# Patient Record
Sex: Female | Born: 1970
Health system: Southern US, Community
[De-identification: ages and names within clinical notes are randomized; demographics above are authoritative.]

## PROBLEM LIST (undated history)

## (undated) DIAGNOSIS — J45909 Unspecified asthma, uncomplicated: Secondary | ICD-10-CM

## (undated) DIAGNOSIS — M65342 Trigger finger, left ring finger: Secondary | ICD-10-CM

## (undated) DIAGNOSIS — J4520 Mild intermittent asthma, uncomplicated: Secondary | ICD-10-CM

## (undated) DIAGNOSIS — G5601 Carpal tunnel syndrome, right upper limb: Secondary | ICD-10-CM

## (undated) DIAGNOSIS — H6990 Unspecified Eustachian tube disorder, unspecified ear: Secondary | ICD-10-CM

## (undated) DIAGNOSIS — G5602 Carpal tunnel syndrome, left upper limb: Secondary | ICD-10-CM

## (undated) DIAGNOSIS — E039 Hypothyroidism, unspecified: Secondary | ICD-10-CM

## (undated) DIAGNOSIS — L309 Dermatitis, unspecified: Secondary | ICD-10-CM

## (undated) DIAGNOSIS — F419 Anxiety disorder, unspecified: Secondary | ICD-10-CM

## (undated) HISTORY — DX: Mild intermittent asthma, uncomplicated: J45.20

## (undated) HISTORY — DX: Carpal tunnel syndrome, right upper limb: G56.01

## (undated) HISTORY — PX: WISDOM TOOTH EXTRACTION: SHX21

## (undated) HISTORY — DX: Unspecified asthma, uncomplicated: J45.909

## (undated) HISTORY — DX: Dermatitis, unspecified: L30.9

## (undated) HISTORY — DX: Unspecified eustachian tube disorder, unspecified ear: H69.90

## (undated) HISTORY — DX: Anxiety disorder, unspecified: F41.9

## (undated) HISTORY — DX: Carpal tunnel syndrome, left upper limb: G56.02

## (undated) HISTORY — DX: Trigger finger, left ring finger: M65.342

---

## 2011-05-19 ENCOUNTER — Ambulatory Visit: Payer: Self-pay | Admitting: Orthopedic Surgery

## 2014-10-30 LAB — HM PAP SMEAR: HM PAP: NEGATIVE

## 2014-11-10 ENCOUNTER — Ambulatory Visit: Payer: Self-pay | Admitting: Family Medicine

## 2014-11-10 LAB — HM MAMMOGRAPHY: HM Mammogram: NORMAL (ref 0–4)

## 2016-04-03 DIAGNOSIS — J309 Allergic rhinitis, unspecified: Secondary | ICD-10-CM | POA: Insufficient documentation

## 2016-04-03 DIAGNOSIS — R000 Tachycardia, unspecified: Secondary | ICD-10-CM | POA: Insufficient documentation

## 2016-04-03 DIAGNOSIS — L309 Dermatitis, unspecified: Secondary | ICD-10-CM | POA: Insufficient documentation

## 2016-04-03 DIAGNOSIS — F410 Panic disorder [episodic paroxysmal anxiety] without agoraphobia: Secondary | ICD-10-CM | POA: Insufficient documentation

## 2016-04-05 ENCOUNTER — Ambulatory Visit: Payer: Self-pay | Admitting: Unknown Physician Specialty

## 2016-04-05 ENCOUNTER — Telehealth: Payer: Self-pay | Admitting: Family Medicine

## 2016-04-05 NOTE — Telephone Encounter (Signed)
Routing to provider  

## 2016-04-05 NOTE — Telephone Encounter (Signed)
Patient notified, she said she didn't want to go to urgent care or the ed. I offered her an appointment with Dr. Wynetta Emery for next week but she refused and hung up on me.

## 2016-04-05 NOTE — Telephone Encounter (Signed)
Patient called thinking she is having anxiety and cant calm down. Told patient that there isn't anything available today but she insisted for me to send message to Dr. Wynetta Emery so she can work her in also give me a call [phone], thanks.

## 2016-04-05 NOTE — Telephone Encounter (Signed)
Spoke with pt who called to let us know she went to urgent care and they sent her to cardiology and Dr Saralyn Pilar gave her alprazolam .25mg  to take once a day for sleep.

## 2016-04-05 NOTE — Telephone Encounter (Signed)
I have not seen her since 10/2014- I don't think I have anything today, we can try to work her in as able. If she is that anxious, she should probably go to an urgent care as we can't give her anything here anyway, we can only give Rxs.

## 2016-04-06 ENCOUNTER — Ambulatory Visit: Payer: Self-pay | Admitting: Family Medicine

## 2016-04-10 ENCOUNTER — Ambulatory Visit (INDEPENDENT_AMBULATORY_CARE_PROVIDER_SITE_OTHER): Payer: BLUE CROSS/BLUE SHIELD | Admitting: Family Medicine

## 2016-04-10 ENCOUNTER — Encounter: Payer: Self-pay | Admitting: Family Medicine

## 2016-04-10 VITALS — BP 135/81 | HR 108 | Temp 98.9°F | Ht 60.25 in | Wt 159.0 lb

## 2016-04-10 DIAGNOSIS — F419 Anxiety disorder, unspecified: Secondary | ICD-10-CM | POA: Insufficient documentation

## 2016-04-10 DIAGNOSIS — F410 Panic disorder [episodic paroxysmal anxiety] without agoraphobia: Secondary | ICD-10-CM

## 2016-04-10 MED ORDER — ALPRAZOLAM 0.25 MG PO TABS: ORAL_TABLET | ORAL | Status: DC

## 2016-04-10 MED ORDER — VENLAFAXINE HCL ER 37.5 MG PO CP24: 37.5000 mg | ORAL_CAPSULE | Freq: Every day | ORAL | Status: DC

## 2016-04-10 NOTE — Progress Notes (Signed)
BP 135/81 mmHg  Pulse 108  Temp(Src) 98.9 F (37.2 C)  Ht 5' 0.25" (1.53 m)  Wt 159 lb (72.122 kg)  BMI 30.81 kg/m2  SpO2 100%  LMP 03/20/2016 (Exact Date)   Subjective:    Patient ID: Sherry Gonzalez, female    DOB: October 18, 1971, 45 y.o.   MRN: [id number]  HPI: Sherry Gonzalez is a 45 y.o. female  Chief Complaint  Patient presents with  . Follow-up  . Panic Attack    2 panic attacks in the last 2 weeks   URGENT CARE FOLLOW UP Time since discharge: 12 days Hospital/facility: [facility] Diagnosis: Presyncope, tachycardia, anxiety Procedures/tests: EKG- short PR interval, Stress test and Holter monitor Consultants: Cardiology New medications: xanax Discharge instructions:  Follow up with cardiology in 2 weeks after Holter and Stress test Status: better  ANXIETY/STRESS- panic attacks that last about 30 seconds and her heart rate goes into the 90s with lightheadedness and elevated HR. Has been undergoing studies with cardiology including stress test and holter, was given xanax by cardiology which she says helped a lot, but it was very strong for her. Taking it very occasionally. Thinks that the fitbit she was wearing might have gotten her more anxious and made her feel worse. Has been living with her brother and that has been stressing her out quite a bit. Had another panic attack when she put the fit bit back on.  Duration: 2 weeks Anxious mood: yes  Excessive worrying: yes Irritability: no  Sweating: no Nausea: no Palpitations:yes Hyperventilation: yes Panic attacks: yes Agoraphobia: no  Obscessions/compulsions: no Depressed mood: no Depression screen [facility] 2/9 04/10/2016  Decreased Interest 0  Down, Depressed, Hopeless 0  PHQ - 2 Score 0   GAD 7 : Generalized Anxiety Score 04/10/2016  Nervous, Anxious, on Edge 3  Control/stop worrying 2  Worry too much - different things 2  Trouble relaxing 2  Restless 0  Easily annoyed or irritable 2  Afraid - awful might  happen 2  Total GAD 7 Score 13  Anxiety Difficulty Somewhat difficult   Anhedonia: no Weight changes: no Insomnia: yes hard to fall asleep  Hypersomnia: no Fatigue/loss of energy: yes Feelings of worthlessness: no Feelings of guilt: no Impaired concentration/indecisiveness: no Suicidal ideations: no  Crying spells: yes Recent Stressors/Life Changes: yes   Relationship problems: no   Family stress: yes     Financial stress: no    Job stress: yes    Recent death/loss: no  Relevant past medical, surgical, family and social history reviewed and updated as indicated. Interim medical history since our last visit reviewed. Allergies and medications reviewed and updated.  Review of Systems  Constitutional: Negative.   Respiratory: Negative.   Cardiovascular: Negative.   Musculoskeletal: Negative.   Psychiatric/Behavioral: Positive for confusion, sleep disturbance and agitation. Negative for suicidal ideas, hallucinations, behavioral problems, self-injury, dysphoric mood and decreased concentration. The patient is nervous/anxious. The patient is not hyperactive.     Per HPI unless specifically indicated above     Objective:    BP 135/81 mmHg  Pulse 108  Temp(Src) 98.9 F (37.2 C)  Ht 5' 0.25" (1.53 m)  Wt 159 lb (72.122 kg)  BMI 30.81 kg/m2  SpO2 100%  LMP 03/20/2016 (Exact Date)  Wt Readings from Last 3 Encounters:  04/10/16 159 lb (72.122 kg)  10/30/14 175 lb (79.379 kg)    Physical Exam  Constitutional: She is oriented to person, place, and time. She appears well-developed and well-nourished. No  distress.  HENT:  Head: Normocephalic and atraumatic.  Right Ear: Hearing normal.  Left Ear: Hearing normal.  Nose: Nose normal.  Eyes: Conjunctivae and lids are normal. Right eye exhibits no discharge. Left eye exhibits no discharge. No scleral icterus.  Cardiovascular: Normal rate, regular rhythm, normal heart sounds and intact distal pulses.  Exam reveals no gallop and  no friction rub.   No murmur heard. Pulmonary/Chest: Effort normal and breath sounds normal. No respiratory distress. She has no wheezes. She has no rales. She exhibits no tenderness.  Musculoskeletal: Normal range of motion.  Neurological: She is alert and oriented to person, place, and time.  Skin: Skin is warm, dry and intact. No rash noted. No erythema. No pallor.  Psychiatric: She has a normal mood and affect. Her speech is normal and behavior is normal. Judgment and thought content normal. Cognition and memory are normal.  Nursing note and vitals reviewed.   Results for orders placed or performed in visit on 03/30/16  HM MAMMOGRAPHY  Result Value Ref Range   HM Mammogram Self Reported Normal 0-4 Bi-Rad, Self Reported Normal  HM PAP SMEAR  Result Value Ref Range   HM Pap smear neg HPV       Assessment & Plan:   Problem List Items Addressed This Visit      Other   Anxiety disorder - Primary    She doesn't really like how the xanax makes her feel. Will start her on effexor for anxiety- low dose to start and recheck in 4 weeks. Rx for xanax given today to get her through the next month. Will use it as a bridge until better controlled with the effexor. Call with any concerns.           Follow up plan: Return in about 4 weeks (around 05/08/2016).

## 2016-04-10 NOTE — Assessment & Plan Note (Signed)
She doesn't really like how the xanax makes her feel. Will start her on effexor for anxiety- low dose to start and recheck in 4 weeks. Rx for xanax given today to get her through the next month. Will use it as a bridge until better controlled with the effexor. Call with any concerns.

## 2016-04-12 ENCOUNTER — Telehealth: Payer: Self-pay | Admitting: Family Medicine

## 2016-04-12 NOTE — Telephone Encounter (Signed)
Routing to provider for advice.

## 2016-04-12 NOTE — Telephone Encounter (Signed)
It can be the first couple of days, every one responds differently. Go ahead and take it in the AM. Thanks.

## 2016-04-12 NOTE — Telephone Encounter (Signed)
Called patient with Dr. Durenda Age recommendation to start taking in the morning. Patient will try that and let us know if not tolerated after a couple of weeks.

## 2016-04-12 NOTE — Telephone Encounter (Signed)
Pt called would like a call back from Dr. Wynetta Emery concerning a medication the pt is on (Venlafaxine). Please call pt has questions about directions. Pt stated she has been taking the medication at night and it is wiring her up. Wants to know if this is normal?

## 2016-04-14 ENCOUNTER — Telehealth: Payer: Self-pay

## 2016-04-14 MED ORDER — VENLAFAXINE HCL 25 MG PO TABS: ORAL_TABLET | ORAL | Status: DC

## 2016-04-14 NOTE — Telephone Encounter (Signed)
Patient called, she has been taking the Venlafaxine in the morning for the past two days, she is still unable to sleep at night. She would like advice, should she continue to take it and see what happens or if there is something else that she can be put on?  [facility]

## 2016-04-14 NOTE — Telephone Encounter (Signed)
Stop that one. i've sent through a lower dose, but she will need to take it 2x a day. If it's still not letting her sleep, let me know and we'll change to something else.

## 2016-04-18 ENCOUNTER — Telehealth: Payer: Self-pay | Admitting: Family Medicine

## 2016-04-18 MED ORDER — SERTRALINE HCL 50 MG PO TABS: ORAL_TABLET | ORAL | Status: DC

## 2016-04-18 NOTE — Telephone Encounter (Signed)
I sent through zoloft to her pharmacy. Let me know if she has any problems with it.

## 2016-04-18 NOTE — Telephone Encounter (Signed)
Pt returning call

## 2016-04-18 NOTE — Telephone Encounter (Signed)
Patient notified

## 2016-04-18 NOTE — Telephone Encounter (Signed)
Patient is concerned that she is still not going to sleep, she would like to know if she could try Zoloft?

## 2016-05-02 ENCOUNTER — Encounter: Payer: Self-pay | Admitting: Family Medicine

## 2016-05-02 ENCOUNTER — Ambulatory Visit (INDEPENDENT_AMBULATORY_CARE_PROVIDER_SITE_OTHER): Payer: BLUE CROSS/BLUE SHIELD | Admitting: Family Medicine

## 2016-05-02 VITALS — BP 143/84 | HR 111 | Temp 97.8°F | Wt 156.0 lb

## 2016-05-02 DIAGNOSIS — H9192 Unspecified hearing loss, left ear: Secondary | ICD-10-CM | POA: Insufficient documentation

## 2016-05-02 DIAGNOSIS — F410 Panic disorder [episodic paroxysmal anxiety] without agoraphobia: Secondary | ICD-10-CM | POA: Diagnosis not present

## 2016-05-02 DIAGNOSIS — R030 Elevated blood-pressure reading, without diagnosis of hypertension: Secondary | ICD-10-CM | POA: Diagnosis not present

## 2016-05-02 DIAGNOSIS — IMO0001 Reserved for inherently not codable concepts without codable children: Secondary | ICD-10-CM

## 2016-05-02 NOTE — Assessment & Plan Note (Signed)
Will refer to ENT. Referral generated today.

## 2016-05-02 NOTE — Assessment & Plan Note (Signed)
Still not doing well. Becoming very anxious with medications, concerned about side effects. Will continue her on PRN low dose xanax a this time and will get her into a counselor. Hold on medicine at this time. Will continue to monitor closely.

## 2016-05-02 NOTE — Progress Notes (Signed)
BP 143/84 mmHg  Pulse 111  Temp(Src) 97.8 F (36.6 C)  Wt 156 lb (70.761 kg)  SpO2 100%  LMP 04/20/2016 (Approximate)   Subjective:    Patient ID: Sherry Gonzalez, female    DOB: 10/30/1971, 45 y.o.   MRN: [id number]  HPI: Sherry Gonzalez is a 45 y.o. female  Chief Complaint  Patient presents with  . Anxiety    Patient states that she has been having bad side effects from Effexor and Zoloft, nausea, sweats, dizziness,SOB  . Hearing Problem    Patient states that she is still having trouble with her left ear, not being able to hear very well.   ANXIETY/STRESS- did not tolerate the zoloft. States that it made her anxious and nauseous ans gave her sweats and dizzy. She took 2 doses and did not repeat it. When she was taking the effexor it wired her up and made her heart race and her unable to sleep. No better with taking it in the AM or taking a lower dose. She took the 25mg  of the zoloft when she took it. Has not been taking any medication at this time besides her xanax Duration:uncontrolled Anxious mood: yes  Excessive worrying: yes Irritability: yes  Sweating: yes Nausea: yes Palpitations:yes Hyperventilation: no Panic attacks: no Agoraphobia: no  Obscessions/compulsions: no Depressed mood: no Depression screen [facility] 2/9 05/02/2016 04/10/2016  Decreased Interest 0 0  Down, Depressed, Hopeless 0 0  PHQ - 2 Score 0 0  Altered sleeping 3 -  Tired, decreased energy 3 -  Change in appetite 2 -  Feeling bad or failure about yourself  0 -  Trouble concentrating 0 -  Moving slowly or fidgety/restless 0 -  Suicidal thoughts 0 -  PHQ-9 Score 8 -  Difficult doing work/chores Somewhat difficult -   GAD 7 : Generalized Anxiety Score 05/02/2016 04/10/2016  Nervous, Anxious, on Edge 3 3  Control/stop worrying 0 2  Worry too much - different things 0 2  Trouble relaxing 2 2  Restless 0 0  Easily annoyed or irritable 0 2  Afraid - awful might happen 2 2  Total GAD 7 Score 7 13  Anxiety  Difficulty Very difficult Somewhat difficult   Anhedonia: yes Weight changes: no Insomnia: no hard to stay asleep  Hypersomnia: no Fatigue/loss of energy: no Feelings of worthlessness: no Feelings of guilt: no Impaired concentration/indecisiveness: no Suicidal ideations: no  Crying spells: no Recent Stressors/Life Changes: no   Relationship problems: no   Family stress: no     Financial stress: no    Job stress: no    Recent death/loss: no  Has not been able to hear out of her L ear since 2011, but has not gotten any better and she would like to get it taken care of and checked out. Had history of cerumen impaction. Has history of tubes in her ears and repeated ear infections in the past. Saw ENT 4-5 years ago, and it was OK at that time. Feels clogged most of the time. Hasn't be able to get it to pop. Is wondering if this is what's causing some of her dizziness.   Relevant past medical, surgical, family and social history reviewed and updated as indicated. Interim medical history since our last visit reviewed. Allergies and medications reviewed and updated.  Review of Systems  Constitutional: Negative.   HENT: Positive for hearing loss. Negative for congestion, dental problem, drooling, ear discharge, ear pain, facial swelling, mouth sores, nosebleeds, postnasal drip, rhinorrhea, sinus  pressure, sneezing, sore throat, tinnitus, trouble swallowing and voice change.   Respiratory: Negative.   Cardiovascular: Negative.   Psychiatric/Behavioral: Positive for sleep disturbance and agitation. Negative for suicidal ideas, hallucinations, behavioral problems, confusion, self-injury, dysphoric mood and decreased concentration. The patient is nervous/anxious. The patient is not hyperactive.    Per HPI unless specifically indicated above     Objective:    BP 143/84 mmHg  Pulse 111  Temp(Src) 97.8 F (36.6 C)  Wt 156 lb (70.761 kg)  SpO2 100%  LMP 04/20/2016 (Approximate)  Wt Readings  from Last 3 Encounters:  05/02/16 156 lb (70.761 kg)  04/10/16 159 lb (72.122 kg)  10/30/14 175 lb (79.379 kg)     Hearing Screening   125Hz  250Hz  500Hz  1000Hz  2000Hz  4000Hz  8000Hz   Right ear:   Pass Pass Pass Pass   Left ear:   Fail Fail Fail Fail    Physical Exam  Constitutional: She is oriented to person, place, and time. She appears well-developed and well-nourished. No distress.  HENT:  Head: Normocephalic and atraumatic.  Right Ear: Hearing normal.  Left Ear: Hearing normal.  Nose: Nose normal.  Eyes: Conjunctivae and lids are normal. Right eye exhibits no discharge. Left eye exhibits no discharge. No scleral icterus.  Cardiovascular: Normal rate, regular rhythm, normal heart sounds and intact distal pulses.  Exam reveals no gallop and no friction rub.   No murmur heard. Pulmonary/Chest: Effort normal and breath sounds normal. No respiratory distress. She has no wheezes. She has no rales. She exhibits no tenderness.  Musculoskeletal: Normal range of motion.  Neurological: She is alert and oriented to person, place, and time.  Skin: Skin is warm, dry and intact. No rash noted. No erythema. No pallor.  Psychiatric: Her speech is normal and behavior is normal. Judgment and thought content normal. Her mood appears anxious. Cognition and memory are normal.  Nursing note and vitals reviewed.   Results for orders placed or performed in visit on 03/30/16  HM MAMMOGRAPHY  Result Value Ref Range   HM Mammogram Self Reported Normal 0-4 Bi-Rad, Self Reported Normal  HM PAP SMEAR  Result Value Ref Range   HM Pap smear neg HPV       Assessment & Plan:   Problem List Items Addressed This Visit      Other   Anxiety disorder - Primary    Still not doing well. Becoming very anxious with medications, concerned about side effects. Will continue her on PRN low dose xanax a this time and will get her into a counselor. Hold on medicine at this time. Will continue to monitor closely.        Hearing loss in left ear    Will refer to ENT. Referral generated today.      Relevant Orders   Ambulatory referral to ENT    Other Visit Diagnoses    Elevated BP        Anxious today. Work on breathing. Treat anxiety. Call with any concerns.         Follow up plan: Return 3-4 weeks, for Follow up on mood.

## 2016-05-08 ENCOUNTER — Ambulatory Visit: Payer: BLUE CROSS/BLUE SHIELD | Admitting: Family Medicine

## 2016-05-22 ENCOUNTER — Other Ambulatory Visit: Payer: Self-pay | Admitting: Family Medicine

## 2016-05-22 NOTE — Telephone Encounter (Signed)
RX called in .

## 2016-05-22 NOTE — Telephone Encounter (Signed)
Please call in [place], Thanks!

## 2016-05-29 ENCOUNTER — Ambulatory Visit (INDEPENDENT_AMBULATORY_CARE_PROVIDER_SITE_OTHER): Payer: BLUE CROSS/BLUE SHIELD | Admitting: Family Medicine

## 2016-05-29 ENCOUNTER — Encounter: Payer: Self-pay | Admitting: Family Medicine

## 2016-05-29 VITALS — BP 131/75 | HR 108 | Temp 97.9°F | Ht 60.25 in | Wt 157.0 lb

## 2016-05-29 DIAGNOSIS — F410 Panic disorder [episodic paroxysmal anxiety] without agoraphobia: Secondary | ICD-10-CM | POA: Diagnosis not present

## 2016-05-29 NOTE — Progress Notes (Signed)
BP 131/75 mmHg  Pulse 108  Temp(Src) 97.9 F (36.6 C)  Ht 5' 0.25" (1.53 m)  Wt 157 lb (71.215 kg)  BMI 30.42 kg/m2  SpO2 97%  LMP 05/19/2016 (Approximate)   Subjective:    Patient ID: Sherry Gonzalez, female    DOB: 1971/03/02, 45 y.o.   MRN: [id number]  HPI: Sherry Gonzalez is a 45 y.o. female  Chief Complaint  Patient presents with  . Depression   ANXIETY/STRESS- has not seen anyone yet. Going to check and see who she wants to see and will give them a try.  Duration:better but still not under control wants to see counselor Anxious mood: yes  Excessive worrying: yes Irritability: no  Sweating: no Nausea: no Palpitations:no Hyperventilation: no Panic attacks: yes Agoraphobia: no  Obscessions/compulsions: no Depressed mood: no Depression screen [facility] 2/9 05/29/2016 05/02/2016 04/10/2016  Decreased Interest 0 0 0  Down, Depressed, Hopeless 0 0 0  PHQ - 2 Score 0 0 0  Altered sleeping 1 3 -  Tired, decreased energy 0 3 -  Change in appetite 0 2 -  Feeling bad or failure about yourself  0 0 -  Trouble concentrating 0 0 -  Moving slowly or fidgety/restless 0 0 -  Suicidal thoughts 0 0 -  PHQ-9 Score 1 8 -  Difficult doing work/chores Somewhat difficult Somewhat difficult -   GAD 7 : Generalized Anxiety Score 05/29/2016 05/02/2016 04/10/2016  Nervous, Anxious, on Edge 2 3 3   Control/stop worrying 0 0 2  Worry too much - different things 0 0 2  Trouble relaxing 1 2 2   Restless 0 0 0  Easily annoyed or irritable 0 0 2  Afraid - awful might happen 1 2 2   Total GAD 7 Score 4 7 13   Anxiety Difficulty Not difficult at all Very difficult Somewhat difficult   Anhedonia: no Weight changes: no Insomnia: yes hard to fall asleep  Hypersomnia: no Fatigue/loss of energy: yes Feelings of worthlessness: no Feelings of guilt: no Impaired concentration/indecisiveness: no Suicidal ideations: no  Crying spells: no Recent Stressors/Life Changes: no   Relationship problems: no   Family  stress: yes     Financial stress: no    Job stress: yes    Recent death/loss: no  Relevant past medical, surgical, family and social history reviewed and updated as indicated. Interim medical history since our last visit reviewed. Allergies and medications reviewed and updated.  Review of Systems  Constitutional: Negative.   Respiratory: Negative.   Cardiovascular: Negative.   Psychiatric/Behavioral: Positive for sleep disturbance. Negative for suicidal ideas, hallucinations, behavioral problems, confusion, self-injury, dysphoric mood, decreased concentration and agitation. The patient is nervous/anxious. The patient is not hyperactive.    Per HPI unless specifically indicated above     Objective:    BP 131/75 mmHg  Pulse 108  Temp(Src) 97.9 F (36.6 C)  Ht 5' 0.25" (1.53 m)  Wt 157 lb (71.215 kg)  BMI 30.42 kg/m2  SpO2 97%  LMP 05/19/2016 (Approximate)  Wt Readings from Last 3 Encounters:  05/29/16 157 lb (71.215 kg)  05/02/16 156 lb (70.761 kg)  04/10/16 159 lb (72.122 kg)    Physical Exam  Constitutional: She is oriented to person, place, and time. She appears well-developed and well-nourished. No distress.  HENT:  Head: Normocephalic and atraumatic.  Right Ear: Hearing normal.  Left Ear: Hearing normal.  Nose: Nose normal.  Eyes: Conjunctivae and lids are normal. Right eye exhibits no discharge. Left eye exhibits no discharge. No scleral  icterus.  Cardiovascular: Normal rate, regular rhythm, normal heart sounds and intact distal pulses.  Exam reveals no gallop and no friction rub.   No murmur heard. Pulmonary/Chest: Effort normal and breath sounds normal. No respiratory distress. She has no wheezes. She has no rales. She exhibits no tenderness.  Musculoskeletal: Normal range of motion.  Neurological: She is alert and oriented to person, place, and time.  Skin: Skin is warm, dry and intact. No rash noted. No erythema. No pallor.  Psychiatric: Her speech is normal and  behavior is normal. Judgment and thought content normal. Her mood appears anxious. Cognition and memory are normal.  Nursing note and vitals reviewed.   Results for orders placed or performed in visit on 03/30/16  HM MAMMOGRAPHY  Result Value Ref Range   HM Mammogram Self Reported Normal 0-4 Bi-Rad, Self Reported Normal  HM PAP SMEAR  Result Value Ref Range   HM Pap smear neg HPV       Assessment & Plan:   Problem List Items Addressed This Visit      Other   Anxiety disorder - Primary    Doing better than she was, but still not right. Will continue current regimen at this time and will get her into her counselor. Will recheck in 1 month.           Follow up plan: Return in about 4 weeks (around 06/26/2016) for Follow up on anxiety.

## 2016-05-29 NOTE — Patient Instructions (Signed)

## 2016-05-29 NOTE — Assessment & Plan Note (Signed)
Doing better than she was, but still not right. Will continue current regimen at this time and will get her into her counselor. Will recheck in 1 month.

## 2016-06-28 ENCOUNTER — Telehealth: Payer: Self-pay | Admitting: Family Medicine

## 2016-06-28 NOTE — Telephone Encounter (Signed)
Called pt to reschedule appt for Friday 06/30/16. Pt stated she needs a refill on Xanax. [facility] [place]. Thanks.

## 2016-06-28 NOTE — Telephone Encounter (Signed)
Routing to provider  

## 2016-06-29 MED ORDER — ALPRAZOLAM 0.25 MG PO TABS
ORAL_TABLET | ORAL | 0 refills | Status: DC
Start: 2016-06-29 — End: 2016-08-18

## 2016-06-29 NOTE — Telephone Encounter (Signed)
OK to call in. Thanks! 

## 2016-06-29 NOTE — Telephone Encounter (Signed)
Called and let patient know rx was sent in.  

## 2016-06-29 NOTE — Telephone Encounter (Signed)
Called rx into [facility].

## 2016-06-30 ENCOUNTER — Ambulatory Visit: Payer: BLUE CROSS/BLUE SHIELD | Admitting: Family Medicine

## 2016-07-07 ENCOUNTER — Encounter: Payer: Self-pay | Admitting: Family Medicine

## 2016-07-07 ENCOUNTER — Ambulatory Visit (INDEPENDENT_AMBULATORY_CARE_PROVIDER_SITE_OTHER): Payer: BLUE CROSS/BLUE SHIELD | Admitting: Family Medicine

## 2016-07-07 VITALS — BP 128/78 | HR 114 | Temp 98.6°F | Wt 155.0 lb

## 2016-07-07 DIAGNOSIS — Z13220 Encounter for screening for lipoid disorders: Secondary | ICD-10-CM | POA: Diagnosis not present

## 2016-07-07 DIAGNOSIS — R202 Paresthesia of skin: Secondary | ICD-10-CM

## 2016-07-07 DIAGNOSIS — R82998 Other abnormal findings in urine: Secondary | ICD-10-CM

## 2016-07-07 DIAGNOSIS — F410 Panic disorder [episodic paroxysmal anxiety] without agoraphobia: Secondary | ICD-10-CM

## 2016-07-07 DIAGNOSIS — R8299 Other abnormal findings in urine: Secondary | ICD-10-CM | POA: Diagnosis not present

## 2016-07-07 LAB — UA/M W/RFLX CULTURE, ROUTINE
Bilirubin, UA: NEGATIVE
GLUCOSE, UA: NEGATIVE
Ketones, UA: NEGATIVE
Leukocytes, UA: NEGATIVE
NITRITE UA: NEGATIVE
Protein, UA: NEGATIVE
Specific Gravity, UA: 1.015 (ref 1.005–1.030)
UUROB: 0.2 mg/dL (ref 0.2–1.0)
pH, UA: 5.5 (ref 5.0–7.5)

## 2016-07-07 LAB — CBC WITH DIFFERENTIAL/PLATELET
Hematocrit: 39.1 % (ref 34.0–46.6)
Hemoglobin: 14 g/dL (ref 11.1–15.9)
LYMPHS ABS: 1.1 10*3/uL (ref 0.7–3.1)
LYMPHS: 17 %
MCH: 31.9 pg (ref 26.6–33.0)
MCHC: 35.8 g/dL — AB (ref 31.5–35.7)
MCV: 89 fL (ref 79–97)
MID (ABSOLUTE): 0.7 10*3/uL (ref 0.1–1.6)
MID: 12 %
NEUTROS ABS: 4.3 10*3/uL (ref 1.4–7.0)
NEUTROS PCT: 71 %
PLATELETS: 277 10*3/uL (ref 150–379)
RBC: 4.39 x10E6/uL (ref 3.77–5.28)
RDW: 12.4 % (ref 12.3–15.4)
WBC: 6.1 10*3/uL (ref 3.4–10.8)

## 2016-07-07 LAB — MICROSCOPIC EXAMINATION

## 2016-07-07 LAB — BAYER DCA HB A1C WAIVED: HB A1C (BAYER DCA - WAIVED): 5.2 % (ref <7.0)

## 2016-07-07 NOTE — Assessment & Plan Note (Signed)
Medicine not working quite as strongly. Working with a Social worker. Will start decreasing how often she takes the medicine and only take it when she really needs it.

## 2016-07-07 NOTE — Progress Notes (Signed)
BP 128/78 (BP Location: Left Arm, Patient Position: Sitting, Cuff Size: Normal)   Pulse (!) 114   Temp 98.6 F (37 C)   Wt 155 lb (70.3 kg)   LMP 06/20/2016 (Approximate)   SpO2 100%   BMI 30.02 kg/m    Subjective:    Patient ID: Sherry Gonzalez, female    DOB: [birth year], 45 y.o.   MRN: [id number]  HPI: Sherry Gonzalez is a 45 y.o. female  Chief Complaint  Patient presents with  . Anxiety   Hands started tingling at night and would go away with shaking, but made her nervous  ANXIETY/STRESS Duration:stable Anxious mood: yes  Excessive worrying: yes Irritability: no  Sweating: no Nausea: no Palpitations:no Hyperventilation: no Panic attacks: no Agoraphobia: no  Obscessions/compulsions: no Depressed mood: no Depression screen [facility] 2/9 05/29/2016 05/02/2016 04/10/2016  Decreased Interest 0 0 0  Down, Depressed, Hopeless 0 0 0  PHQ - 2 Score 0 0 0  Altered sleeping 1 3 -  Tired, decreased energy 0 3 -  Change in appetite 0 2 -  Feeling bad or failure about yourself  0 0 -  Trouble concentrating 0 0 -  Moving slowly or fidgety/restless 0 0 -  Suicidal thoughts 0 0 -  PHQ-9 Score 1 8 -  Difficult doing work/chores Somewhat difficult Somewhat difficult -   GAD 7 : Generalized Anxiety Score 07/07/2016 05/29/2016 05/02/2016 04/10/2016  Nervous, Anxious, on Edge 3 2 3 3   Control/stop worrying 2 0 0 2  Worry too much - different things 0 0 0 2  Trouble relaxing 1 1 2 2   Restless 0 0 0 0  Easily annoyed or irritable 0 0 0 2  Afraid - awful might happen 1 1 2 2   Total GAD 7 Score 7 4 7 13   Anxiety Difficulty Not difficult at all Not difficult at all Very difficult Somewhat difficult   Anhedonia: no Weight changes: no Insomnia: yes hard to fall asleep  Hypersomnia: no Fatigue/loss of energy: no Feelings of worthlessness: no Feelings of guilt: no Impaired concentration/indecisiveness: no Suicidal ideations: no  Crying spells: no Recent Stressors/Life Changes: no  Relevant  past medical, surgical, family and social history reviewed and updated as indicated. Interim medical history since our last visit reviewed. Allergies and medications reviewed and updated.  Review of Systems  Constitutional: Negative.   Respiratory: Negative.   Cardiovascular: Negative.   Neurological: Negative for dizziness, tremors, seizures, syncope, facial asymmetry, speech difficulty, weakness, light-headedness, numbness and headaches.       Tingling in her feet every now and then and heat in her feet  Psychiatric/Behavioral: Positive for sleep disturbance. Negative for agitation, behavioral problems, confusion, decreased concentration, dysphoric mood, hallucinations, self-injury and suicidal ideas. The patient is nervous/anxious. The patient is not hyperactive.     Per HPI unless specifically indicated above     Objective:    BP 128/78 (BP Location: Left Arm, Patient Position: Sitting, Cuff Size: Normal)   Pulse (!) 114   Temp 98.6 F (37 C)   Wt 155 lb (70.3 kg)   LMP 06/20/2016 (Approximate)   SpO2 100%   BMI 30.02 kg/m   Wt Readings from Last 3 Encounters:  07/07/16 155 lb (70.3 kg)  05/29/16 157 lb (71.2 kg)  05/02/16 156 lb (70.8 kg)    Physical Exam  Constitutional: She is oriented to person, place, and time. She appears well-developed and well-nourished. No distress.  HENT:  Head: Normocephalic and atraumatic.  Right Ear: Hearing  normal.  Left Ear: Hearing normal.  Nose: Nose normal.  Eyes: Conjunctivae and lids are normal. Right eye exhibits no discharge. Left eye exhibits no discharge. No scleral icterus.  Cardiovascular: Normal rate, regular rhythm, normal heart sounds and intact distal pulses.  Exam reveals no gallop and no friction rub.   No murmur heard. Pulmonary/Chest: Effort normal and breath sounds normal. No respiratory distress. She has no wheezes. She has no rales. She exhibits no tenderness.  Musculoskeletal: Normal range of motion.  Neurological:  She is alert and oriented to person, place, and time. She has normal reflexes. She displays normal reflexes. No cranial nerve deficit. She exhibits normal muscle tone. Coordination normal.  Skin: Skin is warm, dry and intact. No rash noted. No erythema. No pallor.  Psychiatric: She has a normal mood and affect. Her speech is normal and behavior is normal. Judgment and thought content normal. Cognition and memory are normal.  Nursing note and vitals reviewed.   Results for orders placed or performed in visit on 03/30/16  HM MAMMOGRAPHY  Result Value Ref Range   HM Mammogram Self Reported Normal 0-4 Bi-Rad, Self Reported Normal  HM PAP SMEAR  Result Value Ref Range   HM Pap smear neg HPV       Assessment & Plan:   Problem List Items Addressed This Visit      Other   Anxiety disorder - Primary    Medicine not working quite as strongly. Working with a Social worker. Will start decreasing how often she takes the medicine and only take it when she really needs it.        Other Visit Diagnoses    Paresthesias       Seems to be due to muscle hypertonicity and nerve irritation. Will check labs for other causes. Await results.    Relevant Orders   Bayer DCA Hb A1c Waived   CBC With Differential/Platelet   Comprehensive metabolic panel   TSH   VITAMIN D 25 Hydroxy (Vit-D Deficiency, Fractures)   Screening for cholesterol level       Checking labs today. Await results.    Relevant Orders   Lipid Panel w/o Chol/HDL Ratio   Dark urine       Checking labs today. Await results.    Relevant Orders   UA/M w/rflx Culture, Routine       Follow up plan: Return 1-2 months, for follow up nerves.

## 2016-07-08 LAB — COMPREHENSIVE METABOLIC PANEL
A/G RATIO: 1.5 (ref 1.2–2.2)
ALT: 10 IU/L (ref 0–32)
AST: 13 IU/L (ref 0–40)
Albumin: 4.4 g/dL (ref 3.5–5.5)
Alkaline Phosphatase: 53 IU/L (ref 39–117)
BUN/Creatinine Ratio: 20 (ref 9–23)
BUN: 16 mg/dL (ref 6–24)
Bilirubin Total: 1.1 mg/dL (ref 0.0–1.2)
CHLORIDE: 100 mmol/L (ref 96–106)
CO2: 23 mmol/L (ref 18–29)
Calcium: 9.3 mg/dL (ref 8.7–10.2)
Creatinine, Ser: 0.81 mg/dL (ref 0.57–1.00)
GFR calc Af Amer: 103 mL/min/{1.73_m2} (ref >59)
GFR calc non Af Amer: 89 mL/min/{1.73_m2} (ref >59)
GLUCOSE: 87 mg/dL (ref 65–99)
Globulin, Total: 2.9 g/dL (ref 1.5–4.5)
POTASSIUM: 4.2 mmol/L (ref 3.5–5.2)
Sodium: 139 mmol/L (ref 134–144)
Total Protein: 7.3 g/dL (ref 6.0–8.5)

## 2016-07-08 LAB — LIPID PANEL W/O CHOL/HDL RATIO
CHOLESTEROL TOTAL: 125 mg/dL (ref 100–199)
HDL: 51 mg/dL (ref >39)
LDL Calculated: 59 mg/dL (ref 0–99)
TRIGLYCERIDES: 75 mg/dL (ref 0–149)
VLDL CHOLESTEROL CAL: 15 mg/dL (ref 5–40)

## 2016-07-08 LAB — VITAMIN D 25 HYDROXY (VIT D DEFICIENCY, FRACTURES): VIT D 25 HYDROXY: 21.4 ng/mL — AB (ref 30.0–100.0)

## 2016-07-08 LAB — TSH: TSH: 3.14 u[IU]/mL (ref 0.450–4.500)

## 2016-07-10 ENCOUNTER — Encounter: Payer: Self-pay | Admitting: Family Medicine

## 2016-08-18 ENCOUNTER — Telehealth: Payer: Self-pay | Admitting: Family Medicine

## 2016-08-18 ENCOUNTER — Encounter: Payer: Self-pay | Admitting: Family Medicine

## 2016-08-18 MED ORDER — ALPRAZOLAM 0.25 MG PO TABS: ORAL_TABLET | ORAL | 2 refills | Status: DC

## 2016-08-18 NOTE — Telephone Encounter (Signed)
Called into pharmacy

## 2016-08-18 NOTE — Telephone Encounter (Signed)
OK to call in

## 2016-08-18 NOTE — Telephone Encounter (Signed)
Patient called needs a refill on Alprazolam. Pharm is [facility] in [place]. Thanks.

## 2016-08-25 ENCOUNTER — Ambulatory Visit (INDEPENDENT_AMBULATORY_CARE_PROVIDER_SITE_OTHER): Payer: BLUE CROSS/BLUE SHIELD | Admitting: Family Medicine

## 2016-08-25 ENCOUNTER — Encounter: Payer: Self-pay | Admitting: Family Medicine

## 2016-08-25 VITALS — BP 131/82 | HR 91 | Temp 99.0°F | Ht 60.5 in | Wt 153.2 lb

## 2016-08-25 DIAGNOSIS — F410 Panic disorder [episodic paroxysmal anxiety] without agoraphobia: Secondary | ICD-10-CM

## 2016-08-25 MED ORDER — MIRTAZAPINE 15 MG PO TABS: 15.0000 mg | ORAL_TABLET | Freq: Every day | ORAL | 1 refills | Status: DC

## 2016-08-25 NOTE — Assessment & Plan Note (Signed)
Doing OK but acting up a little bit more. Will start her on remeron to see if that helps. Call with any concerns. Recheck in 1 month.

## 2016-08-25 NOTE — Progress Notes (Signed)
BP 131/82 (BP Location: Right Arm, Patient Position: Sitting, Cuff Size: Normal)   Pulse 91   Temp 99 F (37.2 C)   Ht 5' 0.5" (1.537 m)   Wt 153 lb 3.2 oz (69.5 kg)   SpO2 100%   BMI 29.43 kg/m    Subjective:    Patient ID: Sherry Gonzalez, female    DOB: 10/21/1971, 45 y.o.   MRN: [id number]  HPI: Sherry Gonzalez is a 45 y.o. female  Chief Complaint  Patient presents with  . Follow-up   ANXIETY/STRESS- notes that she got very anxious when she got up at night and felt dizzy. Had been doing well until she had to go see the cardiologist and became really anxious with that and hasn't been feeling well since then. Has been seeing a counselor and that is helping, but she thinks that she might want to cut back on seeing her, thinking that she may want to see  Duration:exacerbated Anxious mood: yes  Excessive worrying: yes Irritability: no  Sweating: yes Nausea: yes Palpitations:yes Hyperventilation: no Panic attacks: yes Agoraphobia: no  Obscessions/compulsions: no Depressed mood: no Anhedonia: no Weight changes: no Insomnia: yes hard to fall asleep  Hypersomnia: no Fatigue/loss of energy: yes Feelings of worthlessness: no Feelings of guilt: no Impaired concentration/indecisiveness: no Suicidal ideations: no  Crying spells: no Recent Stressors/Life Changes: no  Relevant past medical, surgical, family and social history reviewed and updated as indicated. Interim medical history since our last visit reviewed. Allergies and medications reviewed and updated.  Review of Systems  Constitutional: Negative.   Respiratory: Negative.   Cardiovascular: Negative.   Psychiatric/Behavioral: Positive for sleep disturbance. Negative for agitation, behavioral problems, confusion, decreased concentration, dysphoric mood, hallucinations, self-injury and suicidal ideas. The patient is nervous/anxious. The patient is not hyperactive.     Per HPI unless specifically indicated above       Objective:    BP 131/82 (BP Location: Right Arm, Patient Position: Sitting, Cuff Size: Normal)   Pulse 91   Temp 99 F (37.2 C)   Ht 5' 0.5" (1.537 m)   Wt 153 lb 3.2 oz (69.5 kg)   SpO2 100%   BMI 29.43 kg/m   Wt Readings from Last 3 Encounters:  08/25/16 153 lb 3.2 oz (69.5 kg)  07/07/16 155 lb (70.3 kg)  05/29/16 157 lb (71.2 kg)    Physical Exam  Constitutional: She is oriented to person, place, and time. She appears well-developed and well-nourished. No distress.  HENT:  Head: Normocephalic and atraumatic.  Right Ear: Hearing normal.  Left Ear: Hearing normal.  Nose: Nose normal.  Eyes: Conjunctivae and lids are normal. Right eye exhibits no discharge. Left eye exhibits no discharge. No scleral icterus.  Cardiovascular: Normal rate, regular rhythm, normal heart sounds and intact distal pulses.  Exam reveals no gallop and no friction rub.   No murmur heard. Pulmonary/Chest: Effort normal and breath sounds normal. No respiratory distress. She has no wheezes. She has no rales. She exhibits no tenderness.  Musculoskeletal: Normal range of motion.  Neurological: She is alert and oriented to person, place, and time.  Skin: Skin is warm, dry and intact. No rash noted. No erythema. No pallor.  Psychiatric: She has a normal mood and affect. Her speech is normal and behavior is normal. Judgment and thought content normal. Cognition and memory are normal.  Nursing note and vitals reviewed.   Results for orders placed or performed in visit on 07/07/16  Microscopic Examination  Result Value Ref  Range   WBC, UA 0-5 0 - 5 /hpf   RBC, UA 0-2 0 - 2 /hpf   Epithelial Cells (non renal) 0-10 0 - 10 /hpf   Bacteria, UA Few None seen/Few  Bayer DCA Hb A1c Waived  Result Value Ref Range   Bayer DCA Hb A1c Waived 5.2 <7.0 %  CBC With Differential/Platelet  Result Value Ref Range   WBC 6.1 3.4 - 10.8 x10E3/uL   RBC 4.39 3.77 - 5.28 x10E6/uL   Hemoglobin 14.0 11.1 - 15.9 g/dL    Hematocrit 39.1 34.0 - 46.6 %   MCV 89 79 - 97 fL   MCH 31.9 26.6 - 33.0 pg   MCHC 35.8 (H) 31.5 - 35.7 g/dL   RDW 12.4 12.3 - 15.4 %   Platelets 277 150 - 379 x10E3/uL   Neutrophils 71 %   Lymphs 17 %   MID 12 %   Neutrophils Absolute 4.3 1.4 - 7.0 x10E3/uL   Lymphocytes Absolute 1.1 0.7 - 3.1 x10E3/uL   MID (Absolute) 0.7 0.1 - 1.6 X10E3/uL  Comprehensive metabolic panel  Result Value Ref Range   Glucose 87 65 - 99 mg/dL   BUN 16 6 - 24 mg/dL   Creatinine, Ser 0.81 0.57 - 1.00 mg/dL   GFR calc non Af Amer 89 >59 mL/min/1.73   GFR calc Af Amer 103 >59 mL/min/1.73   BUN/Creatinine Ratio 20 9 - 23   Sodium 139 134 - 144 mmol/L   Potassium 4.2 3.5 - 5.2 mmol/L   Chloride 100 96 - 106 mmol/L   CO2 23 18 - 29 mmol/L   Calcium 9.3 8.7 - 10.2 mg/dL   Total Protein 7.3 6.0 - 8.5 g/dL   Albumin 4.4 3.5 - 5.5 g/dL   Globulin, Total 2.9 1.5 - 4.5 g/dL   Albumin/Globulin Ratio 1.5 1.2 - 2.2   Bilirubin Total 1.1 0.0 - 1.2 mg/dL   Alkaline Phosphatase 53 39 - 117 IU/L   AST 13 0 - 40 IU/L   ALT 10 0 - 32 IU/L  TSH  Result Value Ref Range   TSH 3.140 0.450 - 4.500 uIU/mL  VITAMIN D 25 Hydroxy (Vit-D Deficiency, Fractures)  Result Value Ref Range   Vit D, 25-Hydroxy 21.4 (L) 30.0 - 100.0 ng/mL  Lipid Panel w/o Chol/HDL Ratio  Result Value Ref Range   Cholesterol, Total 125 100 - 199 mg/dL   Triglycerides 75 0 - 149 mg/dL   HDL 51 >39 mg/dL   VLDL Cholesterol Cal 15 5 - 40 mg/dL   LDL Calculated 59 0 - 99 mg/dL  UA/M w/rflx Culture, Routine  Result Value Ref Range   Specific Gravity, UA 1.015 1.005 - 1.030   pH, UA 5.5 5.0 - 7.5   Color, UA Yellow Yellow   Appearance Ur Clear Clear   Leukocytes, UA Negative Negative   Protein, UA Negative Negative/Trace   Glucose, UA Negative Negative   Ketones, UA Negative Negative   RBC, UA Trace (A) Negative   Bilirubin, UA Negative Negative   Urobilinogen, Ur 0.2 0.2 - 1.0 mg/dL   Nitrite, UA Negative Negative   Microscopic  Examination See below:       Assessment & Plan:   Problem List Items Addressed This Visit      Other   Anxiety disorder - Primary    Doing OK but acting up a little bit more. Will start her on remeron to see if that helps. Call with any concerns. Recheck in 1  month.        Other Visit Diagnoses   None.      Follow up plan: Return in about 4 weeks (around 09/22/2016) for Follow up mood.

## 2016-09-11 ENCOUNTER — Ambulatory Visit: Payer: BLUE CROSS/BLUE SHIELD | Admitting: Family Medicine

## 2016-09-27 ENCOUNTER — Ambulatory Visit (INDEPENDENT_AMBULATORY_CARE_PROVIDER_SITE_OTHER): Payer: BLUE CROSS/BLUE SHIELD | Admitting: Family Medicine

## 2016-09-27 ENCOUNTER — Encounter: Payer: Self-pay | Admitting: Family Medicine

## 2016-09-27 DIAGNOSIS — F410 Panic disorder [episodic paroxysmal anxiety] without agoraphobia: Secondary | ICD-10-CM | POA: Diagnosis not present

## 2016-09-27 MED ORDER — MIRTAZAPINE 15 MG PO TABS: 15.0000 mg | ORAL_TABLET | Freq: Every day | ORAL | 1 refills | Status: DC

## 2016-09-27 NOTE — Progress Notes (Signed)
BP 133/79 (BP Location: Left Arm, Patient Position: Sitting, Cuff Size: Normal)   Pulse 84   Temp 97.6 F (36.4 C)   Wt 160 lb 9.6 oz (72.8 kg)   SpO2 100%   BMI 30.85 kg/m    Subjective:    Patient ID: Sherry Gonzalez, female    DOB: 1970/11/22, 45 y.o.   MRN: [id number]  HPI: Sherry Gonzalez is a 45 y.o. female  Chief Complaint  Patient presents with  . Anxiety   ANXIETY/STRESS- feeling better on the medicine. No side effects.  Duration:controlled Anxious mood: yes  Excessive worrying: yes Irritability: no  Sweating: no Nausea: no Palpitations:no Hyperventilation: no Panic attacks: yes Agoraphobia: no  Obscessions/compulsions: no Depressed mood: no Depression screen [facility] 2/9 09/27/2016 05/29/2016 05/02/2016 04/10/2016  Decreased Interest 0 0 0 0  Down, Depressed, Hopeless 0 0 0 0  PHQ - 2 Score 0 0 0 0  Altered sleeping - 1 3 -  Tired, decreased energy - 0 3 -  Change in appetite - 0 2 -  Feeling bad or failure about yourself  - 0 0 -  Trouble concentrating - 0 0 -  Moving slowly or fidgety/restless - 0 0 -  Suicidal thoughts - 0 0 -  PHQ-9 Score - 1 8 -  Difficult doing work/chores - Somewhat difficult Somewhat difficult -   Anhedonia: no Weight changes: no Insomnia: no   Hypersomnia: no Fatigue/loss of energy: no Feelings of worthlessness: no Feelings of guilt: no Impaired concentration/indecisiveness: no Suicidal ideations: no  Crying spells: no Recent Stressors/Life Changes: yes  Relevant past medical, surgical, family and social history reviewed and updated as indicated. Interim medical history since our last visit reviewed. Allergies and medications reviewed and updated.  Review of Systems  Constitutional: Negative.   Respiratory: Negative.   Cardiovascular: Negative.   Psychiatric/Behavioral: Negative.     Per HPI unless specifically indicated above     Objective:    BP 133/79 (BP Location: Left Arm, Patient Position: Sitting, Cuff Size: Normal)    Pulse 84   Temp 97.6 F (36.4 C)   Wt 160 lb 9.6 oz (72.8 kg)   SpO2 100%   BMI 30.85 kg/m   Wt Readings from Last 3 Encounters:  09/27/16 160 lb 9.6 oz (72.8 kg)  08/25/16 153 lb 3.2 oz (69.5 kg)  07/07/16 155 lb (70.3 kg)    Physical Exam  Constitutional: She is oriented to person, place, and time. She appears well-developed and well-nourished. No distress.  HENT:  Head: Normocephalic and atraumatic.  Right Ear: Hearing normal.  Left Ear: Hearing normal.  Nose: Nose normal.  Eyes: Conjunctivae and lids are normal. Right eye exhibits no discharge. Left eye exhibits no discharge. No scleral icterus.  Cardiovascular: Normal rate, regular rhythm, normal heart sounds and intact distal pulses.  Exam reveals no gallop and no friction rub.   No murmur heard. Pulmonary/Chest: Effort normal and breath sounds normal. No respiratory distress. She has no wheezes. She has no rales. She exhibits no tenderness.  Musculoskeletal: Normal range of motion.  Neurological: She is alert and oriented to person, place, and time.  Skin: Skin is warm, dry and intact. No rash noted. No erythema. No pallor.  Psychiatric: She has a normal mood and affect. Her speech is normal and behavior is normal. Judgment and thought content normal. Cognition and memory are normal.  Nursing note and vitals reviewed.   Results for orders placed or performed in visit on 07/07/16  Microscopic  Examination  Result Value Ref Range   WBC, UA 0-5 0 - 5 /hpf   RBC, UA 0-2 0 - 2 /hpf   Epithelial Cells (non renal) 0-10 0 - 10 /hpf   Bacteria, UA Few None seen/Few  Bayer DCA Hb A1c Waived  Result Value Ref Range   Bayer DCA Hb A1c Waived 5.2 <7.0 %  CBC With Differential/Platelet  Result Value Ref Range   WBC 6.1 3.4 - 10.8 x10E3/uL   RBC 4.39 3.77 - 5.28 x10E6/uL   Hemoglobin 14.0 11.1 - 15.9 g/dL   Hematocrit 39.1 34.0 - 46.6 %   MCV 89 79 - 97 fL   MCH 31.9 26.6 - 33.0 pg   MCHC 35.8 (H) 31.5 - 35.7 g/dL   RDW 12.4  12.3 - 15.4 %   Platelets 277 150 - 379 x10E3/uL   Neutrophils 71 %   Lymphs 17 %   MID 12 %   Neutrophils Absolute 4.3 1.4 - 7.0 x10E3/uL   Lymphocytes Absolute 1.1 0.7 - 3.1 x10E3/uL   MID (Absolute) 0.7 0.1 - 1.6 X10E3/uL  Comprehensive metabolic panel  Result Value Ref Range   Glucose 87 65 - 99 mg/dL   BUN 16 6 - 24 mg/dL   Creatinine, Ser 0.81 0.57 - 1.00 mg/dL   GFR calc non Af Amer 89 >59 mL/min/1.73   GFR calc Af Amer 103 >59 mL/min/1.73   BUN/Creatinine Ratio 20 9 - 23   Sodium 139 134 - 144 mmol/L   Potassium 4.2 3.5 - 5.2 mmol/L   Chloride 100 96 - 106 mmol/L   CO2 23 18 - 29 mmol/L   Calcium 9.3 8.7 - 10.2 mg/dL   Total Protein 7.3 6.0 - 8.5 g/dL   Albumin 4.4 3.5 - 5.5 g/dL   Globulin, Total 2.9 1.5 - 4.5 g/dL   Albumin/Globulin Ratio 1.5 1.2 - 2.2   Bilirubin Total 1.1 0.0 - 1.2 mg/dL   Alkaline Phosphatase 53 39 - 117 IU/L   AST 13 0 - 40 IU/L   ALT 10 0 - 32 IU/L  TSH  Result Value Ref Range   TSH 3.140 0.450 - 4.500 uIU/mL  VITAMIN D 25 Hydroxy (Vit-D Deficiency, Fractures)  Result Value Ref Range   Vit D, 25-Hydroxy 21.4 (L) 30.0 - 100.0 ng/mL  Lipid Panel w/o Chol/HDL Ratio  Result Value Ref Range   Cholesterol, Total 125 100 - 199 mg/dL   Triglycerides 75 0 - 149 mg/dL   HDL 51 >39 mg/dL   VLDL Cholesterol Cal 15 5 - 40 mg/dL   LDL Calculated 59 0 - 99 mg/dL  UA/M w/rflx Culture, Routine  Result Value Ref Range   Specific Gravity, UA 1.015 1.005 - 1.030   pH, UA 5.5 5.0 - 7.5   Color, UA Yellow Yellow   Appearance Ur Clear Clear   Leukocytes, UA Negative Negative   Protein, UA Negative Negative/Trace   Glucose, UA Negative Negative   Ketones, UA Negative Negative   RBC, UA Trace (A) Negative   Bilirubin, UA Negative Negative   Urobilinogen, Ur 0.2 0.2 - 1.0 mg/dL   Nitrite, UA Negative Negative   Microscopic Examination See below:       Assessment & Plan:   Problem List Items Addressed This Visit      Other   Anxiety disorder     Doing much better! Feeling pretty good! Continue current medicine. OK to use xanax PRN. Call with any concerns. Recheck 6 months.  Follow up plan: Return in about 6 months (around 03/27/2017) for Physical .

## 2016-09-27 NOTE — Assessment & Plan Note (Signed)
Doing much better! Feeling pretty good! Continue current medicine. OK to use xanax PRN. Call with any concerns. Recheck 6 months.

## 2016-09-28 DIAGNOSIS — F411 Generalized anxiety disorder: Secondary | ICD-10-CM | POA: Diagnosis not present

## 2016-11-24 ENCOUNTER — Other Ambulatory Visit: Payer: Self-pay | Admitting: Family Medicine

## 2016-11-24 MED ORDER — MIRTAZAPINE 15 MG PO TABS: 15.0000 mg | ORAL_TABLET | Freq: Every day | ORAL | 1 refills | Status: DC

## 2016-11-24 NOTE — Telephone Encounter (Signed)
Pt called stated she needs an RX for a 90 day supply for her Mirtazapine. Pharm is [facility] in [place]. Pt stated this is required by her insurance company. Thanks.

## 2016-11-24 NOTE — Telephone Encounter (Signed)
Routing to provider. Next appt 03/28/17. 

## 2017-01-19 DIAGNOSIS — R000 Tachycardia, unspecified: Secondary | ICD-10-CM | POA: Diagnosis not present

## 2017-01-19 DIAGNOSIS — F410 Panic disorder [episodic paroxysmal anxiety] without agoraphobia: Secondary | ICD-10-CM | POA: Diagnosis not present

## 2017-02-20 ENCOUNTER — Telehealth: Payer: Self-pay | Admitting: Family Medicine

## 2017-02-20 NOTE — Telephone Encounter (Signed)
It is. She should make sure she's giving herself at least 8-10 hours of rest before she has to get up after she takes it.

## 2017-02-20 NOTE — Telephone Encounter (Signed)
Patient will try cutting pill in half.

## 2017-02-20 NOTE — Telephone Encounter (Signed)
Spoke with patient about medication side effects. She stated that she is taking it at 8 in the evening and getting to bed by 9:30. She gets up at 7 in the am. I spoke to her today at 11:30 and she said she is still sleepy and tired. Wondering if she still needs this med or should dosage be reduced, just not sure what to do.

## 2017-02-20 NOTE — Telephone Encounter (Signed)
OK to cut pill in 1/2. Thanks!

## 2017-02-20 NOTE — Telephone Encounter (Signed)
Pt called and stated that she's groggy in the morning after taking mirtazapine (REMERON) 15 MG tablet and would like to know if this is common.

## 2017-03-28 ENCOUNTER — Ambulatory Visit (INDEPENDENT_AMBULATORY_CARE_PROVIDER_SITE_OTHER): Payer: BLUE CROSS/BLUE SHIELD | Admitting: Family Medicine

## 2017-03-28 ENCOUNTER — Encounter: Payer: Self-pay | Admitting: Family Medicine

## 2017-03-28 VITALS — BP 118/80 | HR 83 | Ht 59.4 in | Wt 161.9 lb

## 2017-03-28 DIAGNOSIS — Z1231 Encounter for screening mammogram for malignant neoplasm of breast: Secondary | ICD-10-CM

## 2017-03-28 DIAGNOSIS — Z0000 Encounter for general adult medical examination without abnormal findings: Secondary | ICD-10-CM | POA: Diagnosis not present

## 2017-03-28 DIAGNOSIS — E559 Vitamin D deficiency, unspecified: Secondary | ICD-10-CM

## 2017-03-28 DIAGNOSIS — F410 Panic disorder [episodic paroxysmal anxiety] without agoraphobia: Secondary | ICD-10-CM

## 2017-03-28 DIAGNOSIS — Z1239 Encounter for other screening for malignant neoplasm of breast: Secondary | ICD-10-CM

## 2017-03-28 LAB — UA/M W/RFLX CULTURE, ROUTINE
BILIRUBIN UA: NEGATIVE
Glucose, UA: NEGATIVE
Ketones, UA: NEGATIVE
Leukocytes, UA: NEGATIVE
Nitrite, UA: NEGATIVE
PH UA: 5.5 (ref 5.0–7.5)
Protein, UA: NEGATIVE
RBC, UA: NEGATIVE
Urobilinogen, Ur: 0.2 mg/dL (ref 0.2–1.0)

## 2017-03-28 LAB — MICROSCOPIC EXAMINATION
BACTERIA UA: NONE SEEN
RBC, UA: NONE SEEN /hpf (ref 0–?)
WBC UA: NONE SEEN /HPF (ref 0–?)

## 2017-03-28 MED ORDER — ALPRAZOLAM 0.25 MG PO TABS: ORAL_TABLET | ORAL | 2 refills | Status: DC

## 2017-03-28 MED ORDER — MIRTAZAPINE 15 MG PO TABS: 15.0000 mg | ORAL_TABLET | Freq: Every day | ORAL | 1 refills | Status: DC

## 2017-03-28 MED ORDER — ALBUTEROL SULFATE HFA 108 (90 BASE) MCG/ACT IN AERS: 2.0000 | INHALATION_SPRAY | Freq: Four times a day (QID) | RESPIRATORY_TRACT | 2 refills | Status: DC | PRN

## 2017-03-28 NOTE — Patient Instructions (Addendum)
Health Maintenance, Female Adopting a healthy lifestyle and getting preventive care can go a long way to promote health and wellness. Talk with your health care provider about what schedule of regular examinations is right for you. This is a good chance for you to check in with your provider about disease prevention and staying healthy. In between checkups, there are plenty of things you can do on your own. Experts have done a lot of research about which lifestyle changes and preventive measures are most likely to keep you healthy. Ask your health care provider for more information. Weight and diet Eat a healthy diet  Be sure to include plenty of vegetables, fruits, low-fat dairy products, and lean protein.  Do not eat a lot of foods high in solid fats, added sugars, or salt.  Get regular exercise. This is one of the most important things you can do for your health.  Most adults should exercise for at least 150 minutes each week. The exercise should increase your heart rate and make you sweat (moderate-intensity exercise).  Most adults should also do strengthening exercises at least twice a week. This is in addition to the moderate-intensity exercise. Maintain a healthy weight  Body mass index (BMI) is a measurement that can be used to identify possible weight problems. It estimates body fat based on height and weight. Your health care provider can help determine your BMI and help you achieve or maintain a healthy weight.  For females 76 years of age and older:  A BMI below 18.5 is considered underweight.  A BMI of 18.5 to 24.9 is normal.  A BMI of 25 to 29.9 is considered overweight.  A BMI of 30 and above is considered obese. Watch levels of cholesterol and blood lipids  You should start having your blood tested for lipids and cholesterol at 44 years of age, then have this test every 5 years.  You may need to have your cholesterol levels checked more often if:  Your lipid or  cholesterol levels are high.  You are older than 44 years of age.  You are at high risk for heart disease. Cancer screening Lung Cancer  Lung cancer screening is recommended for adults 64-42 years old who are at high risk for lung cancer because of a history of smoking.  A yearly low-dose CT scan of the lungs is recommended for people who:  Currently smoke.  Have quit within the past 15 years.  Have at least a 30-pack-year history of smoking. A pack year is smoking an average of one pack of cigarettes a day for 1 year.  Yearly screening should continue until it has been 15 years since you quit.  Yearly screening should stop if you develop a health problem that would prevent you from having lung cancer treatment. Breast Cancer  Practice breast self-awareness. This means understanding how your breasts normally appear and feel.  It also means doing regular breast self-exams. Let your health care provider know about any changes, no matter how small.  If you are in your 20s or 30s, you should have a clinical breast exam (CBE) by a health care provider every 1-3 years as part of a regular health exam.  If you are 34 or older, have a CBE every year. Also consider having a breast X-ray (mammogram) every year.  If you have a family history of breast cancer, talk to your health care provider about genetic screening.  If you are at high risk for breast cancer, talk  to your health care provider about having an MRI and a mammogram every year.  Breast cancer gene (BRCA) assessment is recommended for women who have family members with BRCA-related cancers. BRCA-related cancers include:  Breast.  Ovarian.  Tubal.  Peritoneal cancers.  Results of the assessment will determine the need for genetic counseling and BRCA1 and BRCA2 testing. Cervical Cancer  Your health care provider may recommend that you be screened regularly for cancer of the pelvic organs (ovaries, uterus, and vagina).  This screening involves a pelvic examination, including checking for microscopic changes to the surface of your cervix (Pap test). You may be encouraged to have this screening done every 3 years, beginning at age 24.  For women ages 66-65, health care providers may recommend pelvic exams and Pap testing every 3 years, or they may recommend the Pap and pelvic exam, combined with testing for human papilloma virus (HPV), every 5 years. Some types of HPV increase your risk of cervical cancer. Testing for HPV may also be done on women of any age with unclear Pap test results.  Other health care providers may not recommend any screening for nonpregnant women who are considered low risk for pelvic cancer and who do not have symptoms. Ask your health care provider if a screening pelvic exam is right for you.  If you have had past treatment for cervical cancer or a condition that could lead to cancer, you need Pap tests and screening for cancer for at least 20 years after your treatment. If Pap tests have been discontinued, your risk factors (such as having a new sexual partner) need to be reassessed to determine if screening should resume. Some women have medical problems that increase the chance of getting cervical cancer. In these cases, your health care provider may recommend more frequent screening and Pap tests. Colorectal Cancer  This type of cancer can be detected and often prevented.  Routine colorectal cancer screening usually begins at 44 years of age and continues through 44 years of age.  Your health care provider may recommend screening at an earlier age if you have risk factors for colon cancer.  Your health care provider may also recommend using home test kits to check for hidden blood in the stool.  A small camera at the end of a tube can be used to examine your colon directly (sigmoidoscopy or colonoscopy). This is done to check for the earliest forms of colorectal cancer.  Routine  screening usually begins at age 41.  Direct examination of the colon should be repeated every 5-10 years through 44 years of age. However, you may need to be screened more often if early forms of precancerous polyps or small growths are found. Skin Cancer  Check your skin from head to toe regularly.  Tell your health care provider about any new moles or changes in moles, especially if there is a change in a mole's shape or color.  Also tell your health care provider if you have a mole that is larger than the size of a pencil eraser.  Always use sunscreen. Apply sunscreen liberally and repeatedly throughout the day.  Protect yourself by wearing long sleeves, pants, a wide-brimmed hat, and sunglasses whenever you are outside. Heart disease, diabetes, and high blood pressure  High blood pressure causes heart disease and increases the risk of stroke. High blood pressure is more likely to develop in:  People who have blood pressure in the high end of the normal range (130-139/85-89 mm Hg).  People who are overweight or obese.  People who are African American.  If you are 59-24 years of age, have your blood pressure checked every 3-5 years. If you are 34 years of age or older, have your blood pressure checked every year. You should have your blood pressure measured twice-once when you are at a hospital or clinic, and once when you are not at a hospital or clinic. Record the average of the two measurements. To check your blood pressure when you are not at a hospital or clinic, you can use:  An automated blood pressure machine at a pharmacy.  A home blood pressure monitor.  If you are between 29 years and 60 years old, ask your health care provider if you should take aspirin to prevent strokes.  Have regular diabetes screenings. This involves taking a blood sample to check your fasting blood sugar level.  If you are at a normal weight and have a low risk for diabetes, have this test once  every three years after 44 years of age.  If you are overweight and have a high risk for diabetes, consider being tested at a younger age or more often. Preventing infection Hepatitis B  If you have a higher risk for hepatitis B, you should be screened for this virus. You are considered at high risk for hepatitis B if:  You were born in a country where hepatitis B is common. Ask your health care provider which countries are considered high risk.  Your parents were born in a high-risk country, and you have not been immunized against hepatitis B (hepatitis B vaccine).  You have HIV or AIDS.  You use needles to inject street drugs.  You live with someone who has hepatitis B.  You have had sex with someone who has hepatitis B.  You get hemodialysis treatment.  You take certain medicines for conditions, including cancer, organ transplantation, and autoimmune conditions. Hepatitis C  Blood testing is recommended for:  Everyone born from [birth year] through 1963.  Anyone with known risk factors for hepatitis C. Sexually transmitted infections (STIs)  You should be screened for sexually transmitted infections (STIs) including gonorrhea and chlamydia if:  You are sexually active and are younger than 44 years of age.  You are older than 44 years of age and your health care provider tells you that you are at risk for this type of infection.  Your sexual activity has changed since you were last screened and you are at an increased risk for chlamydia or gonorrhea. Ask your health care provider if you are at risk.  If you do not have HIV, but are at risk, it may be recommended that you take a prescription medicine daily to prevent HIV infection. This is called pre-exposure prophylaxis (PrEP). You are considered at risk if:  You are sexually active and do not regularly use condoms or know the HIV status of your partner(s).  You take drugs by injection.  You are sexually active with a partner  who has HIV. Talk with your health care provider about whether you are at high risk of being infected with HIV. If you choose to begin PrEP, you should first be tested for HIV. You should then be tested every 3 months for as long as you are taking PrEP. Pregnancy  If you are premenopausal and you may become pregnant, ask your health care provider about preconception counseling.  If you may become pregnant, take 400 to 800 micrograms (mcg) of folic acid  every day.  If you want to prevent pregnancy, talk to your health care provider about birth control (contraception). Osteoporosis and menopause  Osteoporosis is a disease in which the bones lose minerals and strength with aging. This can result in serious bone fractures. Your risk for osteoporosis can be identified using a bone density scan.  If you are 4 years of age or older, or if you are at risk for osteoporosis and fractures, ask your health care provider if you should be screened.  Ask your health care provider whether you should take a calcium or vitamin D supplement to lower your risk for osteoporosis.  Menopause may have certain physical symptoms and risks.  Hormone replacement therapy may reduce some of these symptoms and risks. Talk to your health care provider about whether hormone replacement therapy is right for you. Follow these instructions at home:  Schedule regular health, dental, and eye exams.  Stay current with your immunizations.  Do not use any tobacco products including cigarettes, chewing tobacco, or electronic cigarettes.  If you are pregnant, do not drink alcohol.  If you are breastfeeding, limit how much and how often you drink alcohol.  Limit alcohol intake to no more than 1 drink per day for nonpregnant women. One drink equals 12 ounces of beer, 5 ounces of wine, or 1 ounces of hard liquor.  Do not use street drugs.  Do not share needles.  Ask your health care provider for help if you need support  or information about quitting drugs.  Tell your health care provider if you often feel depressed.  Tell your health care provider if you have ever been abused or do not feel safe at home. This information is not intended to replace advice given to you by your health care provider. Make sure you discuss any questions you have with your health care provider. Document Released: 05/22/2011 Document Revised: 04/13/2016 Document Reviewed: 08/10/2015 [employer]vier Interactive Patient Education  2017 [employer].

## 2017-03-28 NOTE — Assessment & Plan Note (Signed)
Under fair control. Continue current regimen. Recheck in 6 months. Refills given.

## 2017-03-28 NOTE — Progress Notes (Signed)
BP 118/80 (BP Location: Left Arm, Patient Position: Sitting, Cuff Size: Large)   Pulse 83   Ht 4' 11.4" (1.509 m)   Wt 161 lb 14.4 oz (73.4 kg)   LMP 03/20/2017 (Approximate)   SpO2 100%   BMI 32.26 kg/m    Subjective:    Patient ID: Sherry Gonzalez, female    DOB: June 14, 1971, 46 y.o.   MRN: [id number]  HPI: Sherry Gonzalez is a 46 y.o. female presenting on 03/28/2017 for comprehensive medical examination. Current medical complaints include:  ANXIETY/STRESS Duration:stable Anxious mood: yes  Excessive worrying: yes Irritability: no  Sweating: no Nausea: no Palpitations:no Hyperventilation: no Panic attacks: no Agoraphobia: no  Obscessions/compulsions: no Depressed mood: no Depression screen [facility] 2/9 03/28/2017 09/27/2016 05/29/2016 05/02/2016 04/10/2016  Decreased Interest 0 0 0 0 0  Down, Depressed, Hopeless 0 0 0 0 0  PHQ - 2 Score 0 0 0 0 0  Altered sleeping - - 1 3 -  Tired, decreased energy - - 0 3 -  Change in appetite - - 0 2 -  Feeling bad or failure about yourself  - - 0 0 -  Trouble concentrating - - 0 0 -  Moving slowly or fidgety/restless - - 0 0 -  Suicidal thoughts - - 0 0 -  PHQ-9 Score - - 1 8 -  Difficult doing work/chores - - Somewhat difficult Somewhat difficult -   Anhedonia: no Weight changes: no Insomnia: no   Hypersomnia: no Fatigue/loss of energy: yes Feelings of worthlessness: no Feelings of guilt: no Impaired concentration/indecisiveness: no Suicidal ideations: no  Crying spells: no Recent Stressors/Life Changes: no  Menopausal Symptoms: no  Depression Screen done today and results listed below:  Depression screen [facility] 2/9 03/28/2017 09/27/2016 05/29/2016 05/02/2016 04/10/2016  Decreased Interest 0 0 0 0 0  Down, Depressed, Hopeless 0 0 0 0 0  PHQ - 2 Score 0 0 0 0 0  Altered sleeping - - 1 3 -  Tired, decreased energy - - 0 3 -  Change in appetite - - 0 2 -  Feeling bad or failure about yourself  - - 0 0 -  Trouble concentrating - - 0 0 -  Moving  slowly or fidgety/restless - - 0 0 -  Suicidal thoughts - - 0 0 -  PHQ-9 Score - - 1 8 -  Difficult doing work/chores - - Somewhat difficult Somewhat difficult -    Past Medical History:  Past Medical History:  Diagnosis Date  . Allergy-induced asthma   . Carpal tunnel syndrome, left    left upper limbt  . Carpal tunnel syndrome, right    right upper limb  . Eustachian tube disorder    left ear  . Hand eczema   . Mild intermittent asthma   . Trigger ring finger of left hand     Surgical History:  Past Surgical History:  Procedure Laterality Date  . WISDOM TOOTH EXTRACTION      Medications:  Current Outpatient Prescriptions on File Prior to Visit  Medication Sig  . acetaminophen (TYLENOL) 325 MG tablet Take 325 mg by mouth as needed.  . Loratadine (CLARITIN PO) Take 10 mg by mouth daily.    No current facility-administered medications on file prior to visit.     Allergies:  No Known Allergies  Social History:  Social History   Social History  . Marital status: Single    Spouse name: N/A  . Number of children: N/A  . Years of  education: N/A   Occupational History  . Not on file.   Social History Main Topics  . Smoking status: Never Smoker  . Smokeless tobacco: Never Used  . Alcohol use No  . Drug use: No  . Sexual activity: No   Other Topics Concern  . Not on file   Social History Narrative  . No narrative on file   History  Smoking Status  . Never Smoker  Smokeless Tobacco  . Never Used   History  Alcohol Use No    Family History:  Family History  Problem Relation Age of Onset  . Alcohol abuse Father   . Ehlers-Danlos syndrome Mother     Mothers side of the family  . Migraines Sister     Past medical history, surgical history, medications, allergies, family history and social history reviewed with patient today and changes made to appropriate areas of the chart.   Review of Systems  Constitutional: Negative.   HENT: Positive for  hearing loss. Negative for congestion, ear discharge, ear pain, nosebleeds, sinus pain, sore throat and tinnitus.   Eyes: Negative.   Respiratory: Negative.  Negative for stridor.   Cardiovascular: Negative.   Gastrointestinal: Negative.   Genitourinary: Negative.   Musculoskeletal: Negative.   Skin: Positive for rash. Negative for itching.  Neurological: Positive for dizziness. Negative for tingling, tremors, sensory change, speech change, focal weakness, seizures, loss of consciousness and headaches.  Endo/Heme/Allergies: Positive for polydipsia. Negative for environmental allergies. Does not bruise/bleed easily.  Psychiatric/Behavioral: Negative for depression, hallucinations, memory loss, substance abuse and suicidal ideas. The patient is nervous/anxious. The patient does not have insomnia.     All other ROS negative except what is listed above and in the HPI.      Objective:    BP 118/80 (BP Location: Left Arm, Patient Position: Sitting, Cuff Size: Large)   Pulse 83   Ht 4' 11.4" (1.509 m)   Wt 161 lb 14.4 oz (73.4 kg)   LMP 03/20/2017 (Approximate)   SpO2 100%   BMI 32.26 kg/m   Wt Readings from Last 3 Encounters:  03/28/17 161 lb 14.4 oz (73.4 kg)  09/27/16 160 lb 9.6 oz (72.8 kg)  08/25/16 153 lb 3.2 oz (69.5 kg)    Physical Exam  Constitutional: She is oriented to person, place, and time. She appears well-developed and well-nourished. No distress.  HENT:  Head: Normocephalic and atraumatic.  Right Ear: Hearing, tympanic membrane, external ear and ear canal normal.  Left Ear: Hearing, tympanic membrane, external ear and ear canal normal.  Nose: Nose normal.  Mouth/Throat: Uvula is midline, oropharynx is clear and moist and mucous membranes are normal. No oropharyngeal exudate.  Eyes: Conjunctivae and lids are normal. Pupils are equal, round, and reactive to light. Right eye exhibits no discharge. Left eye exhibits no discharge. No scleral icterus.  Neck: Normal range  of motion. Neck supple. No JVD present. No tracheal deviation present. No thyromegaly present.  Cardiovascular: Normal rate, regular rhythm, normal heart sounds and intact distal pulses.  Exam reveals no gallop and no friction rub.   No murmur heard. Pulmonary/Chest: Effort normal and breath sounds normal. No stridor. No respiratory distress. She has no wheezes. She has no rales. She exhibits no tenderness. Right breast exhibits no inverted nipple, no mass, no nipple discharge, no skin change and no tenderness. Left breast exhibits no inverted nipple, no mass, no nipple discharge, no skin change and no tenderness. Breasts are symmetrical.  Abdominal: Soft. Bowel sounds are  normal. She exhibits no distension and no mass. There is no tenderness. There is no rebound and no guarding.  Genitourinary:  Genitourinary Comments: Pelvic exam deferred today with shared decision making.   Musculoskeletal: Normal range of motion. She exhibits no edema, tenderness or deformity.  Lymphadenopathy:    She has no cervical adenopathy.  Neurological: She is alert and oriented to person, place, and time. She has normal reflexes. She displays normal reflexes. No cranial nerve deficit. She exhibits normal muscle tone. Coordination normal.  Skin: Skin is warm, dry and intact. No rash noted. She is not diaphoretic. No erythema. No pallor.  Psychiatric: She has a normal mood and affect. Her speech is normal and behavior is normal. Judgment and thought content normal. Cognition and memory are normal.  Nursing note and vitals reviewed.   Results for orders placed or performed in visit on 07/07/16  Microscopic Examination  Result Value Ref Range   WBC, UA 0-5 0 - 5 /hpf   RBC, UA 0-2 0 - 2 /hpf   Epithelial Cells (non renal) 0-10 0 - 10 /hpf   Bacteria, UA Few None seen/Few  Bayer DCA Hb A1c Waived  Result Value Ref Range   Bayer DCA Hb A1c Waived 5.2 <7.0 %  CBC With Differential/Platelet  Result Value Ref Range    WBC 6.1 3.4 - 10.8 x10E3/uL   RBC 4.39 3.77 - 5.28 x10E6/uL   Hemoglobin 14.0 11.1 - 15.9 g/dL   Hematocrit 39.1 34.0 - 46.6 %   MCV 89 79 - 97 fL   MCH 31.9 26.6 - 33.0 pg   MCHC 35.8 (H) 31.5 - 35.7 g/dL   RDW 12.4 12.3 - 15.4 %   Platelets 277 150 - 379 x10E3/uL   Neutrophils 71 %   Lymphs 17 %   MID 12 %   Neutrophils Absolute 4.3 1.4 - 7.0 x10E3/uL   Lymphocytes Absolute 1.1 0.7 - 3.1 x10E3/uL   MID (Absolute) 0.7 0.1 - 1.6 X10E3/uL  Comprehensive metabolic panel  Result Value Ref Range   Glucose 87 65 - 99 mg/dL   BUN 16 6 - 24 mg/dL   Creatinine, Ser 0.81 0.57 - 1.00 mg/dL   GFR calc non Af Amer 89 >59 mL/min/1.73   GFR calc Af Amer 103 >59 mL/min/1.73   BUN/Creatinine Ratio 20 9 - 23   Sodium 139 134 - 144 mmol/L   Potassium 4.2 3.5 - 5.2 mmol/L   Chloride 100 96 - 106 mmol/L   CO2 23 18 - 29 mmol/L   Calcium 9.3 8.7 - 10.2 mg/dL   Total Protein 7.3 6.0 - 8.5 g/dL   Albumin 4.4 3.5 - 5.5 g/dL   Globulin, Total 2.9 1.5 - 4.5 g/dL   Albumin/Globulin Ratio 1.5 1.2 - 2.2   Bilirubin Total 1.1 0.0 - 1.2 mg/dL   Alkaline Phosphatase 53 39 - 117 IU/L   AST 13 0 - 40 IU/L   ALT 10 0 - 32 IU/L  TSH  Result Value Ref Range   TSH 3.140 0.450 - 4.500 uIU/mL  VITAMIN D 25 Hydroxy (Vit-D Deficiency, Fractures)  Result Value Ref Range   Vit D, 25-Hydroxy 21.4 (L) 30.0 - 100.0 ng/mL  Lipid Panel w/o Chol/HDL Ratio  Result Value Ref Range   Cholesterol, Total 125 100 - 199 mg/dL   Triglycerides 75 0 - 149 mg/dL   HDL 51 >39 mg/dL   VLDL Cholesterol Cal 15 5 - 40 mg/dL   LDL Calculated 59 0 -  99 mg/dL  UA/M w/rflx Culture, Routine  Result Value Ref Range   Specific Gravity, UA 1.015 1.005 - 1.030   pH, UA 5.5 5.0 - 7.5   Color, UA Yellow Yellow   Appearance Ur Clear Clear   Leukocytes, UA Negative Negative   Protein, UA Negative Negative/Trace   Glucose, UA Negative Negative   Ketones, UA Negative Negative   RBC, UA Trace (A) Negative   Bilirubin, UA Negative  Negative   Urobilinogen, Ur 0.2 0.2 - 1.0 mg/dL   Nitrite, UA Negative Negative   Microscopic Examination See below:       Assessment & Plan:   Problem List Items Addressed This Visit      Other   Anxiety disorder    Under fair control. Continue current regimen. Recheck in 6 months. Refills given.       Vitamin D deficiency   Relevant Orders   VITAMIN D 25 Hydroxy (Vit-D Deficiency, Fractures)    Other Visit Diagnoses    Routine general medical examination at a health care facility    -  Primary   Vaccines up to date. Screening labs checked today. Pap up to date. Mammogram ordered today. Continue diet and exercise. Call with any concerns.    Relevant Orders   CBC with Differential/Platelet   Comprehensive metabolic panel   Lipid Panel w/o Chol/HDL Ratio   TSH   UA/M w/rflx Culture, Routine   VITAMIN D 25 Hydroxy (Vit-D Deficiency, Fractures)   Screening for breast cancer       Mammogram ordered today   Relevant Orders   MM DIGITAL SCREENING BILATERAL       Follow up plan: Return in about 6 months (around 09/28/2017) for Follow up anxiety.   LABORATORY TESTING:  - Pap smear: up to date  IMMUNIZATIONS:   - Tdap: Tetanus vaccination status reviewed: last tetanus booster within 10 years. - Influenza: Postponed to flu season - Pneumovax: Not applicable  SCREENING: -Mammogram: Ordered today   PATIENT COUNSELING:   Advised to take 1 mg of folate supplement per day if capable of pregnancy.   Sexuality: Discussed sexually transmitted diseases, partner selection, use of condoms, avoidance of unintended pregnancy  and contraceptive alternatives.   Advised to avoid cigarette smoking.  I discussed with the patient that most people either abstain from alcohol or drink within safe limits (<=14/week and <=4 drinks/occasion for males, <=7/weeks and <= 3 drinks/occasion for females) and that the risk for alcohol disorders and other health effects rises proportionally with the  number of drinks per week and how often a drinker exceeds daily limits.  Discussed cessation/primary prevention of drug use and availability of treatment for abuse.   Diet: Encouraged to adjust caloric intake to maintain  or achieve ideal body weight, to reduce intake of dietary saturated fat and total fat, to limit sodium intake by avoiding high sodium foods and not adding table salt, and to maintain adequate dietary potassium and calcium preferably from fresh fruits, vegetables, and low-fat dairy products.    stressed the importance of regular exercise  Injury prevention: Discussed safety belts, safety helmets, smoke detector, smoking near bedding or upholstery.   Dental health: Discussed importance of regular tooth brushing, flossing, and dental visits.    NEXT PREVENTATIVE PHYSICAL DUE IN 1 YEAR. Return in about 6 months (around 09/28/2017) for Follow up anxiety.

## 2017-03-29 ENCOUNTER — Encounter: Payer: Self-pay | Admitting: Family Medicine

## 2017-03-29 LAB — CBC WITH DIFFERENTIAL/PLATELET
BASOS ABS: 0 10*3/uL (ref 0.0–0.2)
Basos: 0 %
EOS (ABSOLUTE): 0.3 10*3/uL (ref 0.0–0.4)
Eos: 5 %
Hematocrit: 39.2 % (ref 34.0–46.6)
Hemoglobin: 12.9 g/dL (ref 11.1–15.9)
IMMATURE GRANS (ABS): 0 10*3/uL (ref 0.0–0.1)
IMMATURE GRANULOCYTES: 0 %
LYMPHS: 22 %
Lymphocytes Absolute: 1.2 10*3/uL (ref 0.7–3.1)
MCH: 29.7 pg (ref 26.6–33.0)
MCHC: 32.9 g/dL (ref 31.5–35.7)
MCV: 90 fL (ref 79–97)
Monocytes Absolute: 0.4 10*3/uL (ref 0.1–0.9)
Monocytes: 8 %
Neutrophils Absolute: 3.4 10*3/uL (ref 1.4–7.0)
Neutrophils: 65 %
PLATELETS: 265 10*3/uL (ref 150–379)
RBC: 4.34 x10E6/uL (ref 3.77–5.28)
RDW: 13.5 % (ref 12.3–15.4)
WBC: 5.3 10*3/uL (ref 3.4–10.8)

## 2017-03-29 LAB — LIPID PANEL W/O CHOL/HDL RATIO
CHOLESTEROL TOTAL: 138 mg/dL (ref 100–199)
HDL: 50 mg/dL (ref >39)
LDL Calculated: 70 mg/dL (ref 0–99)
Triglycerides: 90 mg/dL (ref 0–149)
VLDL Cholesterol Cal: 18 mg/dL (ref 5–40)

## 2017-03-29 LAB — COMPREHENSIVE METABOLIC PANEL
A/G RATIO: 1.7 (ref 1.2–2.2)
ALBUMIN: 4.3 g/dL (ref 3.5–5.5)
ALK PHOS: 52 IU/L (ref 39–117)
ALT: 13 IU/L (ref 0–32)
AST: 19 IU/L (ref 0–40)
BILIRUBIN TOTAL: 0.6 mg/dL (ref 0.0–1.2)
BUN / CREAT RATIO: 22 (ref 9–23)
BUN: 15 mg/dL (ref 6–24)
CHLORIDE: 97 mmol/L (ref 96–106)
CO2: 26 mmol/L (ref 18–29)
Calcium: 9.2 mg/dL (ref 8.7–10.2)
Creatinine, Ser: 0.69 mg/dL (ref 0.57–1.00)
GFR calc non Af Amer: 107 mL/min/{1.73_m2} (ref >59)
GFR, EST AFRICAN AMERICAN: 123 mL/min/{1.73_m2} (ref >59)
GLUCOSE: 83 mg/dL (ref 65–99)
Globulin, Total: 2.6 g/dL (ref 1.5–4.5)
POTASSIUM: 3.9 mmol/L (ref 3.5–5.2)
Sodium: 136 mmol/L (ref 134–144)
TOTAL PROTEIN: 6.9 g/dL (ref 6.0–8.5)

## 2017-03-29 LAB — VITAMIN D 25 HYDROXY (VIT D DEFICIENCY, FRACTURES): VIT D 25 HYDROXY: 34.3 ng/mL (ref 30.0–100.0)

## 2017-03-29 LAB — TSH: TSH: 3.06 u[IU]/mL (ref 0.450–4.500)

## 2017-06-25 ENCOUNTER — Telehealth: Payer: Self-pay | Admitting: Family Medicine

## 2017-06-25 NOTE — Telephone Encounter (Signed)
Patient notified

## 2017-06-25 NOTE — Telephone Encounter (Signed)
Pt had health screening and her BP was 100/70 and she would like to know if this would have anything to do with the remeron.

## 2017-06-25 NOTE — Telephone Encounter (Signed)
Forwarding to provider.

## 2017-06-25 NOTE — Telephone Encounter (Signed)
That is a normal BP. Nothing to worry about.

## 2017-08-17 DIAGNOSIS — R000 Tachycardia, unspecified: Secondary | ICD-10-CM | POA: Diagnosis not present

## 2017-08-17 DIAGNOSIS — F410 Panic disorder [episodic paroxysmal anxiety] without agoraphobia: Secondary | ICD-10-CM | POA: Diagnosis not present

## 2017-09-14 ENCOUNTER — Ambulatory Visit (INDEPENDENT_AMBULATORY_CARE_PROVIDER_SITE_OTHER): Payer: BLUE CROSS/BLUE SHIELD | Admitting: Family Medicine

## 2017-09-14 ENCOUNTER — Encounter: Payer: Self-pay | Admitting: Family Medicine

## 2017-09-14 VITALS — BP 138/81 | HR 99 | Temp 98.4°F | Wt 150.5 lb

## 2017-09-14 DIAGNOSIS — F410 Panic disorder [episodic paroxysmal anxiety] without agoraphobia: Secondary | ICD-10-CM | POA: Diagnosis not present

## 2017-09-14 DIAGNOSIS — Z23 Encounter for immunization: Secondary | ICD-10-CM | POA: Diagnosis not present

## 2017-09-14 MED ORDER — TRIAMCINOLONE ACETONIDE 0.1 % EX CREA: 1.0000 "application " | TOPICAL_CREAM | Freq: Two times a day (BID) | CUTANEOUS | 0 refills | Status: DC

## 2017-09-14 MED ORDER — MIRTAZAPINE 15 MG PO TABS: 15.0000 mg | ORAL_TABLET | Freq: Every day | ORAL | 1 refills | Status: DC

## 2017-09-14 NOTE — Patient Instructions (Signed)

## 2017-09-14 NOTE — Progress Notes (Signed)
BP 138/81 (BP Location: Left Arm, Patient Position: Sitting, Cuff Size: Normal)   Pulse 99   Temp 98.4 F (36.9 C)   Wt 150 lb 8 oz (68.3 kg)   SpO2 100%   BMI 29.99 kg/m    Subjective:    Patient ID: Sherry Gonzalez, female    DOB: January 03, 1971, 46 y.o.   MRN: [id number]  HPI: Sherry Gonzalez is a 46 y.o. female  Chief Complaint  Patient presents with  . Anxiety   ANXIETY/STRESS- not using the xanax at all Duration:controlled Anxious mood: no  Excessive worrying: no Irritability: no  Sweating: no Nausea: no Palpitations:no Hyperventilation: no Panic attacks: no Agoraphobia: no  Obscessions/compulsions: no Depressed mood: no Depression screen [facility] 2/9 09/14/2017 03/28/2017 09/27/2016 05/29/2016 05/02/2016  Decreased Interest 0 0 0 0 0  Down, Depressed, Hopeless 0 0 0 0 0  PHQ - 2 Score 0 0 0 0 0  Altered sleeping - - - 1 3  Tired, decreased energy - - - 0 3  Change in appetite - - - 0 2  Feeling bad or failure about yourself  - - - 0 0  Trouble concentrating - - - 0 0  Moving slowly or fidgety/restless - - - 0 0  Suicidal thoughts - - - 0 0  PHQ-9 Score - - - 1 8  Difficult doing work/chores - - - Somewhat difficult Somewhat difficult   Anhedonia: no Weight changes: no Insomnia: no   Hypersomnia: no Fatigue/loss of energy: no Feelings of worthlessness: no Feelings of guilt: no Impaired concentration/indecisiveness: no Suicidal ideations: no  Crying spells: no Recent Stressors/Life Changes: no   Relationship problems: no   Family stress: no     Financial stress: no    Job stress: no    Recent death/loss: no   Relevant past medical, surgical, family and social history reviewed and updated as indicated. Interim medical history since our last visit reviewed. Allergies and medications reviewed and updated.  Review of Systems  Constitutional: Negative.   Respiratory: Negative.   Cardiovascular: Negative.   Psychiatric/Behavioral: Negative.     Per HPI unless  specifically indicated above     Objective:    BP 138/81 (BP Location: Left Arm, Patient Position: Sitting, Cuff Size: Normal)   Pulse 99   Temp 98.4 F (36.9 C)   Wt 150 lb 8 oz (68.3 kg)   SpO2 100%   BMI 29.99 kg/m   Wt Readings from Last 3 Encounters:  09/14/17 150 lb 8 oz (68.3 kg)  03/28/17 161 lb 14.4 oz (73.4 kg)  09/27/16 160 lb 9.6 oz (72.8 kg)    Physical Exam  Constitutional: She is oriented to person, place, and time. She appears well-developed and well-nourished. No distress.  HENT:  Head: Normocephalic and atraumatic.  Right Ear: Hearing normal.  Left Ear: Hearing normal.  Nose: Nose normal.  Eyes: Conjunctivae and lids are normal. Right eye exhibits no discharge. Left eye exhibits no discharge. No scleral icterus.  Cardiovascular: Normal rate, regular rhythm, normal heart sounds and intact distal pulses.  Exam reveals no gallop and no friction rub.   No murmur heard. Pulmonary/Chest: Effort normal and breath sounds normal. No respiratory distress. She has no wheezes. She has no rales. She exhibits no tenderness.  Musculoskeletal: Normal range of motion.  Neurological: She is alert and oriented to person, place, and time.  Skin: Skin is warm, dry and intact. No rash noted. She is not diaphoretic. No erythema. No pallor.  Psychiatric: She has a normal mood and affect. Her speech is normal and behavior is normal. Judgment and thought content normal. Cognition and memory are normal.  Nursing note and vitals reviewed.   Results for orders placed or performed in visit on 03/28/17  Microscopic Examination  Result Value Ref Range   WBC, UA None seen 0 - 5 /hpf   RBC, UA None seen 0 - 2 /hpf   Epithelial Cells (non renal) 0-10 0 - 10 /hpf   Bacteria, UA None seen None seen/Few  CBC with Differential/Platelet  Result Value Ref Range   WBC 5.3 3.4 - 10.8 x10E3/uL   RBC 4.34 3.77 - 5.28 x10E6/uL   Hemoglobin 12.9 11.1 - 15.9 g/dL   Hematocrit 39.2 34.0 - 46.6 %    MCV 90 79 - 97 fL   MCH 29.7 26.6 - 33.0 pg   MCHC 32.9 31.5 - 35.7 g/dL   RDW 13.5 12.3 - 15.4 %   Platelets 265 150 - 379 x10E3/uL   Neutrophils 65 Not Estab. %   Lymphs 22 Not Estab. %   Monocytes 8 Not Estab. %   Eos 5 Not Estab. %   Basos 0 Not Estab. %   Neutrophils Absolute 3.4 1.4 - 7.0 x10E3/uL   Lymphocytes Absolute 1.2 0.7 - 3.1 x10E3/uL   Monocytes Absolute 0.4 0.1 - 0.9 x10E3/uL   EOS (ABSOLUTE) 0.3 0.0 - 0.4 x10E3/uL   Basophils Absolute 0.0 0.0 - 0.2 x10E3/uL   Immature Granulocytes 0 Not Estab. %   Immature Grans (Abs) 0.0 0.0 - 0.1 x10E3/uL  Comprehensive metabolic panel  Result Value Ref Range   Glucose 83 65 - 99 mg/dL   BUN 15 6 - 24 mg/dL   Creatinine, Ser 0.69 0.57 - 1.00 mg/dL   GFR calc non Af Amer 107 >59 mL/min/1.73   GFR calc Af Amer 123 >59 mL/min/1.73   BUN/Creatinine Ratio 22 9 - 23   Sodium 136 134 - 144 mmol/L   Potassium 3.9 3.5 - 5.2 mmol/L   Chloride 97 96 - 106 mmol/L   CO2 26 18 - 29 mmol/L   Calcium 9.2 8.7 - 10.2 mg/dL   Total Protein 6.9 6.0 - 8.5 g/dL   Albumin 4.3 3.5 - 5.5 g/dL   Globulin, Total 2.6 1.5 - 4.5 g/dL   Albumin/Globulin Ratio 1.7 1.2 - 2.2   Bilirubin Total 0.6 0.0 - 1.2 mg/dL   Alkaline Phosphatase 52 39 - 117 IU/L   AST 19 0 - 40 IU/L   ALT 13 0 - 32 IU/L  Lipid Panel w/o Chol/HDL Ratio  Result Value Ref Range   Cholesterol, Total 138 100 - 199 mg/dL   Triglycerides 90 0 - 149 mg/dL   HDL 50 >39 mg/dL   VLDL Cholesterol Cal 18 5 - 40 mg/dL   LDL Calculated 70 0 - 99 mg/dL  TSH  Result Value Ref Range   TSH 3.060 0.450 - 4.500 uIU/mL  UA/M w/rflx Culture, Routine  Result Value Ref Range   Specific Gravity, UA <1.005 (L) 1.005 - 1.030   pH, UA 5.5 5.0 - 7.5   Color, UA Yellow Yellow   Appearance Ur Clear Clear   Leukocytes, UA Negative Negative   Protein, UA Negative Negative/Trace   Glucose, UA Negative Negative   Ketones, UA Negative Negative   RBC, UA Negative Negative   Bilirubin, UA Negative  Negative   Urobilinogen, Ur 0.2 0.2 - 1.0 mg/dL   Nitrite, UA Negative Negative  Microscopic Examination See below:   VITAMIN D 25 Hydroxy (Vit-D Deficiency, Fractures)  Result Value Ref Range   Vit D, 25-Hydroxy 34.3 30.0 - 100.0 ng/mL      Assessment & Plan:   Problem List Items Addressed This Visit      Other   Anxiety disorder - Primary    Under great control. Feeling well. Continue current regimen. Continue to monitor. Call with any concerns. Refills given.       Relevant Medications   mirtazapine (REMERON) 15 MG tablet    Other Visit Diagnoses    Immunization due       Flu shot given today.   Relevant Orders   Flu Vaccine QUAD 6+ mos PF IM (Fluarix Quad PF) (Completed)       Follow up plan: Return in about 6 months (around 03/15/2018) for Physical.

## 2017-09-14 NOTE — Assessment & Plan Note (Signed)
Under great control. Feeling well. Continue current regimen. Continue to monitor. Call with any concerns. Refills given.

## 2017-09-28 ENCOUNTER — Ambulatory Visit: Payer: BLUE CROSS/BLUE SHIELD | Admitting: Family Medicine

## 2018-03-15 ENCOUNTER — Ambulatory Visit: Payer: BLUE CROSS/BLUE SHIELD | Admitting: Family Medicine

## 2018-04-05 ENCOUNTER — Ambulatory Visit (INDEPENDENT_AMBULATORY_CARE_PROVIDER_SITE_OTHER): Payer: BLUE CROSS/BLUE SHIELD | Admitting: Family Medicine

## 2018-04-05 ENCOUNTER — Encounter: Payer: Self-pay | Admitting: Family Medicine

## 2018-04-05 VITALS — BP 138/85 | HR 75 | Temp 98.4°F | Ht 59.2 in | Wt 154.4 lb

## 2018-04-05 DIAGNOSIS — Z8269 Family history of other diseases of the musculoskeletal system and connective tissue: Secondary | ICD-10-CM | POA: Insufficient documentation

## 2018-04-05 DIAGNOSIS — F410 Panic disorder [episodic paroxysmal anxiety] without agoraphobia: Secondary | ICD-10-CM

## 2018-04-05 DIAGNOSIS — E559 Vitamin D deficiency, unspecified: Secondary | ICD-10-CM

## 2018-04-05 DIAGNOSIS — Z0000 Encounter for general adult medical examination without abnormal findings: Secondary | ICD-10-CM

## 2018-04-05 DIAGNOSIS — M21611 Bunion of right foot: Secondary | ICD-10-CM | POA: Diagnosis not present

## 2018-04-05 LAB — UA/M W/RFLX CULTURE, ROUTINE
Bilirubin, UA: NEGATIVE
GLUCOSE, UA: NEGATIVE
Ketones, UA: NEGATIVE
Leukocytes, UA: NEGATIVE
NITRITE UA: NEGATIVE
PH UA: 6 (ref 5.0–7.5)
PROTEIN UA: NEGATIVE
Specific Gravity, UA: <1.005 — ABNORMAL LOW (ref 1.005–1.030)
UUROB: 0.2 mg/dL (ref 0.2–1.0)

## 2018-04-05 LAB — MICROSCOPIC EXAMINATION: Bacteria, UA: NONE SEEN

## 2018-04-05 MED ORDER — ALBUTEROL SULFATE HFA 108 (90 BASE) MCG/ACT IN AERS: 2.0000 | INHALATION_SPRAY | Freq: Four times a day (QID) | RESPIRATORY_TRACT | 2 refills | Status: DC | PRN

## 2018-04-05 MED ORDER — MIRTAZAPINE 15 MG PO TABS: 15.0000 mg | ORAL_TABLET | Freq: Every day | ORAL | 1 refills | Status: DC

## 2018-04-05 MED ORDER — ALPRAZOLAM 0.25 MG PO TABS: ORAL_TABLET | ORAL | 0 refills | Status: DC

## 2018-04-05 NOTE — Assessment & Plan Note (Signed)
Not in pain. Will use orthotics, if getting worse, will refer to podiatry.

## 2018-04-05 NOTE — Progress Notes (Signed)
BP 138/85 (BP Location: Left Arm, Patient Position: Sitting, Cuff Size: Normal)   Pulse 75   Temp 98.4 F (36.9 C)   Ht 4' 11.2" (1.504 m)   Wt 154 lb 6 oz (70 kg)   SpO2 100%   BMI 30.97 kg/m    Subjective:    Patient ID: Sherry Gonzalez, female    DOB: [birth year], 47 y.o.   MRN: [id number]  HPI: Sherry Gonzalez is a 47 y.o. female presenting on 04/05/2018 for comprehensive medical examination. Current medical complaints include:  ANXIETY/STRESS- has a had a stressful day today Duration:controlled Anxious mood: yes  Excessive worrying: yes Irritability: no  Sweating: no Nausea: no Palpitations:no Hyperventilation: no Panic attacks: no Agoraphobia: no  Obscessions/compulsions: no Depressed mood: no Depression screen [facility] 2/9 04/05/2018 09/14/2017 03/28/2017 09/27/2016 05/29/2016  Decreased Interest 0 0 0 0 0  Down, Depressed, Hopeless 0 0 0 0 0  PHQ - 2 Score 0 0 0 0 0  Altered sleeping 0 - - - 1  Tired, decreased energy 0 - - - 0  Change in appetite 2 - - - 0  Feeling bad or failure about yourself  1 - - - 0  Trouble concentrating 0 - - - 0  Moving slowly or fidgety/restless 0 - - - 0  Suicidal thoughts 0 - - - 0  PHQ-9 Score 3 - - - 1  Difficult doing work/chores Not difficult at all - - - Somewhat difficult   Anhedonia: no Weight changes: no Insomnia: no   Hypersomnia: no Fatigue/loss of energy: no Feelings of worthlessness: no Feelings of guilt: no Impaired concentration/indecisiveness: no Suicidal ideations: no  Crying spells: no Recent Stressors/Life Changes: no   Relationship problems: no   Family stress: no     Financial stress: no    Job stress: no    Recent death/loss: no  Has a very strong family history of Ehlers-Danalos- not sure if she wants to be tested for it or not. She is considering it and will let us know  Menopausal Symptoms: no  Depression Screen done today and results listed below:  Depression screen [facility] 2/9 04/05/2018 09/14/2017 03/28/2017  09/27/2016 05/29/2016  Decreased Interest 0 0 0 0 0  Down, Depressed, Hopeless 0 0 0 0 0  PHQ - 2 Score 0 0 0 0 0  Altered sleeping 0 - - - 1  Tired, decreased energy 0 - - - 0  Change in appetite 2 - - - 0  Feeling bad or failure about yourself  1 - - - 0  Trouble concentrating 0 - - - 0  Moving slowly or fidgety/restless 0 - - - 0  Suicidal thoughts 0 - - - 0  PHQ-9 Score 3 - - - 1  Difficult doing work/chores Not difficult at all - - - Somewhat difficult    Past Medical History:  Past Medical History:  Diagnosis Date  . Allergy-induced asthma   . Carpal tunnel syndrome, left    left upper limbt  . Carpal tunnel syndrome, right    right upper limb  . Eustachian tube disorder    left ear  . Hand eczema   . Mild intermittent asthma   . Trigger ring finger of left hand     Surgical History:  Past Surgical History:  Procedure Laterality Date  . WISDOM TOOTH EXTRACTION      Medications:  Current Outpatient Medications on File Prior to Visit  Medication Sig  . acetaminophen (TYLENOL) 325  MG tablet Take 325 mg by mouth as needed.  . Loratadine (CLARITIN PO) Take 10 mg by mouth daily.   Marland Kitchen triamcinolone cream (KENALOG) 0.1 % Apply 1 application topically 2 (two) times daily.   No current facility-administered medications on file prior to visit.     Allergies:  No Known Allergies  Social History:  Social History   Socioeconomic History  . Marital status: Single    Spouse name: Not on file  . Number of children: Not on file  . Years of education: Not on file  . Highest education level: Not on file  Occupational History  . Not on file  Social Needs  . Financial resource strain: Not on file  . Food insecurity:    Worry: Not on file    Inability: Not on file  . Transportation needs:    Medical: Not on file    Non-medical: Not on file  Tobacco Use  . Smoking status: Never Smoker  . Smokeless tobacco: Never Used  Substance and Sexual Activity  . Alcohol use: No     Alcohol/week: 0.0 oz  . Drug use: No  . Sexual activity: Never  Lifestyle  . Physical activity:    Days per week: Not on file    Minutes per session: Not on file  . Stress: Not on file  Relationships  . Social connections:    Talks on phone: Not on file    Gets together: Not on file    Attends religious service: Not on file    Active member of club or organization: Not on file    Attends meetings of clubs or organizations: Not on file    Relationship status: Not on file  . Intimate partner violence:    Fear of current or ex partner: Not on file    Emotionally abused: Not on file    Physically abused: Not on file    Forced sexual activity: Not on file  Other Topics Concern  . Not on file  Social History Narrative  . Not on file   Social History   Tobacco Use  Smoking Status Never Smoker  Smokeless Tobacco Never Used   Social History   Substance and Sexual Activity  Alcohol Use No  . Alcohol/week: 0.0 oz    Family History:  Family History  Problem Relation Age of Onset  . Alcohol abuse Father   . Ehlers-Danlos syndrome Mother        Mothers side of the family  . Migraines Sister     Past medical history, surgical history, medications, allergies, family history and social history reviewed with patient today and changes made to appropriate areas of the chart.   Review of Systems  Constitutional: Positive for diaphoresis. Negative for chills, fever, malaise/fatigue and weight loss.  HENT: Negative.   Eyes: Negative.   Respiratory: Negative.   Cardiovascular: Negative.   Gastrointestinal: Positive for heartburn. Negative for abdominal pain, blood in stool, constipation, diarrhea, melena, nausea and vomiting.  Genitourinary: Negative.   Musculoskeletal: Negative.   Skin: Negative.   Neurological: Negative.   Endo/Heme/Allergies: Positive for polydipsia. Negative for environmental allergies. Does not bruise/bleed easily.  Psychiatric/Behavioral: Negative.      All other ROS negative except what is listed above and in the HPI.      Objective:    BP 138/85 (BP Location: Left Arm, Patient Position: Sitting, Cuff Size: Normal)   Pulse 75   Temp 98.4 F (36.9 C)   Ht 4' 11.2" (  1.504 m)   Wt 154 lb 6 oz (70 kg)   SpO2 100%   BMI 30.97 kg/m   Wt Readings from Last 3 Encounters:  04/05/18 154 lb 6 oz (70 kg)  09/14/17 150 lb 8 oz (68.3 kg)  03/28/17 161 lb 14.4 oz (73.4 kg)    Physical Exam  Constitutional: She is oriented to person, place, and time. She appears well-developed and well-nourished. No distress.  HENT:  Head: Normocephalic and atraumatic.  Right Ear: Hearing, tympanic membrane, external ear and ear canal normal.  Left Ear: Hearing, tympanic membrane, external ear and ear canal normal.  Nose: Nose normal.  Mouth/Throat: Uvula is midline, oropharynx is clear and moist and mucous membranes are normal. No oropharyngeal exudate.  Eyes: Pupils are equal, round, and reactive to light. Conjunctivae, EOM and lids are normal. Right eye exhibits no discharge. Left eye exhibits no discharge. No scleral icterus.  Neck: Normal range of motion. Neck supple. No JVD present. No tracheal deviation present. No thyromegaly present.  Cardiovascular: Normal rate, regular rhythm, normal heart sounds and intact distal pulses. Exam reveals no gallop and no friction rub.  No murmur heard. Pulmonary/Chest: Effort normal and breath sounds normal. No stridor. No respiratory distress. She has no wheezes. She has no rales. She exhibits no tenderness. Right breast exhibits no inverted nipple, no mass, no nipple discharge, no skin change and no tenderness. Left breast exhibits no inverted nipple, no mass, no nipple discharge, no skin change and no tenderness. No breast swelling, tenderness, discharge or bleeding. Breasts are symmetrical.  Abdominal: Soft. Bowel sounds are normal. She exhibits no distension and no mass. There is no tenderness. There is no  rebound and no guarding. No hernia.  Genitourinary:  Genitourinary Comments: Pelvic exam deferred with shared decision making  Musculoskeletal: Normal range of motion. She exhibits no edema, tenderness or deformity.  Lymphadenopathy:    She has no cervical adenopathy.  Neurological: She is alert and oriented to person, place, and time. She displays normal reflexes. No cranial nerve deficit or sensory deficit. She exhibits normal muscle tone. Coordination normal.  Skin: Skin is warm, dry and intact. Capillary refill takes less than 2 seconds. No rash noted. She is not diaphoretic. No erythema. No pallor.  Psychiatric: She has a normal mood and affect. Her speech is normal and behavior is normal. Judgment and thought content normal. Cognition and memory are normal.  Nursing note and vitals reviewed.   Results for orders placed or performed in visit on 03/28/17  Microscopic Examination  Result Value Ref Range   WBC, UA None seen 0 - 5 /hpf   RBC, UA None seen 0 - 2 /hpf   Epithelial Cells (non renal) 0-10 0 - 10 /hpf   Bacteria, UA None seen None seen/Few  CBC with Differential/Platelet  Result Value Ref Range   WBC 5.3 3.4 - 10.8 x10E3/uL   RBC 4.34 3.77 - 5.28 x10E6/uL   Hemoglobin 12.9 11.1 - 15.9 g/dL   Hematocrit 39.2 34.0 - 46.6 %   MCV 90 79 - 97 fL   MCH 29.7 26.6 - 33.0 pg   MCHC 32.9 31.5 - 35.7 g/dL   RDW 13.5 12.3 - 15.4 %   Platelets 265 150 - 379 x10E3/uL   Neutrophils 65 Not Estab. %   Lymphs 22 Not Estab. %   Monocytes 8 Not Estab. %   Eos 5 Not Estab. %   Basos 0 Not Estab. %   Neutrophils Absolute 3.4 1.4 -  7.0 x10E3/uL   Lymphocytes Absolute 1.2 0.7 - 3.1 x10E3/uL   Monocytes Absolute 0.4 0.1 - 0.9 x10E3/uL   EOS (ABSOLUTE) 0.3 0.0 - 0.4 x10E3/uL   Basophils Absolute 0.0 0.0 - 0.2 x10E3/uL   Immature Granulocytes 0 Not Estab. %   Immature Grans (Abs) 0.0 0.0 - 0.1 x10E3/uL  Comprehensive metabolic panel  Result Value Ref Range   Glucose 83 65 - 99 mg/dL    BUN 15 6 - 24 mg/dL   Creatinine, Ser 0.69 0.57 - 1.00 mg/dL   GFR calc non Af Amer 107 >59 mL/min/1.73   GFR calc Af Amer 123 >59 mL/min/1.73   BUN/Creatinine Ratio 22 9 - 23   Sodium 136 134 - 144 mmol/L   Potassium 3.9 3.5 - 5.2 mmol/L   Chloride 97 96 - 106 mmol/L   CO2 26 18 - 29 mmol/L   Calcium 9.2 8.7 - 10.2 mg/dL   Total Protein 6.9 6.0 - 8.5 g/dL   Albumin 4.3 3.5 - 5.5 g/dL   Globulin, Total 2.6 1.5 - 4.5 g/dL   Albumin/Globulin Ratio 1.7 1.2 - 2.2   Bilirubin Total 0.6 0.0 - 1.2 mg/dL   Alkaline Phosphatase 52 39 - 117 IU/L   AST 19 0 - 40 IU/L   ALT 13 0 - 32 IU/L  Lipid Panel w/o Chol/HDL Ratio  Result Value Ref Range   Cholesterol, Total 138 100 - 199 mg/dL   Triglycerides 90 0 - 149 mg/dL   HDL 50 >39 mg/dL   VLDL Cholesterol Cal 18 5 - 40 mg/dL   LDL Calculated 70 0 - 99 mg/dL  TSH  Result Value Ref Range   TSH 3.060 0.450 - 4.500 uIU/mL  UA/M w/rflx Culture, Routine  Result Value Ref Range   Specific Gravity, UA <1.005 (L) 1.005 - 1.030   pH, UA 5.5 5.0 - 7.5   Color, UA Yellow Yellow   Appearance Ur Clear Clear   Leukocytes, UA Negative Negative   Protein, UA Negative Negative/Trace   Glucose, UA Negative Negative   Ketones, UA Negative Negative   RBC, UA Negative Negative   Bilirubin, UA Negative Negative   Urobilinogen, Ur 0.2 0.2 - 1.0 mg/dL   Nitrite, UA Negative Negative   Microscopic Examination See below:   VITAMIN D 25 Hydroxy (Vit-D Deficiency, Fractures)  Result Value Ref Range   Vit D, 25-Hydroxy 34.3 30.0 - 100.0 ng/mL      Assessment & Plan:   Problem List Items Addressed This Visit      Musculoskeletal and Integument   Bunion of great toe of right foot    Not in pain. Will use orthotics, if getting worse, will refer to podiatry.        Other   Anxiety disorder    Under good control. Continue current regimen. Continue to monitor. Call with any concerns. Refills given.       Relevant Medications   ALPRAZolam (XANAX) 0.25  MG tablet   mirtazapine (REMERON) 15 MG tablet   Other Relevant Orders   CBC with Differential/Platelet   Comprehensive metabolic panel   TSH   Vitamin D deficiency    Rechecking levels today. Await results. Call with any concerns.      Relevant Orders   VITAMIN D 25 Hydroxy (Vit-D Deficiency, Fractures)   Family history of musculoskeletal disease    Family history of Ehler's Danalos- will consider if she wants to go get testing. Will call to let us know if she  does.        Other Visit Diagnoses    Routine general medical examination at a health care facility    -  Primary   Vaccines up to date. Screening labs checked today. Pap up to date. Mammogram order in. Call with any concerns. Continue to montior. Continue diet and exercise.    Relevant Orders   CBC with Differential/Platelet   Comprehensive metabolic panel   Lipid Panel w/o Chol/HDL Ratio   TSH   UA/M w/rflx Culture, Routine       Follow up plan: Return in about 6 months (around 10/06/2018) for follow up mood.   LABORATORY TESTING:  - Pap smear: up to date  IMMUNIZATIONS:   - Tdap: Tetanus vaccination status reviewed: last tetanus booster within 10 years. - Influenza: Postponed to flu season - Pneumovax: Not applicable - Prevnar: Not applicable - HPV: Refused - Zostavax vaccine: Not applicable  SCREENING: -Mammogram: Order in- encouraged her to get it done   PATIENT COUNSELING:   Advised to take 1 mg of folate supplement per day if capable of pregnancy.   Sexuality: Discussed sexually transmitted diseases, partner selection, use of condoms, avoidance of unintended pregnancy  and contraceptive alternatives.   Advised to avoid cigarette smoking.  I discussed with the patient that most people either abstain from alcohol or drink within safe limits (<=14/week and <=4 drinks/occasion for males, <=7/weeks and <= 3 drinks/occasion for females) and that the risk for alcohol disorders and other health effects  rises proportionally with the number of drinks per week and how often a drinker exceeds daily limits.  Discussed cessation/primary prevention of drug use and availability of treatment for abuse.   Diet: Encouraged to adjust caloric intake to maintain  or achieve ideal body weight, to reduce intake of dietary saturated fat and total fat, to limit sodium intake by avoiding high sodium foods and not adding table salt, and to maintain adequate dietary potassium and calcium preferably from fresh fruits, vegetables, and low-fat dairy products.    stressed the importance of regular exercise  Injury prevention: Discussed safety belts, safety helmets, smoke detector, smoking near bedding or upholstery.   Dental health: Discussed importance of regular tooth brushing, flossing, and dental visits.    NEXT PREVENTATIVE PHYSICAL DUE IN 1 YEAR. Return in about 6 months (around 10/06/2018) for follow up mood.

## 2018-04-05 NOTE — Assessment & Plan Note (Signed)
Rechecking levels today. Await results. Call with any concerns.  

## 2018-04-05 NOTE — Assessment & Plan Note (Signed)
Family history of Ehler's Danalos- will consider if she wants to go get testing. Will call to let us know if she does.

## 2018-04-05 NOTE — Assessment & Plan Note (Signed)
Under good control. Continue current regimen. Continue to monitor. Call with any concerns. Refills given.  

## 2018-04-05 NOTE — Patient Instructions (Addendum)
[facility]  Address: [address], [place], [place] [address]  Phone: [phone]  Health Maintenance, Female Adopting a healthy lifestyle and getting preventive care can go a long way to promote health and wellness. Talk with your health care provider about what schedule of regular examinations is right for you. This is a good chance for you to check in with your provider about disease prevention and staying healthy. In between checkups, there are plenty of things you can do on your own. Experts have done a lot of research about which lifestyle changes and preventive measures are most likely to keep you healthy. Ask your health care provider for more information. Weight and diet Eat a healthy diet  Be sure to include plenty of vegetables, fruits, low-fat dairy products, and lean protein.  Do not eat a lot of foods high in solid fats, added sugars, or salt.  Get regular exercise. This is one of the most important things you can do for your health. ? Most adults should exercise for at least 150 minutes each week. The exercise should increase your heart rate and make you sweat (moderate-intensity exercise). ? Most adults should also do strengthening exercises at least twice a week. This is in addition to the moderate-intensity exercise.  Maintain a healthy weight  Body mass index (BMI) is a measurement that can be used to identify possible weight problems. It estimates body fat based on height and weight. Your health care provider can help determine your BMI and help you achieve or maintain a healthy weight.  For females 20 years of age and older: ? A BMI below 18.5 is considered underweight. ? A BMI of 18.5 to 24.9 is normal. ? A BMI of 25 to 29.9 is considered overweight. ? A BMI of 30 and above is considered obese.  Watch levels of cholesterol and blood lipids  You should start having your blood tested for lipids and cholesterol at 45 years of  age, then have this test every 5 years.  You may need to have your cholesterol levels checked more often if: ? Your lipid or cholesterol levels are high. ? You are older than 45 years of age. ? You are at high risk for heart disease.  Cancer screening Lung Cancer  Lung cancer screening is recommended for adults 55-80 years old who are at high risk for lung cancer because of a history of smoking.  A yearly low-dose CT scan of the lungs is recommended for people who: ? Currently smoke. ? Have quit within the past 15 years. ? Have at least a 30-pack-year history of smoking. A pack year is smoking an average of one pack of cigarettes a day for 1 year.  Yearly screening should continue until it has been 15 years since you quit.  Yearly screening should stop if you develop a health problem that would prevent you from having lung cancer treatment.  Breast Cancer  Practice breast self-awareness. This means understanding how your breasts normally appear and feel.  It also means doing regular breast self-exams. Let your health care provider know about any changes, no matter how small.  If you are in your 20s or 30s, you should have a clinical breast exam (CBE) by a health care provider every 1-3 years as part of a regular health exam.  If you are 40 or older, have a CBE every year. Also consider having a breast X-ray (mammogram) every year.  If you have a   family history of breast cancer, talk to your health care provider about genetic screening.  If you are at high risk for breast cancer, talk to your health care provider about having an MRI and a mammogram every year.  Breast cancer gene (BRCA) assessment is recommended for women who have family members with BRCA-related cancers. BRCA-related cancers include: ? Breast. ? Ovarian. ? Tubal. ? Peritoneal cancers.  Results of the assessment will determine the need for genetic counseling and BRCA1 and BRCA2 testing.  Cervical  Cancer Your health care provider may recommend that you be screened regularly for cancer of the pelvic organs (ovaries, uterus, and vagina). This screening involves a pelvic examination, including checking for microscopic changes to the surface of your cervix (Pap test). You may be encouraged to have this screening done every 3 years, beginning at age 21.  For women ages 30-65, health care providers may recommend pelvic exams and Pap testing every 3 years, or they may recommend the Pap and pelvic exam, combined with testing for human papilloma virus (HPV), every 5 years. Some types of HPV increase your risk of cervical cancer. Testing for HPV may also be done on women of any age with unclear Pap test results.  Other health care providers may not recommend any screening for nonpregnant women who are considered low risk for pelvic cancer and who do not have symptoms. Ask your health care provider if a screening pelvic exam is right for you.  If you have had past treatment for cervical cancer or a condition that could lead to cancer, you need Pap tests and screening for cancer for at least 20 years after your treatment. If Pap tests have been discontinued, your risk factors (such as having a new sexual partner) need to be reassessed to determine if screening should resume. Some women have medical problems that increase the chance of getting cervical cancer. In these cases, your health care provider may recommend more frequent screening and Pap tests.  Colorectal Cancer  This type of cancer can be detected and often prevented.  Routine colorectal cancer screening usually begins at 45 years of age and continues through 45 years of age.  Your health care provider may recommend screening at an earlier age if you have risk factors for colon cancer.  Your health care provider may also recommend using home test kits to check for hidden blood in the stool.  A small camera at the end of a tube can be used to  examine your colon directly (sigmoidoscopy or colonoscopy). This is done to check for the earliest forms of colorectal cancer.  Routine screening usually begins at age 50.  Direct examination of the colon should be repeated every 5-10 years through 45 years of age. However, you may need to be screened more often if early forms of precancerous polyps or small growths are found.  Skin Cancer  Check your skin from head to toe regularly.  Tell your health care provider about any new moles or changes in moles, especially if there is a change in a mole's shape or color.  Also tell your health care provider if you have a mole that is larger than the size of a pencil eraser.  Always use sunscreen. Apply sunscreen liberally and repeatedly throughout the day.  Protect yourself by wearing long sleeves, pants, a wide-brimmed hat, and sunglasses whenever you are outside.  Heart disease, diabetes, and high blood pressure  High blood pressure causes heart disease and increases the risk of   stroke. High blood pressure is more likely to develop in: ? People who have blood pressure in the high end of the normal range (130-139/85-89 mm Hg). ? People who are overweight or obese. ? People who are African American.  If you are 18-39 years of age, have your blood pressure checked every 3-5 years. If you are 40 years of age or older, have your blood pressure checked every year. You should have your blood pressure measured twice-once when you are at a hospital or clinic, and once when you are not at a hospital or clinic. Record the average of the two measurements. To check your blood pressure when you are not at a hospital or clinic, you can use: ? An automated blood pressure machine at a pharmacy. ? A home blood pressure monitor.  If you are between 55 years and 79 years old, ask your health care provider if you should take aspirin to prevent strokes.  Have regular diabetes screenings. This involves taking a  blood sample to check your fasting blood sugar level. ? If you are at a normal weight and have a low risk for diabetes, have this test once every three years after 45 years of age. ? If you are overweight and have a high risk for diabetes, consider being tested at a younger age or more often. Preventing infection Hepatitis B  If you have a higher risk for hepatitis B, you should be screened for this virus. You are considered at high risk for hepatitis B if: ? You were born in a country where hepatitis B is common. Ask your health care provider which countries are considered high risk. ? Your parents were born in a high-risk country, and you have not been immunized against hepatitis B (hepatitis B vaccine). ? You have HIV or AIDS. ? You use needles to inject street drugs. ? You live with someone who has hepatitis B. ? You have had sex with someone who has hepatitis B. ? You get hemodialysis treatment. ? You take certain medicines for conditions, including cancer, organ transplantation, and autoimmune conditions.  Hepatitis C  Blood testing is recommended for: ? Everyone born from 1945 through 1965. ? Anyone with known risk factors for hepatitis C.  Sexually transmitted infections (STIs)  You should be screened for sexually transmitted infections (STIs) including gonorrhea and chlamydia if: ? You are sexually active and are younger than 45 years of age. ? You are older than 45 years of age and your health care provider tells you that you are at risk for this type of infection. ? Your sexual activity has changed since you were last screened and you are at an increased risk for chlamydia or gonorrhea. Ask your health care provider if you are at risk.  If you do not have HIV, but are at risk, it may be recommended that you take a prescription medicine daily to prevent HIV infection. This is called pre-exposure prophylaxis (PrEP). You are considered at risk if: ? You are sexually active and  do not regularly use condoms or know the HIV status of your partner(s). ? You take drugs by injection. ? You are sexually active with a partner who has HIV.  Talk with your health care provider about whether you are at high risk of being infected with HIV. If you choose to begin PrEP, you should first be tested for HIV. You should then be tested every 3 months for as long as you are taking PrEP. Pregnancy  If   If you are premenopausal and you may become pregnant, ask your health care provider about preconception counseling.  If you may become pregnant, take 400 to 800 micrograms (mcg) of folic acid every day.  If you want to prevent pregnancy, talk to your health care provider about birth control (contraception). Osteoporosis and menopause  Osteoporosis is a disease in which the bones lose minerals and strength with aging. This can result in serious bone fractures. Your risk for osteoporosis can be identified using a bone density scan.  If you are 33 years of age or older, or if you are at risk for osteoporosis and fractures, ask your health care provider if you should be screened.  Ask your health care provider whether you should take a calcium or vitamin D supplement to lower your risk for osteoporosis.  Menopause may have certain physical symptoms and risks.  Hormone replacement therapy may reduce some of these symptoms and risks. Talk to your health care provider about whether hormone replacement therapy is right for you. Follow these instructions at home:  Schedule regular health, dental, and eye exams.  Stay current with your immunizations.  Do not use any tobacco products including cigarettes, chewing tobacco, or electronic cigarettes.  If you are pregnant, do not drink alcohol.  If you are breastfeeding, limit how much and how often you drink alcohol.  Limit alcohol intake to no more than 1 drink per day for nonpregnant women. One drink equals 12 ounces of beer, 5 ounces of  wine, or 1 ounces of hard liquor.  Do not use street drugs.  Do not share needles.  Ask your health care provider for help if you need support or information about quitting drugs.  Tell your health care provider if you often feel depressed.  Tell your health care provider if you have ever been abused or do not feel safe at home. This information is not intended to replace advice given to you by your health care provider. Make sure you discuss any questions you have with your health care provider. Document Released: 05/22/2011 Document Revised: 04/13/2016 Document Reviewed: 08/10/2015 Elsevier Interactive Patient Education  2018 [employer] A bunion is a bump on the base of the big toe that forms when the bones of the big toe joint move out of position. Bunions may be small at first, but they often get larger over time. The can make walking painful. What are the causes? A bunion may be caused by:  Wearing narrow or pointed shoes that force the big toe to press against the other toes.  Abnormal foot development that causes the foot to roll inward (pronate).  Changes in the foot that are caused by certain diseases, such as rheumatoid arthritis and polio.  A foot injury.  What increases the risk? The following factors may make you more likely to develop this condition:  Wearing shoes that squeeze the toes together.  Having certain diseases, such as: ? Rheumatoid arthritis. ? Polio. ? Cerebral palsy.  Having family members who have bunions.  Being born with a foot deformity, such as flat feet or low arches.  Doing activities that put a lot of pressure on the feet, such as ballet dancing.  What are the signs or symptoms? The main symptom of a bunion is a noticeable bump on the big toe. Other symptoms may include:  Pain.  Swelling around the big toe.  Redness and inflammation.  Thick or hardened skin on the big toe or between the  toes.  Stiffness or loss of  motion in the big toe.  Trouble with walking.  How is this diagnosed? A bunion may be diagnosed based on your symptoms, medical history, and activities. You may have tests, such as:  X-rays. These allow your health care provider to check the position of the bones in your foot and look for damage to your joint. They also help your health care provider to determine the severity of your bunion and the best way to treat it.  Joint aspiration. In this test, a sample of fluid is removed from the toe joint. This test, which may be done if you are in a lot of pain, helps to rule out diseases that cause painful swelling of the joints, such as arthritis.  How is this treated? There is no cure for a bunion, but treatment can help to prevent a bunion from getting worse. Treatment depends on the severity of your symptoms. Your health care provider may recommend:  Wearing shoes that have a wide toe box.  Using bunion pads to cushion the affected area.  Taping your toes together to keep them in a normal position.  Placing a device inside your shoe (orthotics) to help reduce pressure on your toe joint.  Taking medicine to ease pain, inflammation, and swelling.  Applying heat or ice to the affected area.  Doing stretching exercises.  Surgery to remove scar tissue and move the toes back into their normal position. This treatment is rare.  Follow these instructions at home:  Support your toe joint with proper footwear, shoe padding, or taping as told by your health care provider.  Take over-the-counter and prescription medicines only as told by your health care provider.  If directed, apply ice to the injured area: ? Put ice in a plastic bag. ? Place a towel between your skin and the bag. ? Leave the ice on for 20 minutes, 2-3 times per day.  If directed, apply heat to the affected area before you exercise. Use the heat source that your health care provider recommends, such as a moist heat pack  or a heating pad. ? Place a towel between your skin and the heat source. ? Leave the heat on for 20-30 minutes. ? Remove the heat if your skin turns bright red. This is especially important if you are unable to feel pain, heat, or cold. You may have a greater risk of getting burned.  Do exercises as told by your health care provider.  Keep all follow-up visits as told by your health care provider. Contact a health care provider if:  Your symptoms get worse.  Your symptoms do not improve in 2 weeks. Get help right away if:  You have severe pain and trouble with walking. This information is not intended to replace advice given to you by your health care provider. Make sure you discuss any questions you have with your health care provider. Document Released: 11/06/2005 Document Revised: 04/13/2016 Document Reviewed: 06/06/2015 Elsevier Interactive Patient Education  [employer].

## 2018-04-06 LAB — LIPID PANEL W/O CHOL/HDL RATIO
Cholesterol, Total: 156 mg/dL (ref 100–199)
HDL: 62 mg/dL (ref >39)
LDL CALC: 80 mg/dL (ref 0–99)
Triglycerides: 72 mg/dL (ref 0–149)
VLDL Cholesterol Cal: 14 mg/dL (ref 5–40)

## 2018-04-06 LAB — COMPREHENSIVE METABOLIC PANEL
ALBUMIN: 4.5 g/dL (ref 3.5–5.5)
ALT: 12 IU/L (ref 0–32)
AST: 19 IU/L (ref 0–40)
Albumin/Globulin Ratio: 1.5 (ref 1.2–2.2)
Alkaline Phosphatase: 53 IU/L (ref 39–117)
BUN / CREAT RATIO: 20 (ref 9–23)
BUN: 15 mg/dL (ref 6–24)
Bilirubin Total: 1.1 mg/dL (ref 0.0–1.2)
CALCIUM: 9.2 mg/dL (ref 8.7–10.2)
CO2: 23 mmol/L (ref 20–29)
CREATININE: 0.76 mg/dL (ref 0.57–1.00)
Chloride: 101 mmol/L (ref 96–106)
GFR, EST AFRICAN AMERICAN: 110 mL/min/{1.73_m2} (ref >59)
GFR, EST NON AFRICAN AMERICAN: 95 mL/min/{1.73_m2} (ref >59)
GLUCOSE: 85 mg/dL (ref 65–99)
Globulin, Total: 3 g/dL (ref 1.5–4.5)
Potassium: 3.9 mmol/L (ref 3.5–5.2)
Sodium: 139 mmol/L (ref 134–144)
TOTAL PROTEIN: 7.5 g/dL (ref 6.0–8.5)

## 2018-04-06 LAB — CBC WITH DIFFERENTIAL/PLATELET
BASOS ABS: 0 10*3/uL (ref 0.0–0.2)
Basos: 1 %
EOS (ABSOLUTE): 0.3 10*3/uL (ref 0.0–0.4)
Eos: 5 %
HEMOGLOBIN: 13.3 g/dL (ref 11.1–15.9)
Hematocrit: 39.4 % (ref 34.0–46.6)
IMMATURE GRANS (ABS): 0 10*3/uL (ref 0.0–0.1)
Immature Granulocytes: 0 %
LYMPHS: 18 %
Lymphocytes Absolute: 1 10*3/uL (ref 0.7–3.1)
MCH: 30.9 pg (ref 26.6–33.0)
MCHC: 33.8 g/dL (ref 31.5–35.7)
MCV: 91 fL (ref 79–97)
MONOCYTES: 10 %
Monocytes Absolute: 0.6 10*3/uL (ref 0.1–0.9)
NEUTROS ABS: 3.9 10*3/uL (ref 1.4–7.0)
Neutrophils: 66 %
Platelets: 275 10*3/uL (ref 150–379)
RBC: 4.31 x10E6/uL (ref 3.77–5.28)
RDW: 12.8 % (ref 12.3–15.4)
WBC: 5.8 10*3/uL (ref 3.4–10.8)

## 2018-04-06 LAB — TSH: TSH: 5.97 u[IU]/mL — ABNORMAL HIGH (ref 0.450–4.500)

## 2018-04-06 LAB — VITAMIN D 25 HYDROXY (VIT D DEFICIENCY, FRACTURES): VIT D 25 HYDROXY: 37 ng/mL (ref 30.0–100.0)

## 2018-04-08 ENCOUNTER — Telehealth: Payer: Self-pay | Admitting: Family Medicine

## 2018-04-08 DIAGNOSIS — R7989 Other specified abnormal findings of blood chemistry: Secondary | ICD-10-CM

## 2018-04-08 NOTE — Telephone Encounter (Signed)
Routing to [facility] admin

## 2018-04-08 NOTE — Telephone Encounter (Signed)
Please let her know that her labs came back normal, but her thyroid was very slightly underactive. I just want to repeat it with just a lab visit in 4 weeks. Order in.

## 2018-04-08 NOTE — Telephone Encounter (Signed)
Pt given results per notes of Dr. Wynetta Emery on 04/08/18, patient verbalized understanding. Unable to document in result note due to result note not being routed to [facility].

## 2018-04-08 NOTE — Telephone Encounter (Signed)
Patient called, left VM to return call to the office for lab results. 

## 2018-04-08 NOTE — Telephone Encounter (Signed)
Called patient no answer, left a  Message for patient to return my call. OK for [facility] Nurse triage to give results.

## 2018-04-17 ENCOUNTER — Ambulatory Visit: Payer: Self-pay | Admitting: *Deleted

## 2018-04-17 NOTE — Telephone Encounter (Signed)
Patient is calling to report she has been having some bleeding with her stools. Call to office- patient can be seen tomorrow at 3:30- patient notified  Reason for Disposition . MILD rectal bleeding (more than just a few drops or streaks)  Answer Assessment - Initial Assessment Questions 1. COLOR: "What color is it?" "Is that color in part or all of the stool?"     Blood in stool-bright red blood 2. ONSET: "When was the unusual color first noted?"     Monday night 3. CAUSE: "Have you eaten any food or taken any medicine of this color?" (See listing in BACKGROUND)     Patient did have sugar free red candy, BBQ chips 4. OTHER SYMPTOMS: "Do you have any other symptoms?" (e.g., diarrhea, jaundice, abdominal pain, fever).     Pressure in right side- since this has occurred  Answer Assessment - Initial Assessment Questions 1. APPEARANCE of BLOOD: "What color is it?" "Is it passed separately, on the surface of the stool, or mixed in with the stool?"      Appeared to be separate from the stool- on top 2. AMOUNT: "How much blood was passed?"      Monday had more on stool- she had less since- no drops of blood falling from the recum 3. FREQUENCY: "How many times has blood been passed with the stools?"      twice 4. ONSET: "When was the blood first seen in the stools?" (Days or weeks)      Monday 5. DIARRHEA: "Is there also some diarrhea?" If so, ask: "How many diarrhea stools were passed in past 24 hours?"      no 6. CONSTIPATION: "Do you have constipation?" If so, "How bad is it?"     no 7. RECURRENT SYMPTOMS: "Have you had blood in your stools before?" If so, ask: "When was the last time?" and "What happened that time?"      Yes- a little bit once in while- patient has not been seen for it 8. BLOOD THINNERS: "Do you take any blood thinners?" (e.g., Coumadin/warfarin, Pradaxa/dabigatran, aspirin)     no 9. OTHER SYMPTOMS: "Do you have any other symptoms?"  (e.g., abdominal pain, vomiting,  dizziness, fever)     Stomach pressure on R 10. PREGNANCY: "Is there any chance you are pregnant?" "When was your last menstrual period?"       No- LMP- 1 1/2 weeks ago  Protocols used: RECTAL BLEEDING-A-AH, STOOLS - UNUSUAL COLOR-A-AH

## 2018-04-18 ENCOUNTER — Encounter: Payer: Self-pay | Admitting: Family Medicine

## 2018-04-18 ENCOUNTER — Ambulatory Visit: Payer: BLUE CROSS/BLUE SHIELD | Admitting: Family Medicine

## 2018-04-18 VITALS — BP 126/73 | HR 93 | Wt 153.3 lb

## 2018-04-18 DIAGNOSIS — L309 Dermatitis, unspecified: Secondary | ICD-10-CM | POA: Insufficient documentation

## 2018-04-18 DIAGNOSIS — K921 Melena: Secondary | ICD-10-CM

## 2018-04-18 DIAGNOSIS — K640 First degree hemorrhoids: Secondary | ICD-10-CM

## 2018-04-18 LAB — CBC WITH DIFFERENTIAL/PLATELET
HEMOGLOBIN: 13.6 g/dL (ref 11.1–15.9)
Hematocrit: 39.4 % (ref 34.0–46.6)
Lymphocytes Absolute: 1.7 10*3/uL (ref 0.7–3.1)
Lymphs: 23 %
MCH: 31.9 pg (ref 26.6–33.0)
MCHC: 34.5 g/dL (ref 31.5–35.7)
MCV: 93 fL (ref 79–97)
MID (Absolute): 0.8 10*3/uL (ref 0.1–1.6)
MID: 11 %
NEUTROS ABS: 5 10*3/uL (ref 1.4–7.0)
NEUTROS PCT: 66 %
Platelets: 276 10*3/uL (ref 150–450)
RBC: 4.26 x10E6/uL (ref 3.77–5.28)
RDW: 12.6 % (ref 12.3–15.4)
WBC: 7.5 10*3/uL (ref 3.4–10.8)

## 2018-04-18 MED ORDER — HYDROCORTISONE ACETATE 25 MG RE SUPP: 25.0000 mg | Freq: Two times a day (BID) | RECTAL | 0 refills | Status: DC

## 2018-04-18 NOTE — Patient Instructions (Signed)
Hemorrhoids Hemorrhoids are swollen veins in and around the rectum or anus. There are two types of hemorrhoids:  Internal hemorrhoids. These occur in the veins that are just inside the rectum. They may poke through to the outside and become irritated and painful.  External hemorrhoids. These occur in the veins that are outside of the anus and can be felt as a painful swelling or hard lump near the anus.  Most hemorrhoids do not cause serious problems, and they can be managed with home treatments such as diet and lifestyle changes. If home treatments do not help your symptoms, procedures can be done to shrink or remove the hemorrhoids. What are the causes? This condition is caused by increased pressure in the anal area. This pressure may result from various things, including:  Constipation.  Straining to have a bowel movement.  Diarrhea.  Pregnancy.  Obesity.  Sitting for long periods of time.  Heavy lifting or other activity that causes you to strain.  Anal sex.  What are the signs or symptoms? Symptoms of this condition include:  Pain.  Anal itching or irritation.  Rectal bleeding.  Leakage of stool (feces).  Anal swelling.  One or more lumps around the anus.  How is this diagnosed? This condition can often be diagnosed through a visual exam. Other exams or tests may also be done, such as:  Examination of the rectal area with a gloved hand (digital rectal exam).  Examination of the anal canal using a small tube (anoscope).  A blood test, if you have lost a significant amount of blood.  A test to look inside the colon (sigmoidoscopy or colonoscopy).  How is this treated? This condition can usually be treated at home. However, various procedures may be done if dietary changes, lifestyle changes, and other home treatments do not help your symptoms. These procedures can help make the hemorrhoids smaller or remove them completely. Some of these procedures involve  surgery, and others do not. Common procedures include:  Rubber band ligation. Rubber bands are placed at the base of the hemorrhoids to cut off the blood supply to them.  Sclerotherapy. Medicine is injected into the hemorrhoids to shrink them.  Infrared coagulation. A type of light energy is used to get rid of the hemorrhoids.  Hemorrhoidectomy surgery. The hemorrhoids are surgically removed, and the veins that supply them are tied off.  Stapled hemorrhoidopexy surgery. A circular stapling device is used to remove the hemorrhoids and use staples to cut off the blood supply to them.  Follow these instructions at home: Eating and drinking  Eat foods that have a lot of fiber in them, such as whole grains, beans, nuts, fruits, and vegetables. Ask your health care provider about taking products that have added fiber (fiber supplements).  Drink enough fluid to keep your urine clear or pale yellow. Managing pain and swelling  Take warm sitz baths for 20 minutes, 3-4 times a day to ease pain and discomfort.  If directed, apply ice to the affected area. Using ice packs between sitz baths may be helpful. ? Put ice in a plastic bag. ? Place a towel between your skin and the bag. ? Leave the ice on for 20 minutes, 2-3 times a day. General instructions  Take over-the-counter and prescription medicines only as told by your health care provider.  Use medicated creams or suppositories as told.  Exercise regularly.  Go to the bathroom when you have the urge to have a bowel movement. Do not wait.    Avoid straining to have bowel movements.  Keep the anal area dry and clean. Use wet toilet paper or moist towelettes after a bowel movement.  Do not sit on the toilet for long periods of time. This increases blood pooling and pain. Contact a health care provider if:  You have increasing pain and swelling that are not controlled by treatment or medicine.  You have uncontrolled bleeding.  You  have difficulty having a bowel movement, or you are unable to have a bowel movement.  You have pain or inflammation outside the area of the hemorrhoids. This information is not intended to replace advice given to you by your health care provider. Make sure you discuss any questions you have with your health care provider. Document Released: 11/03/2000 Document Revised: 04/05/2016 Document Reviewed: 07/21/2015 Elsevier Interactive Patient Education  2018 [employer]  

## 2018-04-18 NOTE — Telephone Encounter (Signed)
Seen today. 

## 2018-04-18 NOTE — Progress Notes (Signed)
BP 126/73   Pulse 93   Wt 153 lb 4.8 oz (69.5 kg)   SpO2 100%   BMI 30.75 kg/m    Subjective:    Patient ID: Sherry Gonzalez, female    DOB: September 28, 1971, 47 y.o.   MRN: [id number]  HPI: Sherry Gonzalez is a 47 y.o. female  Chief Complaint  Patient presents with  . Blood In Stools    Small amount of red blood, no clots, in stool on Monday and Tuesday night. Patient stated she ate alot of nuts and hard candy before symptoms started. Pt complains of on/off  again pain in lower right side.    RECTAL BLEEDING Duration: 4 days Bright red rectal bleeding: yes  Amount of blood: moderate  Frequency: 2x- not since Melena: no  Spotting on toilet tissue: yes  Anal fullness: no  Perianal pain: no  Severity: mild Perianal irritation/itching: no  Constipation: no  Chronic straining/valsava:  no  Anal trauma/intercourse: no  Hemorrhoids: no  Previous colonoscopy: no   Relevant past medical, surgical, family and social history reviewed and updated as indicated. Interim medical history since our last visit reviewed. Allergies and medications reviewed and updated.  Review of Systems  Constitutional: Negative.   Respiratory: Negative.   Cardiovascular: Negative.   Gastrointestinal: Positive for abdominal distention, abdominal pain (RLQ) and anal bleeding. Negative for blood in stool, constipation, diarrhea, nausea, rectal pain and vomiting.  Genitourinary: Negative.   Musculoskeletal: Negative.   Psychiatric/Behavioral: Negative.     Per HPI unless specifically indicated above     Objective:    BP 126/73   Pulse 93   Wt 153 lb 4.8 oz (69.5 kg)   SpO2 100%   BMI 30.75 kg/m   Wt Readings from Last 3 Encounters:  04/18/18 153 lb 4.8 oz (69.5 kg)  04/05/18 154 lb 6 oz (70 kg)  09/14/17 150 lb 8 oz (68.3 kg)    Physical Exam  Constitutional: She is oriented to person, place, and time. She appears well-developed and well-nourished. No distress.  HENT:  Head: Normocephalic and  atraumatic.  Right Ear: Hearing normal.  Left Ear: Hearing normal.  Nose: Nose normal.  Eyes: Conjunctivae and lids are normal. Right eye exhibits no discharge. Left eye exhibits no discharge. No scleral icterus.  Cardiovascular: Normal rate, regular rhythm, normal heart sounds and intact distal pulses. Exam reveals no gallop and no friction rub.  No murmur heard. Pulmonary/Chest: Effort normal and breath sounds normal. No stridor. No respiratory distress. She has no wheezes. She has no rales. She exhibits no tenderness.  Abdominal: Soft. Bowel sounds are normal. She exhibits no distension and no mass. There is no tenderness. There is no rebound and no guarding. No hernia.  Genitourinary: Rectal exam shows external hemorrhoid. Rectal exam shows no internal hemorrhoid, no fissure, no mass, no tenderness, anal tone normal and guaiac negative stool.  Musculoskeletal: Normal range of motion.  Neurological: She is alert and oriented to person, place, and time.  Skin: Skin is warm, dry and intact. Capillary refill takes less than 2 seconds. No rash noted. She is not diaphoretic. No erythema. No pallor.  Psychiatric: She has a normal mood and affect. Her speech is normal and behavior is normal. Judgment and thought content normal. Cognition and memory are normal.    Results for orders placed or performed in visit on 04/05/18  Microscopic Examination  Result Value Ref Range   WBC, UA 0-5 0 - 5 /hpf   RBC, UA 0-2  0 - 2 /hpf   Epithelial Cells (non renal) 0-10 0 - 10 /hpf   Bacteria, UA None seen None seen/Few  CBC with Differential/Platelet  Result Value Ref Range   WBC 5.8 3.4 - 10.8 x10E3/uL   RBC 4.31 3.77 - 5.28 x10E6/uL   Hemoglobin 13.3 11.1 - 15.9 g/dL   Hematocrit 39.4 34.0 - 46.6 %   MCV 91 79 - 97 fL   MCH 30.9 26.6 - 33.0 pg   MCHC 33.8 31.5 - 35.7 g/dL   RDW 12.8 12.3 - 15.4 %   Platelets 275 150 - 379 x10E3/uL   Neutrophils 66 Not Estab. %   Lymphs 18 Not Estab. %   Monocytes  10 Not Estab. %   Eos 5 Not Estab. %   Basos 1 Not Estab. %   Neutrophils Absolute 3.9 1.4 - 7.0 x10E3/uL   Lymphocytes Absolute 1.0 0.7 - 3.1 x10E3/uL   Monocytes Absolute 0.6 0.1 - 0.9 x10E3/uL   EOS (ABSOLUTE) 0.3 0.0 - 0.4 x10E3/uL   Basophils Absolute 0.0 0.0 - 0.2 x10E3/uL   Immature Granulocytes 0 Not Estab. %   Immature Grans (Abs) 0.0 0.0 - 0.1 x10E3/uL  Comprehensive metabolic panel  Result Value Ref Range   Glucose 85 65 - 99 mg/dL   BUN 15 6 - 24 mg/dL   Creatinine, Ser 0.76 0.57 - 1.00 mg/dL   GFR calc non Af Amer 95 >59 mL/min/1.73   GFR calc Af Amer 110 >59 mL/min/1.73   BUN/Creatinine Ratio 20 9 - 23   Sodium 139 134 - 144 mmol/L   Potassium 3.9 3.5 - 5.2 mmol/L   Chloride 101 96 - 106 mmol/L   CO2 23 20 - 29 mmol/L   Calcium 9.2 8.7 - 10.2 mg/dL   Total Protein 7.5 6.0 - 8.5 g/dL   Albumin 4.5 3.5 - 5.5 g/dL   Globulin, Total 3.0 1.5 - 4.5 g/dL   Albumin/Globulin Ratio 1.5 1.2 - 2.2   Bilirubin Total 1.1 0.0 - 1.2 mg/dL   Alkaline Phosphatase 53 39 - 117 IU/L   AST 19 0 - 40 IU/L   ALT 12 0 - 32 IU/L  Lipid Panel w/o Chol/HDL Ratio  Result Value Ref Range   Cholesterol, Total 156 100 - 199 mg/dL   Triglycerides 72 0 - 149 mg/dL   HDL 62 >39 mg/dL   VLDL Cholesterol Cal 14 5 - 40 mg/dL   LDL Calculated 80 0 - 99 mg/dL  TSH  Result Value Ref Range   TSH 5.970 (H) 0.450 - 4.500 uIU/mL  UA/M w/rflx Culture, Routine  Result Value Ref Range   Specific Gravity, UA <1.005 (L) 1.005 - 1.030   pH, UA 6.0 5.0 - 7.5   Color, UA Yellow Yellow   Appearance Ur Clear Clear   Leukocytes, UA Negative Negative   Protein, UA Negative Negative/Trace   Glucose, UA Negative Negative   Ketones, UA Negative Negative   RBC, UA Trace (A) Negative   Bilirubin, UA Negative Negative   Urobilinogen, Ur 0.2 0.2 - 1.0 mg/dL   Nitrite, UA Negative Negative   Microscopic Examination See below:   VITAMIN D 25 Hydroxy (Vit-D Deficiency, Fractures)  Result Value Ref Range   Vit  D, 25-Hydroxy 37.0 30.0 - 100.0 ng/mL      Assessment & Plan:   Problem List Items Addressed This Visit    None    Visit Diagnoses    Grade I hemorrhoids    -  Primary   Will treat with anusol. Call if not getting better or getting worse. Increase fluids and fiber.    Blood in stool       CBC normal. + hemorrhoid   Relevant Orders   CBC with Differential/Platelet   CBC With Differential/Platelet       Follow up plan: Return if symptoms worsen or fail to improve.

## 2018-04-26 ENCOUNTER — Other Ambulatory Visit: Payer: Self-pay | Admitting: Family Medicine

## 2018-04-26 DIAGNOSIS — Z1231 Encounter for screening mammogram for malignant neoplasm of breast: Secondary | ICD-10-CM

## 2018-05-10 ENCOUNTER — Other Ambulatory Visit (INDEPENDENT_AMBULATORY_CARE_PROVIDER_SITE_OTHER): Payer: BLUE CROSS/BLUE SHIELD

## 2018-05-10 DIAGNOSIS — R7989 Other specified abnormal findings of blood chemistry: Secondary | ICD-10-CM | POA: Diagnosis not present

## 2018-05-11 LAB — THYROID PANEL WITH TSH
FREE THYROXINE INDEX: 1.3 (ref 1.2–4.9)
T3 UPTAKE RATIO: 26 % (ref 24–39)
T4 TOTAL: 5 ug/dL (ref 4.5–12.0)
TSH: 5.3 u[IU]/mL — ABNORMAL HIGH (ref 0.450–4.500)

## 2018-05-13 ENCOUNTER — Telehealth: Payer: Self-pay | Admitting: Family Medicine

## 2018-05-13 DIAGNOSIS — E038 Other specified hypothyroidism: Secondary | ICD-10-CM

## 2018-05-13 DIAGNOSIS — E039 Hypothyroidism, unspecified: Secondary | ICD-10-CM | POA: Insufficient documentation

## 2018-05-13 NOTE — Telephone Encounter (Signed)
Please let Sherry Gonzalez know that her labs came back about the same. Her thyroid is slightly underactive. I can treat this- but she'll have to take a pill 1x a day for the rest of her life. Otherwise, we can watch it and make sure she doesn't have any symptoms and treat it when she does. (fatigue, constipation, weight gain, depression). Let me know. Thanks!

## 2018-05-13 NOTE — Telephone Encounter (Signed)
Patient would like to discuss this with you, she would like further information.

## 2018-05-17 ENCOUNTER — Ambulatory Visit
Admission: RE | Admit: 2018-05-17 | Discharge: 2018-05-17 | Disposition: A | Discharge status: Home / Self Care | Payer: BLUE CROSS/BLUE SHIELD | Source: Ambulatory Visit | Attending: Family Medicine | Admitting: Family Medicine

## 2018-05-17 ENCOUNTER — Encounter: Payer: Self-pay | Admitting: Family Medicine

## 2018-05-17 DIAGNOSIS — Z1231 Encounter for screening mammogram for malignant neoplasm of breast: Secondary | ICD-10-CM | POA: Diagnosis not present

## 2018-05-17 MED ORDER — LEVOTHYROXINE SODIUM 25 MCG PO TABS: 25.0000 ug | ORAL_TABLET | Freq: Every day | ORAL | 1 refills | Status: DC

## 2018-05-17 NOTE — Telephone Encounter (Signed)
Will try medicine for 6 weeks and then follow up.   Please call and schedule appointment.

## 2018-05-17 NOTE — Telephone Encounter (Signed)
Pt called to see if she can speak w/ pcp, or if she can go ahead and call the Rx in and talk later about it

## 2018-06-10 ENCOUNTER — Other Ambulatory Visit: Payer: Self-pay | Admitting: Family Medicine

## 2018-06-10 NOTE — Telephone Encounter (Signed)
Copied from [place] [id number]. Topic: Quick Communication - Rx Refill/Question >> Jun 10, 2018  1:59 PM Waylan Rocher, Lumin L wrote: Medication: levothyroxine (needs new script written for a 3 mos supply and cancel current one)  Has the patient contacted their pharmacy? Yes.   (Agent: If no, request that the patient contact the pharmacy for the refill.) (Agent: If yes, when and what did the pharmacy advise?)  Preferred Pharmacy (with phone number or street name): [facility] [id number] - [place], [place]: Please be advised that RX refills may take up to 3 business days. We ask that you follow-up with your pharmacy.

## 2018-06-11 ENCOUNTER — Telehealth: Payer: Self-pay | Admitting: Family Medicine

## 2018-06-11 NOTE — Telephone Encounter (Signed)
I have several 11:15s that week. OK to put her in one of those.

## 2018-06-11 NOTE — Telephone Encounter (Signed)
Copied from [place] [id number]. Topic: Inquiry >> Jun 10, 2018  1:57 PM Marin Olp L wrote: Reason for CRM: Patient is supposed be seen 6 weeks from 07/01 to check her levels after being on levothyroxine. No appts available in early October with johnson. Please call to discuss work in.  >> Jun 11, 2018  4:47 PM Shaune Pollack wrote: Please advise.  Ok to schedule with another provider or do you want to work her in somewhere   Thanks

## 2018-06-11 NOTE — Telephone Encounter (Signed)
Noted this note per Dr. Wynetta Emery on 05/17/18 re: Thyroid medication:    "Will try medicine for 6 weeks and then follow up.   Please call and schedule appointment."   Attempted to call pt. To inform of the above, and left vm to call to discuss request for 3 mos. Supply of Thyroid medication, if further questions.

## 2018-06-14 ENCOUNTER — Ambulatory Visit: Payer: BLUE CROSS/BLUE SHIELD | Admitting: Family Medicine

## 2018-06-14 ENCOUNTER — Other Ambulatory Visit: Payer: Self-pay

## 2018-06-14 ENCOUNTER — Encounter: Payer: Self-pay | Admitting: Family Medicine

## 2018-06-14 VITALS — BP 122/76 | HR 89 | Temp 98.3°F | Ht 59.0 in | Wt 157.4 lb

## 2018-06-14 DIAGNOSIS — E039 Hypothyroidism, unspecified: Secondary | ICD-10-CM

## 2018-06-14 DIAGNOSIS — Z8269 Family history of other diseases of the musculoskeletal system and connective tissue: Secondary | ICD-10-CM | POA: Diagnosis not present

## 2018-06-14 NOTE — Assessment & Plan Note (Signed)
Family history of Ehlers-Danlos syndrome, would like to see rheumatology for evaluation. Call with any concerns.

## 2018-06-14 NOTE — Progress Notes (Signed)
BP 122/76   Pulse 89   Temp 98.3 F (36.8 C) (Oral)   Ht 4\' 11"  (1.499 m)   Wt 157 lb 6.4 oz (71.4 kg)   SpO2 100%   BMI 31.79 kg/m    Subjective:    Patient ID: Sherry Gonzalez, female    DOB: Mar 20, 1971, 47 y.o.   MRN: [id number]  HPI: Sherry Gonzalez is a 47 y.o. female  Chief Complaint  Patient presents with  . Hypothyroidism   HYPOTHYROIDISM- thinks that she is feeling a little better, but not quite there. She notes that she is still feeling really tired. Thyroid control status:better Satisfied with current treatment? no Medication side effects: no Medication compliance: good compliance Recent dose adjustment:yes Fatigue: yes Cold intolerance: no Heat intolerance: no Weight gain: no Weight loss: no Constipation: no Diarrhea/loose stools: no Palpitations: no Lower extremity edema: no Anxiety/depressed mood: yes  Relevant past medical, surgical, family and social history reviewed and updated as indicated. Interim medical history since our last visit reviewed. Allergies and medications reviewed and updated.  Review of Systems  Constitutional: Negative.   Respiratory: Negative.   Cardiovascular: Negative.   Psychiatric/Behavioral: Negative.     Per HPI unless specifically indicated above     Objective:    BP 122/76   Pulse 89   Temp 98.3 F (36.8 C) (Oral)   Ht 4\' 11"  (1.499 m)   Wt 157 lb 6.4 oz (71.4 kg)   SpO2 100%   BMI 31.79 kg/m   Wt Readings from Last 3 Encounters:  06/14/18 157 lb 6.4 oz (71.4 kg)  04/18/18 153 lb 4.8 oz (69.5 kg)  04/05/18 154 lb 6 oz (70 kg)    Physical Exam  Constitutional: She is oriented to person, place, and time. She appears well-developed and well-nourished. No distress.  HENT:  Head: Normocephalic and atraumatic.  Right Ear: Hearing normal.  Left Ear: Hearing normal.  Nose: Nose normal.  Eyes: Conjunctivae and lids are normal. Right eye exhibits no discharge. Left eye exhibits no discharge. No scleral icterus.    Cardiovascular: Normal rate, regular rhythm, normal heart sounds and intact distal pulses. Exam reveals no gallop and no friction rub.  No murmur heard. Pulmonary/Chest: Effort normal and breath sounds normal. No stridor. No respiratory distress. She has no wheezes. She has no rales. She exhibits no tenderness.  Musculoskeletal: Normal range of motion.  Neurological: She is alert and oriented to person, place, and time.  Skin: Skin is warm, dry and intact. Capillary refill takes less than 2 seconds. No rash noted. She is not diaphoretic. No erythema. No pallor.  Psychiatric: She has a normal mood and affect. Her speech is normal and behavior is normal. Judgment and thought content normal. Cognition and memory are normal.  Nursing note and vitals reviewed.   Results for orders placed or performed in visit on 05/10/18  Thyroid Panel With TSH  Result Value Ref Range   TSH 5.300 (H) 0.450 - 4.500 uIU/mL   T4, Total 5.0 4.5 - 12.0 ug/dL   T3 Uptake Ratio 26 24 - 39 %   Free Thyroxine Index 1.3 1.2 - 4.9      Assessment & Plan:   Problem List Items Addressed This Visit      Endocrine   Hypothyroid - Primary    Doing well on her synthroid. Checking levels today. Will adjust dose as needed. Call with any concerns.       Relevant Orders   Thyroid Panel With TSH  Other   Family history of musculoskeletal disease    Family history of Ehlers-Danlos syndrome, would like to see rheumatology for evaluation. Call with any concerns.        Relevant Orders   Ambulatory referral to Rheumatology       Follow up plan: Return if symptoms worsen or fail to improve.

## 2018-06-14 NOTE — Assessment & Plan Note (Signed)
Doing well on her synthroid. Checking levels today. Will adjust dose as needed. Call with any concerns.

## 2018-06-14 NOTE — Patient Instructions (Signed)
Hypothyroidism Hypothyroidism is a disorder of the thyroid. The thyroid is a large gland that is located in the lower front of the neck. The thyroid releases hormones that control how the body works. With hypothyroidism, the thyroid does not make enough of these hormones. What are the causes? Causes of hypothyroidism may include:  Viral infections.  Pregnancy.  Your own defense system (immune system) attacking your thyroid.  Certain medicines.  Birth defects.  Past radiation treatments to your head or neck.  Past treatment with radioactive iodine.  Past surgical removal of part or all of your thyroid.  Problems with the gland that is located in the center of your brain (pituitary).  What are the signs or symptoms? Signs and symptoms of hypothyroidism may include:  Feeling as though you have no energy (lethargy).  Inability to tolerate cold.  Weight gain that is not explained by a change in diet or exercise habits.  Dry skin.  Coarse hair.  Menstrual irregularity.  Slowing of thought processes.  Constipation.  Sadness or depression.  How is this diagnosed? Your health care provider may diagnose hypothyroidism with blood tests and ultrasound tests. How is this treated? Hypothyroidism is treated with medicine that replaces the hormones that your body does not make. After you begin treatment, it may take several weeks for symptoms to go away. Follow these instructions at home:  Take medicines only as directed by your health care provider.  If you start taking any new medicines, tell your health care provider.  Keep all follow-up visits as directed by your health care provider. This is important. As your condition improves, your dosage needs may change. You will need to have blood tests regularly so that your health care provider can watch your condition. Contact a health care provider if:  Your symptoms do not get better with treatment.  You are taking thyroid  replacement medicine and: ? You sweat excessively. ? You have tremors. ? You feel anxious. ? You lose weight rapidly. ? You cannot tolerate heat. ? You have emotional swings. ? You have diarrhea. ? You feel weak. Get help right away if:  You develop chest pain.  You develop an irregular heartbeat.  You develop a rapid heartbeat. This information is not intended to replace advice given to you by your health care provider. Make sure you discuss any questions you have with your health care provider. Document Released: 11/06/2005 Document Revised: 04/13/2016 Document Reviewed: 03/24/2014 Elsevier Interactive Patient Education  2018 [employer]  

## 2018-06-15 LAB — THYROID PANEL WITH TSH
FREE THYROXINE INDEX: 1.6 (ref 1.2–4.9)
T3 Uptake Ratio: 29 % (ref 24–39)
T4, Total: 5.5 ug/dL (ref 4.5–12.0)
TSH: 3.56 u[IU]/mL (ref 0.450–4.500)

## 2018-06-17 ENCOUNTER — Telehealth: Payer: Self-pay | Admitting: Family Medicine

## 2018-06-17 DIAGNOSIS — E039 Hypothyroidism, unspecified: Secondary | ICD-10-CM

## 2018-06-17 MED ORDER — LEVOTHYROXINE SODIUM 50 MCG PO TABS: 50.0000 ug | ORAL_TABLET | Freq: Every day | ORAL | 1 refills | Status: DC

## 2018-06-17 NOTE — Telephone Encounter (Signed)
Patient notified

## 2018-06-17 NOTE — Telephone Encounter (Signed)
Please let her know that her thyroid is better, but still not between 1-2, so I'm going to increase her to 73mcg daily (she can take 2 of what she's got until she used them up) she should come in for just a lab recheck in 6 weeks. Order in.

## 2018-07-05 DIAGNOSIS — Q796 Ehlers-Danlos syndrome, unspecified: Secondary | ICD-10-CM | POA: Insufficient documentation

## 2018-07-29 ENCOUNTER — Other Ambulatory Visit: Payer: BLUE CROSS/BLUE SHIELD

## 2018-07-29 DIAGNOSIS — E039 Hypothyroidism, unspecified: Secondary | ICD-10-CM

## 2018-07-30 ENCOUNTER — Telehealth: Payer: Self-pay | Admitting: Family Medicine

## 2018-07-30 LAB — THYROID PANEL WITH TSH
Free Thyroxine Index: 1.7 (ref 1.2–4.9)
T3 UPTAKE RATIO: 30 % (ref 24–39)
T4, Total: 5.8 ug/dL (ref 4.5–12.0)
TSH: 2.02 u[IU]/mL (ref 0.450–4.500)

## 2018-07-30 MED ORDER — LEVOTHYROXINE SODIUM 50 MCG PO TABS: 50.0000 ug | ORAL_TABLET | Freq: Every day | ORAL | 3 refills | Status: DC

## 2018-07-30 NOTE — Telephone Encounter (Signed)
Please let her know that her thyroid came back normal at 2. I've sent a years supply to her pharmacy. Thanks!

## 2018-07-30 NOTE — Telephone Encounter (Signed)
Patient notified.  Patient states that she is still having a tiredness in her eyes, the same way that she felt before she started treatment, she is unsure if the medication should be increased or if this is something that is just associated with the thyroid disease, she states that she was feeling much better until last week.

## 2018-07-30 NOTE — Telephone Encounter (Signed)
Her levels came back normal. I'd advise her to keep an eye on it and if she's not doing better we'll recheck, but I don't want to change her dose right now.

## 2018-07-30 NOTE — Telephone Encounter (Signed)
Patient notified

## 2018-08-05 ENCOUNTER — Telehealth: Payer: Self-pay | Admitting: Family Medicine

## 2018-08-05 MED ORDER — LEVOTHYROXINE SODIUM 50 MCG PO TABS: 50.0000 ug | ORAL_TABLET | Freq: Every day | ORAL | 2 refills | Status: DC

## 2018-08-05 NOTE — Telephone Encounter (Signed)
Copied from [place] [phone]. Topic: Quick Communication - See Telephone Encounter >> Aug 05, 2018  8:46 AM Blase Mess A wrote: CRM for notification. See Telephone encounter for: 08/05/18. Patient is calling regarding her levothyroxine (SYNTHROID, LEVOTHROID) 50 MCG tablet [[id number]] She is feels that this medication is still making her tired.  She is requesting a non generic brand is is seeing what Dr. Wynetta Emery thinks about synthroid.  Please advise Patients call back number is [phone]

## 2018-08-05 NOTE — Telephone Encounter (Signed)
Patient notified

## 2018-08-05 NOTE — Telephone Encounter (Signed)
Brand name sent to her pharmacy. Let's see how she does.

## 2018-08-12 MED ORDER — LEVOTHYROXINE SODIUM 50 MCG PO TABS: 25.0000 ug | ORAL_TABLET | Freq: Every day | ORAL | 2 refills | Status: DC

## 2018-08-12 NOTE — Telephone Encounter (Signed)
Pt states that the Synthroid, name brand, seemed to fix the issue she was having with her energy level and feeling tired.  Pt states that though with the name brand medication she is now having problems sleeping.   Pt states she can't sleep because she feels very alert and "wired".  Pt has done some research and found that insomnia is a side effect.   Pt was able to sleep last night because she took half a Xanax and she doesn't want to do that every night. Pt wants to know what the physicians thoughts are on this.  Is there a different medication she could try? Pt can be reached at [phone].

## 2018-08-12 NOTE — Telephone Encounter (Signed)
Can we double check to see if she's taking her medicine in the AM or the PM? Let's have her start taking 1/2 a pill and see how that does for a week- she just might be very sensitive.

## 2018-08-12 NOTE — Telephone Encounter (Signed)
Message relayed to patient. Verbalized understanding and denied questions.   

## 2018-08-12 NOTE — Addendum Note (Signed)
Addended by: Valerie Roys on: 08/12/2018 04:40 PM   Modules accepted: Orders

## 2018-08-19 NOTE — Telephone Encounter (Signed)
Message relayed to patient. Verbalized understanding and denied questions.   

## 2018-08-19 NOTE — Telephone Encounter (Signed)
Let's have her keep taking the 1/2 tab and we'll recheck in 1 month.

## 2018-08-19 NOTE — Telephone Encounter (Signed)
Patient called and wanted to let Dr Wynetta Emery know that she is able to sleep at night now, but still feels tired. She does not have that feeling in her eyes anymore. She doesn't think she is 100% normal feeling. She wants to know does Dr Wynetta Emery want her to take the whole pill again? Call back @ [phone].

## 2018-09-16 ENCOUNTER — Telehealth: Payer: Self-pay | Admitting: Family Medicine

## 2018-09-16 NOTE — Telephone Encounter (Signed)
Copied from [place] [id number]. Topic: Quick Communication - Rx Refill/Question >> Sep 16, 2018  9:08 AM Scherrie Gerlach wrote: Medication: levothyroxine (SYNTHROID, LEVOTHROID) 50 MCG tablet Per Dr Wynetta Emery note on 9/30, pt was to return in 30 days to have her tsh rechecked. Pt states she was told she would have to wait until her 11/22 appt. Pt states she would like to go back on the 1 tab (instead of .5 tab) she is currently taking, even if she cannot have her tsh rechecked at this time. Please advise.

## 2018-09-16 NOTE — Telephone Encounter (Signed)
She can go back on the 1 tab daily, but that will restart the clock on her lab recheck, and it'll need to be another 6 weeks

## 2018-09-16 NOTE — Telephone Encounter (Signed)
Left message on machine for pt to return call to the office. CRM updated.  

## 2018-09-17 NOTE — Telephone Encounter (Signed)
Patient notified

## 2018-10-11 ENCOUNTER — Ambulatory Visit: Payer: BLUE CROSS/BLUE SHIELD | Admitting: Family Medicine

## 2018-11-18 ENCOUNTER — Encounter: Payer: Self-pay | Admitting: Family Medicine

## 2018-11-18 ENCOUNTER — Ambulatory Visit (INDEPENDENT_AMBULATORY_CARE_PROVIDER_SITE_OTHER): Payer: BLUE CROSS/BLUE SHIELD | Admitting: Family Medicine

## 2018-11-18 VITALS — BP 124/81 | HR 96 | Temp 98.7°F | Wt 178.7 lb

## 2018-11-18 DIAGNOSIS — E039 Hypothyroidism, unspecified: Secondary | ICD-10-CM

## 2018-11-18 DIAGNOSIS — F410 Panic disorder [episodic paroxysmal anxiety] without agoraphobia: Secondary | ICD-10-CM | POA: Diagnosis not present

## 2018-11-18 MED ORDER — MIRTAZAPINE 15 MG PO TABS: 15.0000 mg | ORAL_TABLET | Freq: Every day | ORAL | 1 refills | Status: DC

## 2018-11-18 NOTE — Assessment & Plan Note (Signed)
Feeling well. Rechecking levels today. Treat as needed. Call with any concerns.

## 2018-11-18 NOTE — Assessment & Plan Note (Signed)
Stable on her remeron. Does not need refill of her xanax today. Call with any concerns. Refill of remeron given today.

## 2018-11-18 NOTE — Progress Notes (Signed)
BP 124/81   Pulse 96   Temp 98.7 F (37.1 C) (Oral)   Wt 178 lb 11.2 oz (81.1 kg)   LMP 11/04/2018 (Approximate)   SpO2 100%   BMI 36.09 kg/m    Subjective:    Patient ID: Sherry Gonzalez, female    DOB: [birth year], 47 y.o.   MRN: [id number]  HPI: Sherry Gonzalez is a 47 y.o. female  Chief Complaint  Patient presents with  . Depression  . Hypothyroidism   DEPRESSION Mood status: controlled Satisfied with current treatment?: yes Symptom severity: mild  Duration of current treatment : chronic Side effects: yes Medication compliance: excellent compliance Psychotherapy/counseling: no  Previous psychiatric medications: remeron Depressed mood: no Anxious mood: no Anhedonia: no Significant weight loss or gain: yes Insomnia: no  Fatigue: yes Feelings of worthlessness or guilt: no Impaired concentration/indecisiveness: no Suicidal ideations: no Hopelessness: no Crying spells: no Depression screen [facility] 2/9 11/18/2018 06/14/2018 04/05/2018 09/14/2017 03/28/2017  Decreased Interest 0 0 0 0 0  Down, Depressed, Hopeless 1 0 0 0 0  PHQ - 2 Score 1 0 0 0 0  Altered sleeping 0 0 0 - -  Tired, decreased energy 0 1 0 - -  Change in appetite 1 0 2 - -  Feeling bad or failure about yourself  0 0 1 - -  Trouble concentrating 0 0 0 - -  Moving slowly or fidgety/restless 0 0 0 - -  Suicidal thoughts 0 0 0 - -  PHQ-9 Score 2 1 3  - -  Difficult doing work/chores Not difficult at all Not difficult at all Not difficult at all - -   GAD 7 : Generalized Anxiety Score 11/18/2018 06/14/2018 04/05/2018 09/14/2017  Nervous, Anxious, on Edge 0 0 0 0  Control/stop worrying 0 0 0 0  Worry too much - different things 0 0 0 0  Trouble relaxing 0 0 0 0  Restless 0 0 0 0  Easily annoyed or irritable 0 0 0 0  Afraid - awful might happen 0 0 0 0  Total GAD 7 Score 0 0 0 0  Anxiety Difficulty Not difficult at all Not difficult at all Not difficult at all Not difficult at all    HYPOTHYROIDISM- taking 46mcg,  synthroid necessary Thyroid control status:stable Satisfied with current treatment? yes Medication side effects: no Medication compliance: excellent compliance Etiology of hypothyroidism:  Recent dose adjustment:no Fatigue: yes Cold intolerance: no Heat intolerance: no Weight gain: yes Weight loss: no Constipation: no Diarrhea/loose stools: no Palpitations: no Lower extremity edema: no Anxiety/depressed mood: no  Relevant past medical, surgical, family and social history reviewed and updated as indicated. Interim medical history since our last visit reviewed. Allergies and medications reviewed and updated.  Review of Systems  Constitutional: Negative.   Respiratory: Negative.   Cardiovascular: Negative.   Psychiatric/Behavioral: Negative.     Per HPI unless specifically indicated above     Objective:    BP 124/81   Pulse 96   Temp 98.7 F (37.1 C) (Oral)   Wt 178 lb 11.2 oz (81.1 kg)   LMP 11/04/2018 (Approximate)   SpO2 100%   BMI 36.09 kg/m   Wt Readings from Last 3 Encounters:  11/18/18 178 lb 11.2 oz (81.1 kg)  06/14/18 157 lb 6.4 oz (71.4 kg)  04/18/18 153 lb 4.8 oz (69.5 kg)    Physical Exam Vitals signs and nursing note reviewed.  Constitutional:      General: She is not in acute distress.  Appearance: Normal appearance. She is not ill-appearing, toxic-appearing or diaphoretic.  HENT:     Head: Normocephalic and atraumatic.     Right Ear: External ear normal.     Left Ear: External ear normal.     Nose: Nose normal.     Mouth/Throat:     Mouth: Mucous membranes are moist.     Pharynx: Oropharynx is clear.  Eyes:     General: No scleral icterus.       Right eye: No discharge.        Left eye: No discharge.     Extraocular Movements: Extraocular movements intact.     Conjunctiva/sclera: Conjunctivae normal.     Pupils: Pupils are equal, round, and reactive to light.  Neck:     Musculoskeletal: Normal range of motion and neck supple.    Cardiovascular:     Rate and Rhythm: Normal rate and regular rhythm.     Pulses: Normal pulses.     Heart sounds: Normal heart sounds. No murmur. No friction rub. No gallop.   Pulmonary:     Effort: Pulmonary effort is normal. No respiratory distress.     Breath sounds: Normal breath sounds. No stridor. No wheezing, rhonchi or rales.  Chest:     Chest wall: No tenderness.  Musculoskeletal: Normal range of motion.  Skin:    General: Skin is warm and dry.     Capillary Refill: Capillary refill takes less than 2 seconds.     Coloration: Skin is not jaundiced or pale.     Findings: No bruising, erythema, lesion or rash.  Neurological:     General: No focal deficit present.     Mental Status: She is alert and oriented to person, place, and time. Mental status is at baseline.  Psychiatric:        Mood and Affect: Mood normal.        Behavior: Behavior normal.        Thought Content: Thought content normal.        Judgment: Judgment normal.     Results for orders placed or performed in visit on 07/29/18  Thyroid Panel With TSH  Result Value Ref Range   TSH 2.020 0.450 - 4.500 uIU/mL   T4, Total 5.8 4.5 - 12.0 ug/dL   T3 Uptake Ratio 30 24 - 39 %   Free Thyroxine Index 1.7 1.2 - 4.9      Assessment & Plan:   Problem List Items Addressed This Visit      Endocrine   Hypothyroid - Primary    Feeling well. Rechecking levels today. Treat as needed. Call with any concerns.       Relevant Medications   levothyroxine (SYNTHROID) 50 MCG tablet   Other Relevant Orders   Thyroid Panel With TSH     Other   Anxiety disorder    Stable on her remeron. Does not need refill of her xanax today. Call with any concerns. Refill of remeron given today.       Relevant Medications   mirtazapine (REMERON) 15 MG tablet       Follow up plan: Return in about 6 months (around 05/20/2019) for Physical.

## 2018-11-19 ENCOUNTER — Telehealth: Payer: Self-pay | Admitting: Family Medicine

## 2018-11-19 LAB — THYROID PANEL WITH TSH
Free Thyroxine Index: 1.5 (ref 1.2–4.9)
T3 UPTAKE RATIO: 27 % (ref 24–39)
T4 TOTAL: 5.5 ug/dL (ref 4.5–12.0)
TSH: 2.62 u[IU]/mL (ref 0.450–4.500)

## 2018-11-19 MED ORDER — LEVOTHYROXINE SODIUM 50 MCG PO TABS: 50.0000 ug | ORAL_TABLET | Freq: Every day | ORAL | 3 refills | Status: DC

## 2018-11-19 NOTE — Telephone Encounter (Signed)
Please let her know that her thyroid came back normal. I've sent a years supply of her medicine to her pharmacy.

## 2018-11-19 NOTE — Telephone Encounter (Signed)
Message relayed to patient. Verbalized understanding and denied questions.   

## 2019-04-11 ENCOUNTER — Encounter: Payer: Self-pay | Admitting: Family Medicine

## 2019-04-11 ENCOUNTER — Telehealth (INDEPENDENT_AMBULATORY_CARE_PROVIDER_SITE_OTHER): Payer: BLUE CROSS/BLUE SHIELD | Admitting: Family Medicine

## 2019-04-11 ENCOUNTER — Other Ambulatory Visit: Payer: Self-pay

## 2019-04-11 DIAGNOSIS — L219 Seborrheic dermatitis, unspecified: Secondary | ICD-10-CM

## 2019-04-11 DIAGNOSIS — E039 Hypothyroidism, unspecified: Secondary | ICD-10-CM

## 2019-04-11 DIAGNOSIS — F410 Panic disorder [episodic paroxysmal anxiety] without agoraphobia: Secondary | ICD-10-CM | POA: Diagnosis not present

## 2019-04-11 DIAGNOSIS — L719 Rosacea, unspecified: Secondary | ICD-10-CM | POA: Insufficient documentation

## 2019-04-11 MED ORDER — MIRTAZAPINE 15 MG PO TABS: 15.0000 mg | ORAL_TABLET | Freq: Every day | ORAL | 1 refills | Status: DC

## 2019-04-11 MED ORDER — KETOCONAZOLE 1 % EX SHAM: 1.0000 "application " | MEDICATED_SHAMPOO | CUTANEOUS | 2 refills | Status: DC

## 2019-04-11 MED ORDER — ALPRAZOLAM 0.25 MG PO TABS: ORAL_TABLET | ORAL | 0 refills | Status: DC

## 2019-04-11 MED ORDER — METRONIDAZOLE 1 % EX GEL: Freq: Every day | CUTANEOUS | 3 refills | Status: DC

## 2019-04-11 NOTE — Assessment & Plan Note (Signed)
Will treat with metrogel. Call with any concerns. Continue to monitor.

## 2019-04-11 NOTE — Assessment & Plan Note (Signed)
Under good control on current regimen. Continue current regimen. Continue to monitor. Call with any concerns. Refills given. Refill for xanax given, takes very occasionally should last 3-6 months.

## 2019-04-11 NOTE — Progress Notes (Signed)
There were no vitals taken for this visit.   Subjective:    Patient ID: Sherry Gonzalez, female    DOB: 1970/12/31, 48 y.o.   MRN: [id number]  HPI: Sherry Gonzalez is a 48 y.o. female  Chief Complaint  Patient presents with  . Anxiety  . Hypothyroidism   ANXIETY/STRESS Duration:stable Anxious mood: no  Excessive worrying: no Irritability: no  Sweating: no Nausea: no Palpitations:no Hyperventilation: no Panic attacks: no Agoraphobia: no  Obscessions/compulsions: no Depressed mood: no Depression screen [facility] 2/9 04/11/2019 11/18/2018 06/14/2018 04/05/2018 09/14/2017  Decreased Interest 0 0 0 0 0  Down, Depressed, Hopeless 0 1 0 0 0  PHQ - 2 Score 0 1 0 0 0  Altered sleeping 1 0 0 0 -  Tired, decreased energy 0 0 1 0 -  Change in appetite 0 1 0 2 -  Feeling bad or failure about yourself  0 0 0 1 -  Trouble concentrating 0 0 0 0 -  Moving slowly or fidgety/restless 0 0 0 0 -  Suicidal thoughts 0 0 0 0 -  PHQ-9 Score 1 2 1 3  -  Difficult doing work/chores Not difficult at all Not difficult at all Not difficult at all Not difficult at all -   GAD 7 : Generalized Anxiety Score 04/11/2019 11/18/2018 06/14/2018 04/05/2018  Nervous, Anxious, on Edge 0 0 0 0  Control/stop worrying 0 0 0 0  Worry too much - different things 0 0 0 0  Trouble relaxing 0 0 0 0  Restless 0 0 0 0  Easily annoyed or irritable 0 0 0 0  Afraid - awful might happen 0 0 0 0  Total GAD 7 Score 0 0 0 0  Anxiety Difficulty Not difficult at all Not difficult at all Not difficult at all Not difficult at all   Anhedonia: no Weight changes: no Insomnia: no   Hypersomnia: no Fatigue/loss of energy: no Feelings of worthlessness: no Feelings of guilt: no Impaired concentration/indecisiveness: no Suicidal ideations: no  Crying spells: no Recent Stressors/Life Changes: no   Relationship problems: no   Family stress: no     Financial stress: no    Job stress: no    Recent death/loss: no  HYPOTHYROIDISM Thyroid  control status:stable Satisfied with current treatment? yes Medication side effects: no Medication compliance: excellent compliance Recent dose adjustment:no Fatigue: no Cold intolerance: no Heat intolerance: no Weight gain: no Weight loss: no Constipation: no Diarrhea/loose stools: no Palpitations: no Lower extremity edema: no Anxiety/depressed mood: yes  Having a very dry, itchy scalp and redness and itching on her cheeks, worse with washing. Otherwise doing well with no other concerns or complaints at this time.   Relevant past medical, surgical, family and social history reviewed and updated as indicated. Interim medical history since our last visit reviewed. Allergies and medications reviewed and updated.  Review of Systems  Constitutional: Negative.   Respiratory: Negative.   Cardiovascular: Negative.   Musculoskeletal: Negative.   Neurological: Negative.   Psychiatric/Behavioral: Negative.     Per HPI unless specifically indicated above     Objective:    There were no vitals taken for this visit.  Wt Readings from Last 3 Encounters:  11/18/18 178 lb 11.2 oz (81.1 kg)  06/14/18 157 lb 6.4 oz (71.4 kg)  04/18/18 153 lb 4.8 oz (69.5 kg)    Physical Exam Vitals signs and nursing note reviewed.  Constitutional:      General: She is not in acute distress.  Appearance: Normal appearance. She is not ill-appearing, toxic-appearing or diaphoretic.  HENT:     Head: Normocephalic and atraumatic.     Right Ear: External ear normal.     Left Ear: External ear normal.     Nose: Nose normal.     Mouth/Throat:     Mouth: Mucous membranes are moist.     Pharynx: Oropharynx is clear.  Eyes:     General: No scleral icterus.       Right eye: No discharge.        Left eye: No discharge.     Conjunctiva/sclera: Conjunctivae normal.     Pupils: Pupils are equal, round, and reactive to light.  Neck:     Musculoskeletal: Normal range of motion.  Pulmonary:     Effort:  Pulmonary effort is normal. No respiratory distress.     Comments: Speaking in full sentences Musculoskeletal: Normal range of motion.  Skin:    Coloration: Skin is not jaundiced or pale.     Findings: No bruising, erythema, lesion or rash.  Neurological:     Mental Status: She is alert and oriented to person, place, and time. Mental status is at baseline.  Psychiatric:        Mood and Affect: Mood normal.        Behavior: Behavior normal.        Thought Content: Thought content normal.        Judgment: Judgment normal.     Results for orders placed or performed in visit on 11/18/18  Thyroid Panel With TSH  Result Value Ref Range   TSH 2.620 0.450 - 4.500 uIU/mL   T4, Total 5.5 4.5 - 12.0 ug/dL   T3 Uptake Ratio 27 24 - 39 %   Free Thyroxine Index 1.5 1.2 - 4.9      Assessment & Plan:   Problem List Items Addressed This Visit      Endocrine   Hypothyroid - Primary    Under good control on current regimen. Continue current regimen. Continue to monitor. Call with any concerns. Refills given.          Musculoskeletal and Integument   Rosacea    Will treat with metrogel. Call with any concerns. Continue to monitor.         Other   Anxiety disorder    Under good control on current regimen. Continue current regimen. Continue to monitor. Call with any concerns. Refills given. Refill for xanax given, takes very occasionally should last 3-6 months.        Relevant Medications   ALPRAZolam (XANAX) 0.25 MG tablet   mirtazapine (REMERON) 15 MG tablet    Other Visit Diagnoses    Seborrheic dermatitis of scalp       Will treat with ketoconazole shampoo. Let us know if not getting better or getting worse. Continue to monitor.        Follow up plan: Return 3-6 months, for physical.   . This visit was completed via Doximity due to the restrictions of the COVID-19 pandemic. All issues as above were discussed and addressed. Physical exam was done as above through visual  confirmation on Doximity. If it was felt that the patient should be evaluated in the office, they were directed there. The patient verbally consented to this visit. . Location of the patient: home . Location of the provider: home . Those involved with this call:  . Provider: Park Liter, DO . CMA: Yvonna Alanis, [place] . Front Desk/Registration:  Christan Maricela Bo  . Time spent on call: 25 minutes with patient face to face via video conference. More than 50% of this time was spent in counseling and coordination of care. 40 minutes total spent in review of patient's record and preparation of their chart.

## 2019-04-11 NOTE — Assessment & Plan Note (Signed)
Under good control on current regimen. Continue current regimen. Continue to monitor. Call with any concerns. Refills given.   

## 2019-05-07 ENCOUNTER — Other Ambulatory Visit: Payer: Self-pay | Admitting: Family Medicine

## 2019-05-29 ENCOUNTER — Other Ambulatory Visit: Payer: Self-pay | Admitting: Family Medicine

## 2019-05-29 DIAGNOSIS — Z1231 Encounter for screening mammogram for malignant neoplasm of breast: Secondary | ICD-10-CM

## 2019-05-30 ENCOUNTER — Ambulatory Visit: Payer: BLUE CROSS/BLUE SHIELD | Admitting: Family Medicine

## 2019-06-09 ENCOUNTER — Ambulatory Visit
Admission: RE | Admit: 2019-06-09 | Discharge: 2019-06-09 | Disposition: A | Discharge status: Home / Self Care | Payer: BC Managed Care – PPO | Source: Ambulatory Visit | Attending: Family Medicine | Admitting: Family Medicine

## 2019-06-09 ENCOUNTER — Ambulatory Visit (INDEPENDENT_AMBULATORY_CARE_PROVIDER_SITE_OTHER): Payer: Self-pay | Admitting: Nurse Practitioner

## 2019-06-09 ENCOUNTER — Other Ambulatory Visit: Payer: Self-pay

## 2019-06-09 ENCOUNTER — Encounter: Payer: Self-pay | Admitting: Nurse Practitioner

## 2019-06-09 ENCOUNTER — Ambulatory Visit: Payer: BLUE CROSS/BLUE SHIELD | Admitting: Nurse Practitioner

## 2019-06-09 DIAGNOSIS — Z1231 Encounter for screening mammogram for malignant neoplasm of breast: Secondary | ICD-10-CM | POA: Insufficient documentation

## 2019-06-09 DIAGNOSIS — L309 Dermatitis, unspecified: Secondary | ICD-10-CM

## 2019-06-09 NOTE — Progress Notes (Signed)
Ht 4\' 11"  (1.499 m)   LMP 06/02/2019   BMI 36.09 kg/m    Subjective:    Patient ID: Sherry Gonzalez, female    DOB: [birth year], 48 y.o.   MRN: [id number]  HPI: Sherry Gonzalez is a 48 y.o. female  Chief Complaint  Patient presents with  . Rash    x 2 weeks. bilateral legs and arms and abdomen. itchy.     . This visit was completed via Doximity due to the restrictions of the COVID-19 pandemic. All issues as above were discussed and addressed. Physical exam was done as above through visual confirmation on Doximity. If it was felt that the patient should be evaluated in the office, they were directed there. The patient verbally consented to this visit. . Location of the patient: home . Location of the provider: work . Those involved with this call:  . Provider: Marnee Guarneri, DNP . CMA: Lesle Chris, [place] . Front Desk/Registration: Jill Side  . Time spent on call: 15 minutes with patient face to face via video conference. More than 50% of this time was spent in counseling and coordination of care. 5 minutes total spent in review of patient's record and preparation of their chart.  . I verified patient identity using two factors (patient name and date of birth). Patient consents verbally to being seen via telemedicine visit today.    RASH  Two weeks ago woke-up one Sunday morning and had "spots" on legs.  Endorses sensitive skin issues.  In morning bumps present and during day time it goes away.  Happening every single day, some days worse than others.  This past Friday, was on treadmill and bumps were present again after she got off treadmill.  Had spot to left side as well, which she reports as bigger red spot.  Before the two weeks she does recall a rash being present a circular rash to legs.  Denies recent tick bites.  Has dog, but does report dog does not have fleas.  Reports this is better today, "bad day to show you".   Duration:  weeks  Location: to lower legs bilateral and left side  Itching: yes Burning: no Redness: yes Oozing: no Scaling: no Blisters: no Painful: no Fevers: no Change in detergents/soaps/personal care products: no Recent illness: no Recent travel:no History of same: no Context: stable Alleviating factors: nothing Treatments attempted:nothing Shortness of breath: no  Throat/tongue swelling: no Myalgias/arthralgias: no  Relevant past medical, surgical, family and social history reviewed and updated as indicated. Interim medical history since our last visit reviewed. Allergies and medications reviewed and updated.  Review of Systems  Constitutional: Negative for activity change, appetite change, diaphoresis, fatigue and fever.  Respiratory: Negative for cough, chest tightness and shortness of breath.   Cardiovascular: Negative for chest pain, palpitations and leg swelling.  Gastrointestinal: Negative for abdominal distention, abdominal pain, constipation, diarrhea, nausea and vomiting.  Endocrine: Negative for polydipsia.  Skin: Positive for rash.  Neurological: Negative for dizziness, syncope, weakness, light-headedness, numbness and headaches.  Psychiatric/Behavioral: Negative.     Per HPI unless specifically indicated above     Objective:    Ht 4\' 11"  (1.499 m)   LMP 06/02/2019   BMI 36.09 kg/m   Wt Readings from Last 3 Encounters:  11/18/18 178 lb 11.2 oz (81.1 kg)  06/14/18 157 lb 6.4 oz (71.4 kg)  04/18/18 153 lb 4.8 oz (69.5 kg)    Physical Exam Vitals signs and nursing note reviewed.  Constitutional:  General: She is awake. She is not in acute distress.    Appearance: She is well-developed. She is not ill-appearing.  HENT:     Head: Normocephalic.     Right Ear: Hearing normal.     Left Ear: Hearing normal.  Eyes:     General: Lids are normal.        Right eye: No discharge.        Left eye: No discharge.     Conjunctiva/sclera: Conjunctivae normal.  Neck:     Musculoskeletal: Normal range of motion.   Pulmonary:     Effort: Pulmonary effort is normal. No accessory muscle usage or respiratory distress.  Skin:    Findings: Rash present. Rash is macular and papular.     Comments: Mild erythema noted to left lower arm, no blisters or vesicle.  No rash to lower legs on exam today.  Neurological:     Mental Status: She is alert and oriented to person, place, and time.  Psychiatric:        Attention and Perception: Attention normal.        Mood and Affect: Mood normal.        Behavior: Behavior normal. Behavior is cooperative.        Thought Content: Thought content normal.        Judgment: Judgment normal.     Results for orders placed or performed in visit on 11/18/18  Thyroid Panel With TSH  Result Value Ref Range   TSH 2.620 0.450 - 4.500 uIU/mL   T4, Total 5.5 4.5 - 12.0 ug/dL   T3 Uptake Ratio 27 24 - 39 %   Free Thyroxine Index 1.5 1.2 - 4.9      Assessment & Plan:   Problem List Items Addressed This Visit      Musculoskeletal and Integument   Dermatitis    Improved on exam today per patient report.  Recommend trying Triamcinolone cream to areas when present and taking Claritin or Allegra for itching.  Return for worsening or continued issues.         I discussed the assessment and treatment plan with the patient. The patient was provided an opportunity to ask questions and all were answered. The patient agreed with the plan and demonstrated an understanding of the instructions.   The patient was advised to call back or seek an in-person evaluation if the symptoms worsen or if the condition fails to improve as anticipated.   I provided 15 minutes of time during this encounter.  Follow up plan: Return if symptoms worsen or fail to improve.

## 2019-06-09 NOTE — Assessment & Plan Note (Signed)
Improved on exam today per patient report.  Recommend trying Triamcinolone cream to areas when present and taking Claritin or Allegra for itching.  Return for worsening or continued issues.

## 2019-06-09 NOTE — Patient Instructions (Signed)

## 2019-06-11 DIAGNOSIS — Z8269 Family history of other diseases of the musculoskeletal system and connective tissue: Secondary | ICD-10-CM | POA: Diagnosis not present

## 2019-06-19 DIAGNOSIS — Z8489 Family history of other specified conditions: Secondary | ICD-10-CM | POA: Diagnosis not present

## 2019-07-11 ENCOUNTER — Encounter: Payer: BLUE CROSS/BLUE SHIELD | Admitting: Family Medicine

## 2019-08-26 ENCOUNTER — Ambulatory Visit (INDEPENDENT_AMBULATORY_CARE_PROVIDER_SITE_OTHER): Payer: BC Managed Care – PPO | Admitting: Family Medicine

## 2019-08-26 ENCOUNTER — Other Ambulatory Visit (HOSPITAL_COMMUNITY)
Admission: RE | Admit: 2019-08-26 | Discharge: 2019-08-26 | Disposition: A | Discharge status: Home / Self Care | Payer: BC Managed Care – PPO | Source: Ambulatory Visit | Attending: Family Medicine | Admitting: Family Medicine

## 2019-08-26 ENCOUNTER — Encounter: Payer: Self-pay | Admitting: Family Medicine

## 2019-08-26 ENCOUNTER — Other Ambulatory Visit: Payer: Self-pay

## 2019-08-26 VITALS — BP 132/76 | HR 121 | Temp 98.7°F | Ht 59.7 in | Wt 181.0 lb

## 2019-08-26 DIAGNOSIS — E039 Hypothyroidism, unspecified: Secondary | ICD-10-CM | POA: Diagnosis not present

## 2019-08-26 DIAGNOSIS — F410 Panic disorder [episodic paroxysmal anxiety] without agoraphobia: Secondary | ICD-10-CM | POA: Diagnosis not present

## 2019-08-26 DIAGNOSIS — Z23 Encounter for immunization: Secondary | ICD-10-CM

## 2019-08-26 DIAGNOSIS — Z114 Encounter for screening for human immunodeficiency virus [HIV]: Secondary | ICD-10-CM | POA: Diagnosis not present

## 2019-08-26 DIAGNOSIS — Z0000 Encounter for general adult medical examination without abnormal findings: Secondary | ICD-10-CM | POA: Diagnosis not present

## 2019-08-26 DIAGNOSIS — Z124 Encounter for screening for malignant neoplasm of cervix: Secondary | ICD-10-CM | POA: Insufficient documentation

## 2019-08-26 LAB — UA/M W/RFLX CULTURE, ROUTINE
Bilirubin, UA: NEGATIVE
Glucose, UA: NEGATIVE
Ketones, UA: NEGATIVE
Leukocytes,UA: NEGATIVE
Nitrite, UA: NEGATIVE
Protein,UA: NEGATIVE
Specific Gravity, UA: 1.01 (ref 1.005–1.030)
Urobilinogen, Ur: 0.2 mg/dL (ref 0.2–1.0)
pH, UA: 7 (ref 5.0–7.5)

## 2019-08-26 LAB — MICROSCOPIC EXAMINATION

## 2019-08-26 MED ORDER — ALPRAZOLAM 0.25 MG PO TABS: ORAL_TABLET | ORAL | 0 refills | Status: DC

## 2019-08-26 MED ORDER — MIRTAZAPINE 15 MG PO TABS: 15.0000 mg | ORAL_TABLET | Freq: Every day | ORAL | 1 refills | Status: DC

## 2019-08-26 NOTE — Progress Notes (Signed)
BP 132/76   Pulse (!) 121   Temp 98.7 F (37.1 C) (Oral)   Ht 4' 11.7" (1.516 m)   Wt 181 lb (82.1 kg)   SpO2 100%   BMI 35.71 kg/m    Subjective:    Patient ID: Sherry Gonzalez, female    DOB: [birth year], 48 y.o.   MRN: [id number]  HPI: Sherry Gonzalez is a 49 y.o. female presenting on 08/26/2019 for comprehensive medical examination. Current medical complaints include:  ANXIETY/STRESS Duration:stable Anxious mood: yes  Excessive worrying: no Irritability: no  Sweating: no Nausea: no Palpitations:no Hyperventilation: no Panic attacks: no Agoraphobia: no  Obscessions/compulsions: no Depressed mood: no Depression screen [facility] 2/9 08/26/2019 04/11/2019 11/18/2018 06/14/2018 04/05/2018  Decreased Interest 0 0 0 0 0  Down, Depressed, Hopeless 0 0 1 0 0  PHQ - 2 Score 0 0 1 0 0  Altered sleeping 0 1 0 0 0  Tired, decreased energy 0 0 0 1 0  Change in appetite 0 0 1 0 2  Feeling bad or failure about yourself  0 0 0 0 1  Trouble concentrating 0 0 0 0 0  Moving slowly or fidgety/restless 0 0 0 0 0  Suicidal thoughts 0 0 0 0 0  PHQ-9 Score 0 1 2 1 3   Difficult doing work/chores Not difficult at all Not difficult at all Not difficult at all Not difficult at all Not difficult at all   GAD 7 : Generalized Anxiety Score 08/26/2019 04/11/2019 11/18/2018 06/14/2018  Nervous, Anxious, on Edge 0 0 0 0  Control/stop worrying 0 0 0 0  Worry too much - different things 0 0 0 0  Trouble relaxing 0 0 0 0  Restless 0 0 0 0  Easily annoyed or irritable 0 0 0 0  Afraid - awful might happen 0 0 0 0  Total GAD 7 Score 0 0 0 0  Anxiety Difficulty Not difficult at all Not difficult at all Not difficult at all Not difficult at all   Anhedonia: no Weight changes: yes Insomnia: yes hard to stay asleep  Hypersomnia: no Fatigue/loss of energy: no Feelings of worthlessness: no Feelings of guilt: no Impaired concentration/indecisiveness: no Suicidal ideations: no  Crying spells: no Recent Stressors/Life  Changes: yes   Relationship problems: no   Family stress: yes     Financial stress: no    Job stress: yes    Recent death/loss: yes  HYPOTHYROIDISM Thyroid control status:controlled Satisfied with current treatment? yes Medication side effects: no Medication compliance: excellent compliance Recent dose adjustment:no Fatigue: no Cold intolerance: no Heat intolerance: no Weight gain: yes Weight loss: no Constipation: no Diarrhea/loose stools: no Palpitations: no Lower extremity edema: no Anxiety/depressed mood: yes  Menopausal Symptoms: no  Depression Screen done today and results listed below:  Depression screen [facility] 2/9 08/26/2019 04/11/2019 11/18/2018 06/14/2018 04/05/2018  Decreased Interest 0 0 0 0 0  Down, Depressed, Hopeless 0 0 1 0 0  PHQ - 2 Score 0 0 1 0 0  Altered sleeping 0 1 0 0 0  Tired, decreased energy 0 0 0 1 0  Change in appetite 0 0 1 0 2  Feeling bad or failure about yourself  0 0 0 0 1  Trouble concentrating 0 0 0 0 0  Moving slowly or fidgety/restless 0 0 0 0 0  Suicidal thoughts 0 0 0 0 0  PHQ-9 Score 0 1 2 1 3   Difficult doing work/chores Not difficult at all Not difficult at  all Not difficult at all Not difficult at all Not difficult at all    Past Medical History:  Past Medical History:  Diagnosis Date  . Allergy-induced asthma   . Carpal tunnel syndrome, left    left upper limbt  . Carpal tunnel syndrome, right    right upper limb  . Eustachian tube disorder    left ear  . Hand eczema   . Mild intermittent asthma   . Trigger ring finger of left hand     Surgical History:  Past Surgical History:  Procedure Laterality Date  . WISDOM TOOTH EXTRACTION      Medications:  Current Outpatient Medications on File Prior to Visit  Medication Sig  . acetaminophen (TYLENOL) 325 MG tablet Take 325 mg by mouth as needed.  Marland Kitchen albuterol (VENTOLIN HFA) 108 (90 Base) MCG/ACT inhaler INHALE 2 PUFFS INTO THE LUNGS EVERY 6 HOURS AS NEEDED FOR WHEEZING  OR SHORTNESS OF BREATH  . KETOCONAZOLE, TOPICAL, 1 % SHAM Apply 1 application topically 2 (two) times a week.  . levothyroxine (SYNTHROID) 50 MCG tablet Take 50 mcg by mouth daily before breakfast.  . Loratadine (CLARITIN PO) Take 10 mg by mouth daily.   . SF 1.1 % GEL dental gel BRUSH ON TEETH AT BEDTIME AND DO NOT RINSE  . triamcinolone cream (KENALOG) 0.1 % Apply 1 application topically 2 (two) times daily.  . metroNIDAZOLE (METROGEL) 1 % gel Apply topically daily. (Patient not taking: Reported on 08/26/2019)   No current facility-administered medications on file prior to visit.     Allergies:  No Known Allergies  Social History:  Social History   Socioeconomic History  . Marital status: Single    Spouse name: Not on file  . Number of children: Not on file  . Years of education: Not on file  . Highest education level: Not on file  Occupational History  . Not on file  Social Needs  . Financial resource strain: Not on file  . Food insecurity    Worry: Not on file    Inability: Not on file  . Transportation needs    Medical: Not on file    Non-medical: Not on file  Tobacco Use  . Smoking status: Never Smoker  . Smokeless tobacco: Never Used  Substance and Sexual Activity  . Alcohol use: No    Alcohol/week: 0.0 standard drinks  . Drug use: No  . Sexual activity: Never  Lifestyle  . Physical activity    Days per week: Not on file    Minutes per session: Not on file  . Stress: Not on file  Relationships  . Social Herbalist on phone: Not on file    Gets together: Not on file    Attends religious service: Not on file    Active member of club or organization: Not on file    Attends meetings of clubs or organizations: Not on file    Relationship status: Not on file  . Intimate partner violence    Fear of current or ex partner: Not on file    Emotionally abused: Not on file    Physically abused: Not on file    Forced sexual activity: Not on file  Other  Topics Concern  . Not on file  Social History Narrative  . Not on file   Social History   Tobacco Use  Smoking Status Never Smoker  Smokeless Tobacco Never Used   Social History   Substance and Sexual  Activity  Alcohol Use No  . Alcohol/week: 0.0 standard drinks    Family History:  Family History  Problem Relation Age of Onset  . Alcohol abuse Father   . Ehlers-Danlos syndrome Mother        Mothers side of the family  . Migraines Sister   . Breast cancer Neg Hx     Past medical history, surgical history, medications, allergies, family history and social history reviewed with patient today and changes made to appropriate areas of the chart.   Review of Systems  Constitutional: Negative.   HENT: Negative.   Eyes: Negative.   Respiratory: Negative.   Cardiovascular: Negative.   Gastrointestinal: Positive for heartburn (under good control with OTC). Negative for abdominal pain, blood in stool, constipation, diarrhea, melena, nausea and vomiting.  Genitourinary: Negative.   Musculoskeletal: Negative.   Skin: Negative.   Neurological: Negative.   Endo/Heme/Allergies: Negative.  Negative for environmental allergies and polydipsia. Does not bruise/bleed easily.  Psychiatric/Behavioral: Negative for depression, hallucinations, memory loss, substance abuse and suicidal ideas. The patient is nervous/anxious and has insomnia.     All other ROS negative except what is listed above and in the HPI.      Objective:    BP 132/76   Pulse (!) 121   Temp 98.7 F (37.1 C) (Oral)   Ht 4' 11.7" (1.516 m)   Wt 181 lb (82.1 kg)   SpO2 100%   BMI 35.71 kg/m   Wt Readings from Last 3 Encounters:  08/26/19 181 lb (82.1 kg)  11/18/18 178 lb 11.2 oz (81.1 kg)  06/14/18 157 lb 6.4 oz (71.4 kg)    Physical Exam Vitals signs and nursing note reviewed. Exam conducted with a chaperone present.  Constitutional:      General: She is not in acute distress.    Appearance: Normal  appearance. She is not ill-appearing, toxic-appearing or diaphoretic.  HENT:     Head: Normocephalic and atraumatic.     Right Ear: Tympanic membrane, ear canal and external ear normal. There is no impacted cerumen.     Left Ear: Tympanic membrane, ear canal and external ear normal. There is no impacted cerumen.     Nose: Nose normal. No congestion or rhinorrhea.     Mouth/Throat:     Mouth: Mucous membranes are moist.     Pharynx: Oropharynx is clear. No oropharyngeal exudate or posterior oropharyngeal erythema.  Eyes:     General: No scleral icterus.       Right eye: No discharge.        Left eye: No discharge.     Extraocular Movements: Extraocular movements intact.     Conjunctiva/sclera: Conjunctivae normal.     Pupils: Pupils are equal, round, and reactive to light.  Neck:     Musculoskeletal: Normal range of motion and neck supple. No neck rigidity or muscular tenderness.     Vascular: No carotid bruit.  Cardiovascular:     Rate and Rhythm: Normal rate and regular rhythm.     Pulses: Normal pulses.     Heart sounds: No murmur. No friction rub. No gallop.   Pulmonary:     Effort: Pulmonary effort is normal. No respiratory distress.     Breath sounds: Normal breath sounds. No stridor. No wheezing, rhonchi or rales.  Chest:     Chest wall: No tenderness.     Breasts:        Right: Normal. No swelling, bleeding, inverted nipple, mass, nipple discharge, skin change  or tenderness.        Left: Normal. No swelling, bleeding, inverted nipple, mass, nipple discharge, skin change or tenderness.  Abdominal:     General: Abdomen is flat. Bowel sounds are normal. There is no distension.     Palpations: Abdomen is soft. There is no mass.     Tenderness: There is no abdominal tenderness. There is no right CVA tenderness, left CVA tenderness, guarding or rebound.     Hernia: No hernia is present.  Genitourinary:    General: Normal vulva.     Labia:        Right: No rash, tenderness,  lesion or injury.        Left: No rash, tenderness, lesion or injury.      Urethra: No prolapse, urethral pain, urethral swelling or urethral lesion.     Vagina: No signs of injury and foreign body. No vaginal discharge, erythema, tenderness, bleeding, lesions or prolapsed vaginal walls.     Cervix: Cervical bleeding (due to start menses tomorrow) present. No cervical motion tenderness, discharge, friability, lesion, erythema or eversion.  Musculoskeletal:        General: No swelling, tenderness, deformity or signs of injury.     Right lower leg: No edema.     Left lower leg: No edema.  Lymphadenopathy:     Cervical: No cervical adenopathy.     Upper Body:     Right upper body: No supraclavicular, axillary or pectoral adenopathy.     Left upper body: No supraclavicular, axillary or pectoral adenopathy.  Skin:    General: Skin is warm and dry.     Capillary Refill: Capillary refill takes less than 2 seconds.     Coloration: Skin is not jaundiced or pale.     Findings: No bruising, erythema, lesion or rash.  Neurological:     General: No focal deficit present.     Mental Status: She is alert and oriented to person, place, and time. Mental status is at baseline.     Cranial Nerves: No cranial nerve deficit.     Sensory: No sensory deficit.     Motor: No weakness.     Coordination: Coordination normal.     Gait: Gait normal.     Deep Tendon Reflexes: Reflexes normal.  Psychiatric:        Mood and Affect: Mood normal.        Behavior: Behavior normal.        Thought Content: Thought content normal.        Judgment: Judgment normal.     Results for orders placed or performed in visit on 11/18/18  Thyroid Panel With TSH  Result Value Ref Range   TSH 2.620 0.450 - 4.500 uIU/mL   T4, Total 5.5 4.5 - 12.0 ug/dL   T3 Uptake Ratio 27 24 - 39 %   Free Thyroxine Index 1.5 1.2 - 4.9      Assessment & Plan:   Problem List Items Addressed This Visit      Endocrine   Hypothyroid     Rechecking levels today. Adjust dose as needed. Call with any concerns.         Other   Anxiety disorder    Under good control on current regimen. Continue current regimen. Continue to monitor. Call with any concerns. Refills given. Rx for xanax should last 6 months.        Relevant Medications   mirtazapine (REMERON) 15 MG tablet   ALPRAZolam (XANAX) 0.25 MG  tablet    Other Visit Diagnoses    Routine general medical examination at a health care facility    -  Primary   Vaccines up to date. Screening labs checked today. Mammogram up to date. Pap done. Continue diet and exercise. Call with any concerns.    Relevant Orders   CBC with Differential/Platelet   Comprehensive metabolic panel   Lipid Panel w/o Chol/HDL Ratio   TSH   UA/M w/rflx Culture, Routine   VITAMIN D 25 Hydroxy (Vit-D Deficiency, Fractures)   Encounter for screening for HIV       Labs drawn today. Await results.    Relevant Orders   HIV Antibody (routine testing w rflx)   Screening for cervical cancer       Pap done today.   Relevant Orders   Cytology - PAP   Flu vaccine need       Flu shot given today.   Relevant Orders   Flu Vaccine QUAD 36+ mos IM       Follow up plan: Return in about 6 months (around 02/24/2020).   LABORATORY TESTING:  - Pap smear: pap done  IMMUNIZATIONS:   - Tdap: Tetanus vaccination status reviewed: last tetanus booster within 10 years. - Influenza: Administered today - Pneumovax: Not applicable  SCREENING: -Mammogram: Up to date   PATIENT COUNSELING:   Advised to take 1 mg of folate supplement per day if capable of pregnancy.   Sexuality: Discussed sexually transmitted diseases, partner selection, use of condoms, avoidance of unintended pregnancy  and contraceptive alternatives.   Advised to avoid cigarette smoking.  I discussed with the patient that most people either abstain from alcohol or drink within safe limits (<=14/week and <=4 drinks/occasion for males,  <=7/weeks and <= 3 drinks/occasion for females) and that the risk for alcohol disorders and other health effects rises proportionally with the number of drinks per week and how often a drinker exceeds daily limits.  Discussed cessation/primary prevention of drug use and availability of treatment for abuse.   Diet: Encouraged to adjust caloric intake to maintain  or achieve ideal body weight, to reduce intake of dietary saturated fat and total fat, to limit sodium intake by avoiding high sodium foods and not adding table salt, and to maintain adequate dietary potassium and calcium preferably from fresh fruits, vegetables, and low-fat dairy products.    stressed the importance of regular exercise  Injury prevention: Discussed safety belts, safety helmets, smoke detector, smoking near bedding or upholstery.   Dental health: Discussed importance of regular tooth brushing, flossing, and dental visits.    NEXT PREVENTATIVE PHYSICAL DUE IN 1 YEAR. Return in about 6 months (around 02/24/2020).

## 2019-08-26 NOTE — Assessment & Plan Note (Signed)
Rechecking levels today. Adjust dose as needed. Call with any concerns.

## 2019-08-26 NOTE — Assessment & Plan Note (Signed)
Under good control on current regimen. Continue current regimen. Continue to monitor. Call with any concerns. Refills given. Rx for xanax should last 6 months.

## 2019-08-26 NOTE — Patient Instructions (Signed)
Health Maintenance, Female Adopting a healthy lifestyle and getting preventive care are important in promoting health and wellness. Ask your health care provider about:  The right schedule for you to have regular tests and exams.  Things you can do on your own to prevent diseases and keep yourself healthy. What should I know about diet, weight, and exercise? Eat a healthy diet   Eat a diet that includes plenty of vegetables, fruits, low-fat dairy products, and lean protein.  Do not eat a lot of foods that are high in solid fats, added sugars, or sodium. Maintain a healthy weight Body mass index (BMI) is used to identify weight problems. It estimates body fat based on height and weight. Your health care provider can help determine your BMI and help you achieve or maintain a healthy weight. Get regular exercise Get regular exercise. This is one of the most important things you can do for your health. Most adults should:  Exercise for at least 150 minutes each week. The exercise should increase your heart rate and make you sweat (moderate-intensity exercise).  Do strengthening exercises at least twice a week. This is in addition to the moderate-intensity exercise.  Spend less time sitting. Even light physical activity can be beneficial. Watch cholesterol and blood lipids Have your blood tested for lipids and cholesterol at 46 years of age, then have this test every 5 years. Have your cholesterol levels checked more often if:  Your lipid or cholesterol levels are high.  You are older than 46 years of age.  You are at high risk for heart disease. What should I know about cancer screening? Depending on your health history and family history, you may need to have cancer screening at various ages. This may include screening for:  Breast cancer.  Cervical cancer.  Colorectal cancer.  Skin cancer.  Lung cancer. What should I know about heart disease, diabetes, and high blood  pressure? Blood pressure and heart disease  High blood pressure causes heart disease and increases the risk of stroke. This is more likely to develop in people who have high blood pressure readings, are of African descent, or are overweight.  Have your blood pressure checked: ? Every 3-5 years if you are 18-39 years of age. ? Every year if you are 40 years old or older. Diabetes Have regular diabetes screenings. This checks your fasting blood sugar level. Have the screening done:  Once every three years after age 40 if you are at a normal weight and have a low risk for diabetes.  More often and at a younger age if you are overweight or have a high risk for diabetes. What should I know about preventing infection? Hepatitis B If you have a higher risk for hepatitis B, you should be screened for this virus. Talk with your health care provider to find out if you are at risk for hepatitis B infection. Hepatitis C Testing is recommended for:  Everyone born from 1945 through 1965.  Anyone with known risk factors for hepatitis C. Sexually transmitted infections (STIs)  Get screened for STIs, including gonorrhea and chlamydia, if: ? You are sexually active and are younger than 46 years of age. ? You are older than 46 years of age and your health care provider tells you that you are at risk for this type of infection. ? Your sexual activity has changed since you were last screened, and you are at increased risk for chlamydia or gonorrhea. Ask your health care provider if   you are at risk.  Ask your health care provider about whether you are at high risk for HIV. Your health care provider may recommend a prescription medicine to help prevent HIV infection. If you choose to take medicine to prevent HIV, you should first get tested for HIV. You should then be tested every 3 months for as long as you are taking the medicine. Pregnancy  If you are about to stop having your period (premenopausal) and  you may become pregnant, seek counseling before you get pregnant.  Take 400 to 800 micrograms (mcg) of folic acid every day if you become pregnant.  Ask for birth control (contraception) if you want to prevent pregnancy. Osteoporosis and menopause Osteoporosis is a disease in which the bones lose minerals and strength with aging. This can result in bone fractures. If you are 65 years old or older, or if you are at risk for osteoporosis and fractures, ask your health care provider if you should:  Be screened for bone loss.  Take a calcium or vitamin D supplement to lower your risk of fractures.  Be given hormone replacement therapy (HRT) to treat symptoms of menopause. Follow these instructions at home: Lifestyle  Do not use any products that contain nicotine or tobacco, such as cigarettes, e-cigarettes, and chewing tobacco. If you need help quitting, ask your health care provider.  Do not use street drugs.  Do not share needles.  Ask your health care provider for help if you need support or information about quitting drugs. Alcohol use  Do not drink alcohol if: ? Your health care provider tells you not to drink. ? You are pregnant, may be pregnant, or are planning to become pregnant.  If you drink alcohol: ? Limit how much you use to 0-1 drink a day. ? Limit intake if you are breastfeeding.  Be aware of how much alcohol is in your drink. In the [place] one drink equals one 12 oz bottle of beer (355 mL), one 5 oz glass of wine (148 mL), or one 1 oz glass of hard liquor (44 mL). General instructions  Schedule regular health, dental, and eye exams.  Stay current with your vaccines.  Tell your health care provider if: ? You often feel depressed. ? You have ever been abused or do not feel safe at home. Summary  Adopting a healthy lifestyle and getting preventive care are important in promoting health and wellness.  Follow your health care provider's instructions about healthy  diet, exercising, and getting tested or screened for diseases.  Follow your health care provider's instructions on monitoring your cholesterol and blood pressure. This information is not intended to replace advice given to you by your health care provider. Make sure you discuss any questions you have with your health care provider. Document Released: 05/22/2011 Document Revised: 10/30/2018 Document Reviewed: 10/30/2018 Elsevier Patient Education  2020 [employer] Inc[employer]  

## 2019-08-27 LAB — COMPREHENSIVE METABOLIC PANEL
ALT: 11 IU/L (ref 0–32)
AST: 17 IU/L (ref 0–40)
Albumin/Globulin Ratio: 1.5 (ref 1.2–2.2)
Albumin: 4.2 g/dL (ref 3.8–4.8)
Alkaline Phosphatase: 78 IU/L (ref 39–117)
BUN/Creatinine Ratio: 12 (ref 9–23)
BUN: 9 mg/dL (ref 6–24)
Bilirubin Total: 0.7 mg/dL (ref 0.0–1.2)
CO2: 25 mmol/L (ref 20–29)
Calcium: 9.1 mg/dL (ref 8.7–10.2)
Chloride: 99 mmol/L (ref 96–106)
Creatinine, Ser: 0.74 mg/dL (ref 0.57–1.00)
GFR calc Af Amer: 112 mL/min/{1.73_m2} (ref >59)
GFR calc non Af Amer: 97 mL/min/{1.73_m2} (ref >59)
Globulin, Total: 2.8 g/dL (ref 1.5–4.5)
Glucose: 91 mg/dL (ref 65–99)
Potassium: 3.7 mmol/L (ref 3.5–5.2)
Sodium: 138 mmol/L (ref 134–144)
Total Protein: 7 g/dL (ref 6.0–8.5)

## 2019-08-27 LAB — CBC WITH DIFFERENTIAL/PLATELET
Basophils Absolute: 0 10*3/uL (ref 0.0–0.2)
Basos: 0 %
EOS (ABSOLUTE): 0.3 10*3/uL (ref 0.0–0.4)
Eos: 3 %
Hematocrit: 41.5 % (ref 34.0–46.6)
Hemoglobin: 13.6 g/dL (ref 11.1–15.9)
Immature Grans (Abs): 0 10*3/uL (ref 0.0–0.1)
Immature Granulocytes: 0 %
Lymphocytes Absolute: 1.1 10*3/uL (ref 0.7–3.1)
Lymphs: 15 %
MCH: 29.8 pg (ref 26.6–33.0)
MCHC: 32.8 g/dL (ref 31.5–35.7)
MCV: 91 fL (ref 79–97)
Monocytes Absolute: 0.6 10*3/uL (ref 0.1–0.9)
Monocytes: 8 %
Neutrophils Absolute: 5.7 10*3/uL (ref 1.4–7.0)
Neutrophils: 74 %
Platelets: 316 10*3/uL (ref 150–450)
RBC: 4.56 x10E6/uL (ref 3.77–5.28)
RDW: 12.9 % (ref 11.7–15.4)
WBC: 7.8 10*3/uL (ref 3.4–10.8)

## 2019-08-27 LAB — LIPID PANEL W/O CHOL/HDL RATIO
Cholesterol, Total: 161 mg/dL (ref 100–199)
HDL: 41 mg/dL (ref >39)
LDL Chol Calc (NIH): 66 mg/dL (ref 0–99)
Triglycerides: 341 mg/dL — ABNORMAL HIGH (ref 0–149)
VLDL Cholesterol Cal: 54 mg/dL — ABNORMAL HIGH (ref 5–40)

## 2019-08-27 LAB — VITAMIN D 25 HYDROXY (VIT D DEFICIENCY, FRACTURES): Vit D, 25-Hydroxy: 28.8 ng/mL — ABNORMAL LOW (ref 30.0–100.0)

## 2019-08-27 LAB — TSH: TSH: 2.78 u[IU]/mL (ref 0.450–4.500)

## 2019-08-27 LAB — HIV ANTIBODY (ROUTINE TESTING W REFLEX): HIV Screen 4th Generation wRfx: NONREACTIVE

## 2019-09-02 LAB — CYTOLOGY - PAP
Diagnosis: NEGATIVE
High risk HPV: NEGATIVE

## 2019-10-26 ENCOUNTER — Other Ambulatory Visit: Payer: Self-pay | Admitting: Family Medicine

## 2020-01-18 IMAGING — MG MM DIGITAL SCREENING BILAT W/ TOMO W/ CAD
8 series · 8 of 24 positions shown · non-contrast
Comparison: Previous exam(s).

CLINICAL DATA: Screening.

EXAM:
DIGITAL SCREENING BILATERAL MAMMOGRAM WITH TOMO AND CAD

[L CC synth-2D]
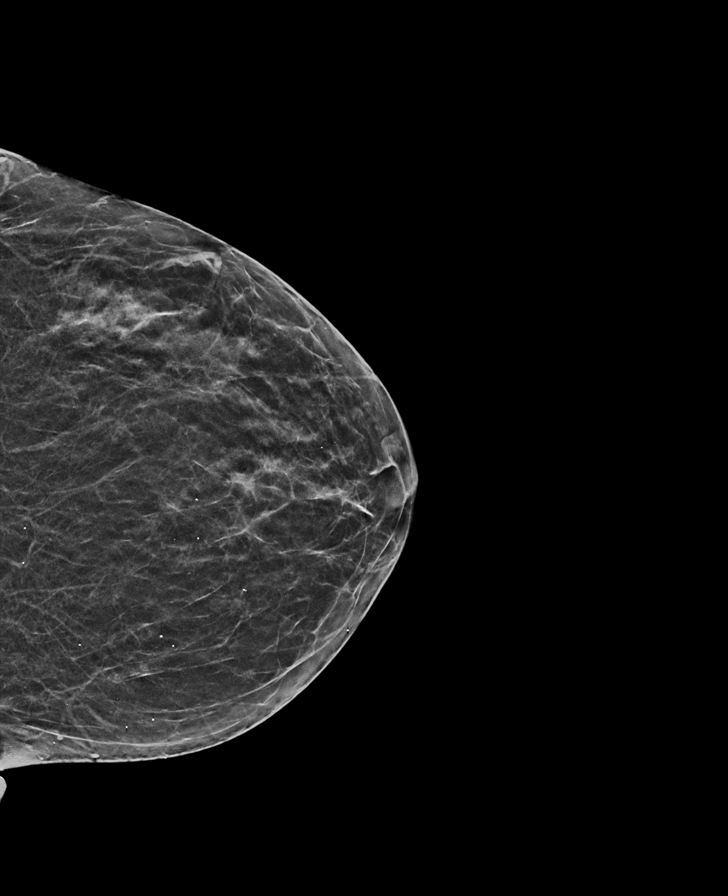

[L MLO synth-2D]
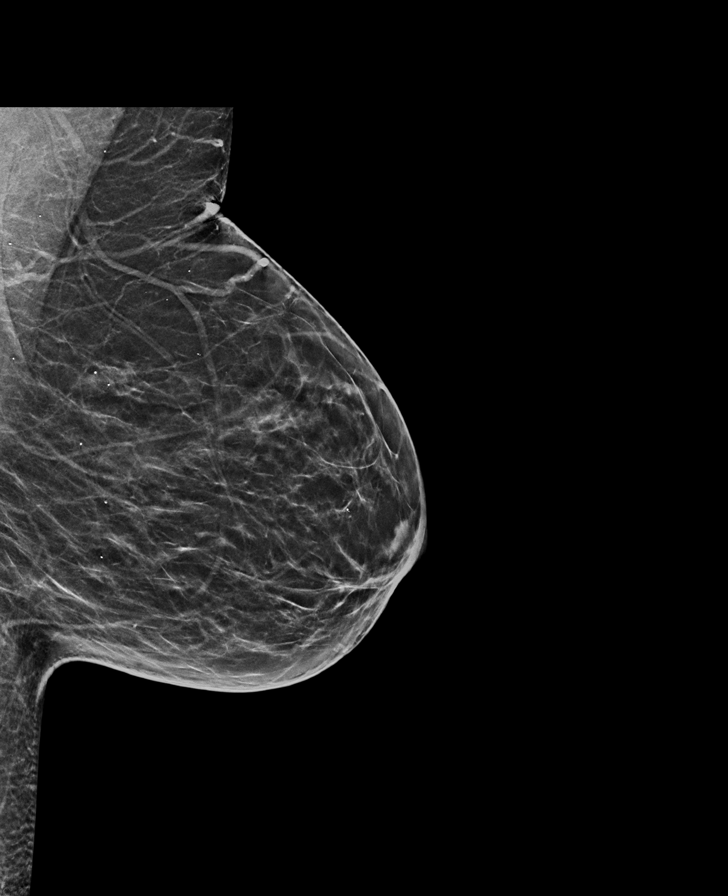

[R MLO synth-2D]
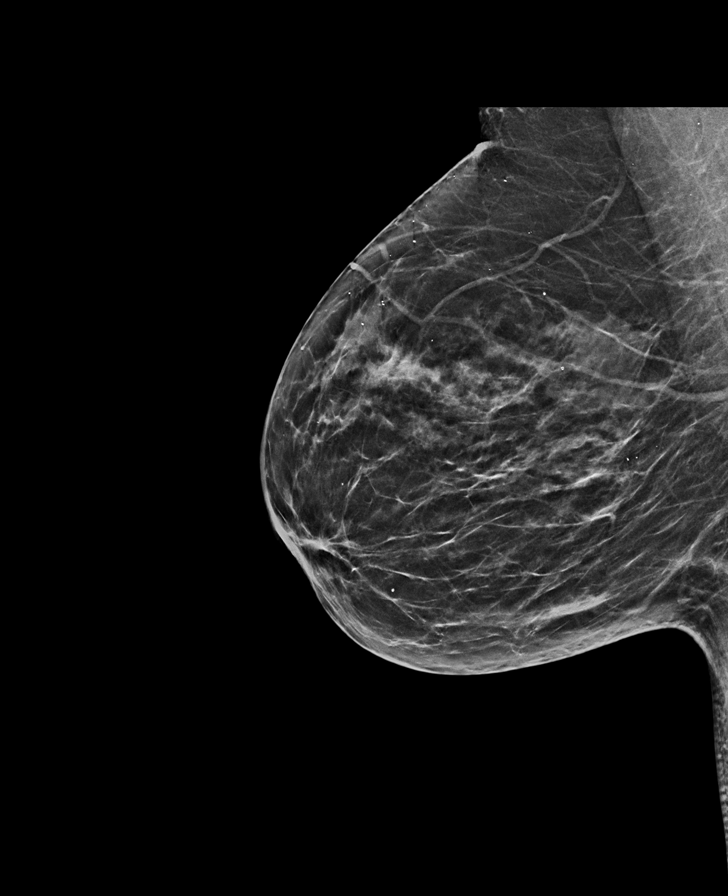

[R CC synth-2D]
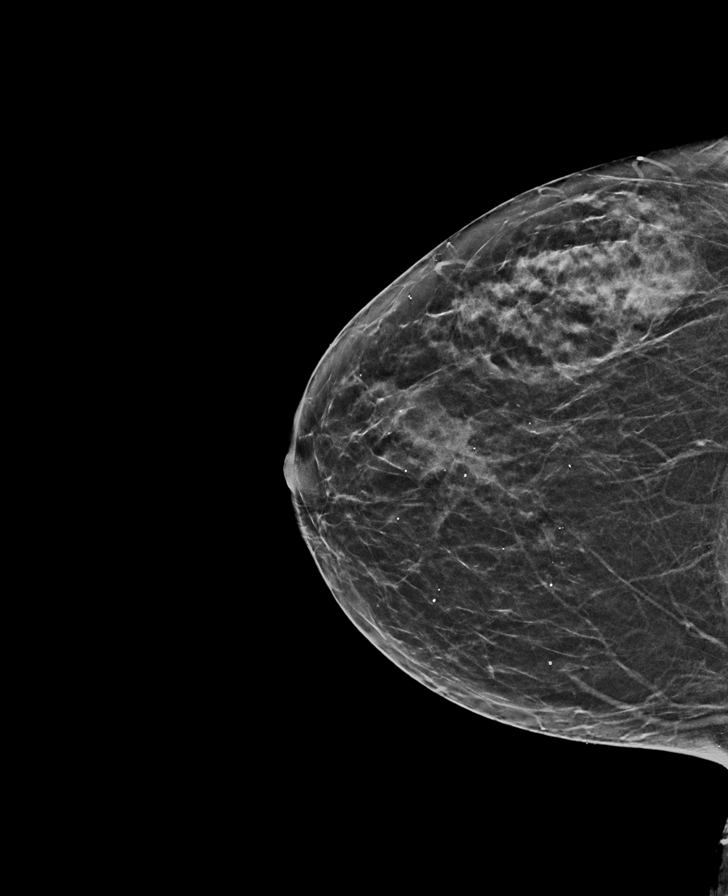

[L CC tomo · tomo slice 23/46.0]
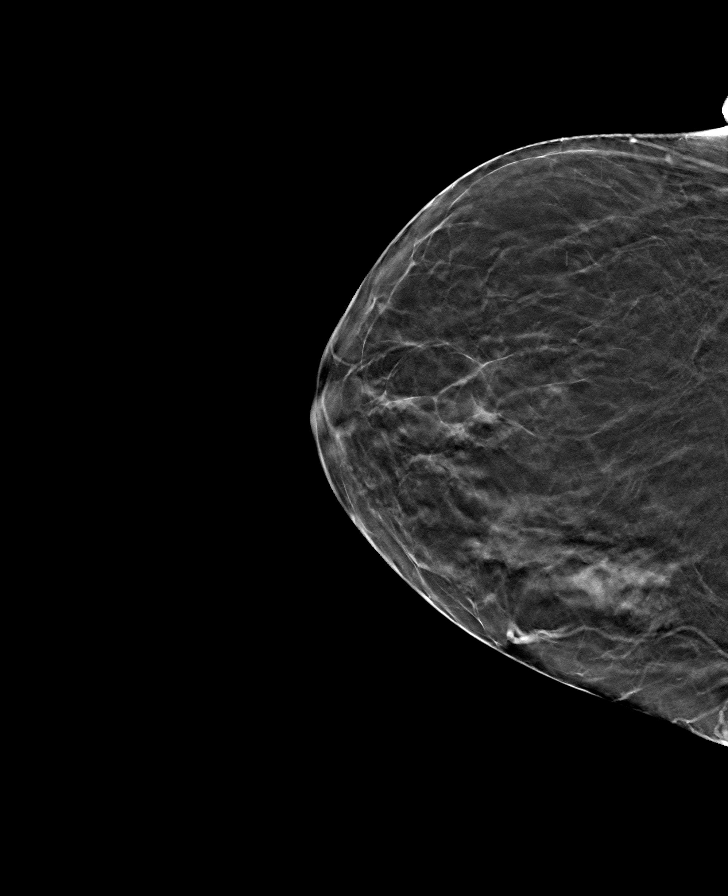

[R MLO tomo · tomo slice 29/56.0]
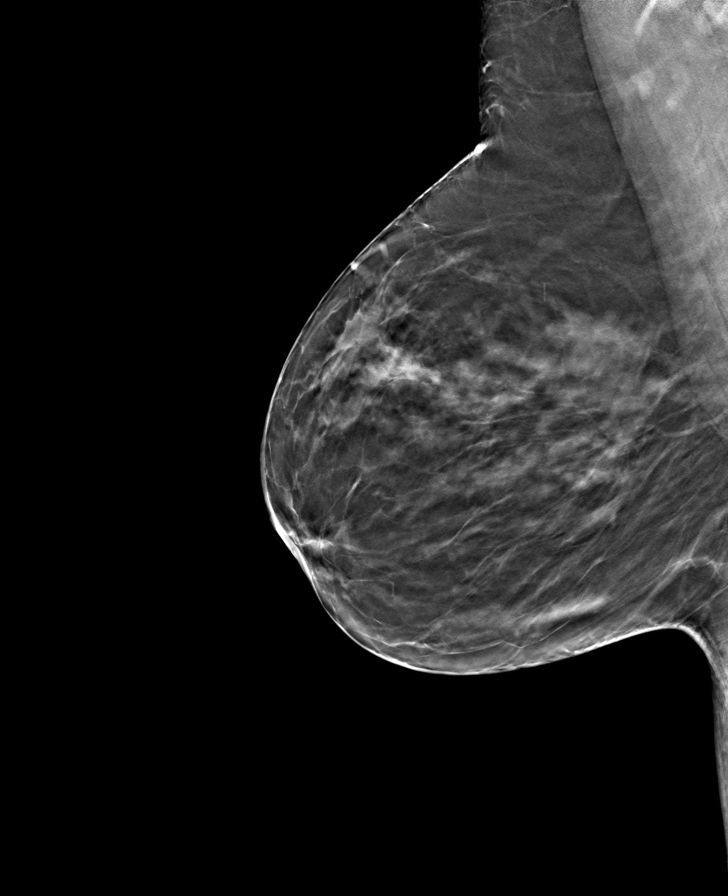

[L MLO tomo · tomo slice 28/55.0]
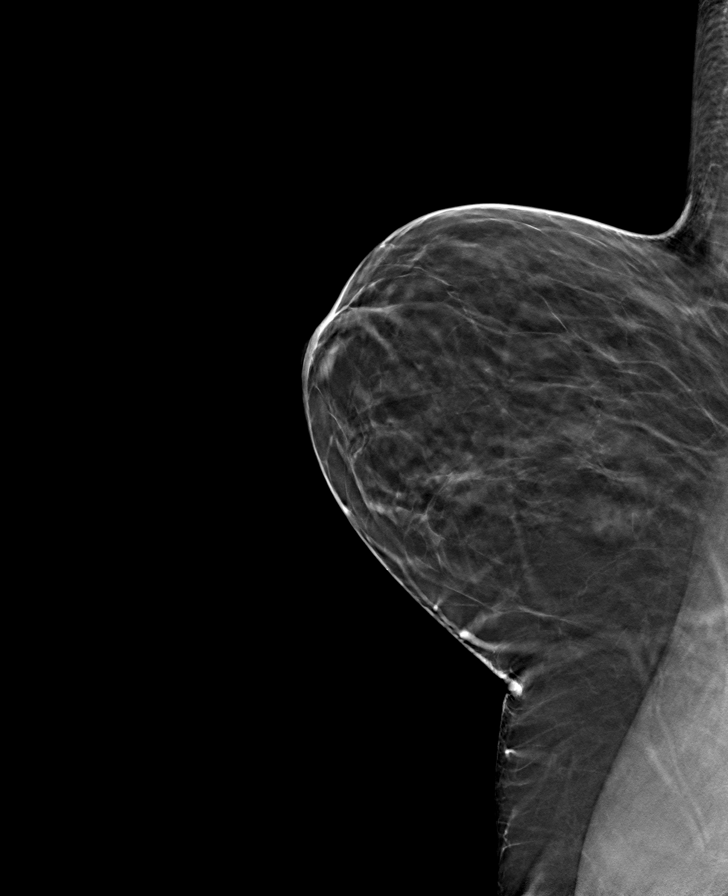

[R CC tomo · tomo slice 26/51.0]
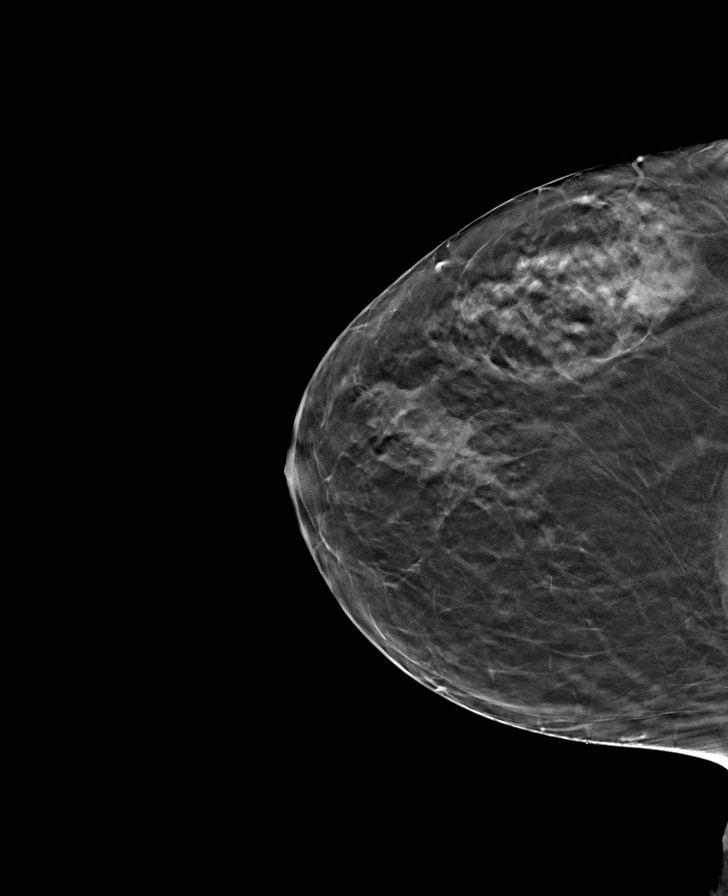

[8 of 24 positions shown; findings below may reference images not displayed]

ACR Breast Density Category b: There are scattered areas of
fibroglandular density.
FINDINGS: There are no findings suspicious for malignancy. Images were
processed with CAD.
IMPRESSION: No mammographic evidence of malignancy. A result letter of this
screening mammogram will be mailed directly to the patient.

RECOMMENDATION:
Screening mammogram in one year. (Code:[id number])

BI-RADS CATEGORY  1: Negative.

## 2020-01-20 ENCOUNTER — Other Ambulatory Visit: Payer: Self-pay | Admitting: Family Medicine

## 2020-01-21 NOTE — Telephone Encounter (Signed)
Requested medication (s) are due for refill today:   Yes  Requested medication (s) are on the active medication list:   Yes  Future visit scheduled:   Yes   Last ordered: 10/27/2019  #90  0 refills  Clinic note:   Returned because a pop up appears about which types of this medication her insurance will pay for.   Requested Prescriptions  Pending Prescriptions Disp Refills   SYNTHROID 50 MCG tablet [Pharmacy Med Name: SYNTHROID 0.05MG  (50MCG)TABLETS] 90 tablet 0    Sig: TAKE 1 TABLET(50 MCG) BY MOUTH DAILY BEFORE BREAKFAST      Endocrinology:  Hypothyroid Agents Failed - 01/20/2020  7:10 PM      Failed - TSH needs to be rechecked within 3 months after an abnormal result. Refill until TSH is due.      Passed - TSH in normal range and within 360 days    TSH  Date Value Ref Range Status  08/26/2019 2.780 0.450 - 4.500 uIU/mL Final          Passed - Valid encounter within last 12 months    Recent Outpatient Visits           4 months ago Routine general medical examination at a health care facility   [facility], [place], DO   7 months ago Dermatitis   [facility] [place], Henrine Screws T, NP   9 months ago Acquired hypothyroidism   [facility] [place], [place], DO   1 year ago Acquired hypothyroidism   [place], [place], DO   1 year ago Acquired hypothyroidism   [facility] Valerie Roys, DO       Future Appointments             In 1 month Johnson, Barb Merino, DO [employer]

## 2020-01-21 NOTE — Telephone Encounter (Signed)
LOV: 16/6/20, NOV: 02/27/20 with Park Liter, DO.

## 2020-02-27 ENCOUNTER — Encounter: Payer: Self-pay | Admitting: Family Medicine

## 2020-02-27 ENCOUNTER — Ambulatory Visit (INDEPENDENT_AMBULATORY_CARE_PROVIDER_SITE_OTHER): Payer: BC Managed Care – PPO | Admitting: Family Medicine

## 2020-02-27 DIAGNOSIS — L719 Rosacea, unspecified: Secondary | ICD-10-CM

## 2020-02-27 DIAGNOSIS — E039 Hypothyroidism, unspecified: Secondary | ICD-10-CM | POA: Diagnosis not present

## 2020-02-27 DIAGNOSIS — E559 Vitamin D deficiency, unspecified: Secondary | ICD-10-CM | POA: Diagnosis not present

## 2020-02-27 DIAGNOSIS — F410 Panic disorder [episodic paroxysmal anxiety] without agoraphobia: Secondary | ICD-10-CM | POA: Diagnosis not present

## 2020-02-27 MED ORDER — ALPRAZOLAM 0.25 MG PO TABS: ORAL_TABLET | ORAL | 0 refills | Status: DC

## 2020-02-27 NOTE — Progress Notes (Signed)
There were no vitals taken for this visit.   Subjective:    Patient ID: Sherry Gonzalez, female    DOB: [birth year], 49 y.o.   MRN: [id number]  HPI: Sherry Gonzalez is a 49 y.o. female  Chief Complaint  Patient presents with  . Anxiety   ANXIETY/STRESS Duration:stable Anxious mood: no  Excessive worrying: yes Irritability: no  Sweating: no Nausea: no Palpitations:no Hyperventilation: no Panic attacks: no Agoraphobia: no  Obscessions/compulsions: no Depressed mood: no Depression screen [facility] 2/9 08/26/2019 04/11/2019 11/18/2018 06/14/2018 04/05/2018  Decreased Interest 0 0 0 0 0  Down, Depressed, Hopeless 0 0 1 0 0  PHQ - 2 Score 0 0 1 0 0  Altered sleeping 0 1 0 0 0  Tired, decreased energy 0 0 0 1 0  Change in appetite 0 0 1 0 2  Feeling bad or failure about yourself  0 0 0 0 1  Trouble concentrating 0 0 0 0 0  Moving slowly or fidgety/restless 0 0 0 0 0  Suicidal thoughts 0 0 0 0 0  PHQ-9 Score 0 1 2 1 3   Difficult doing work/chores Not difficult at all Not difficult at all Not difficult at all Not difficult at all Not difficult at all   GAD 7 : Generalized Anxiety Score 02/27/2020 08/26/2019 04/11/2019 11/18/2018  Nervous, Anxious, on Edge 0 0 0 0  Control/stop worrying 0 0 0 0  Worry too much - different things 0 0 0 0  Trouble relaxing 0 0 0 0  Restless 0 0 0 0  Easily annoyed or irritable 0 0 0 0  Afraid - awful might happen 0 0 0 0  Total GAD 7 Score 0 0 0 0  Anxiety Difficulty Not difficult at all Not difficult at all Not difficult at all Not difficult at all   Anhedonia: no Weight changes: no Insomnia: no   Hypersomnia: no Fatigue/loss of energy: no Feelings of worthlessness: no Feelings of guilt: no Impaired concentration/indecisiveness: no Suicidal ideations: no  Crying spells: no Recent Stressors/Life Changes: yes- just sold her house   Relationship problems: no   Family stress: no     Financial stress: no    Job stress: no    Recent death/loss:  no  HYPOTHYROIDISM Thyroid control status:controlled Satisfied with current treatment? yes Medication side effects: no Medication compliance: excellent compliance Etiology of hypothyroidism:  Recent dose adjustment:no Fatigue: yes Cold intolerance: no Heat intolerance: no Weight gain: no Weight loss: no Constipation: no Diarrhea/loose stools: no Palpitations: no Lower extremity edema: no Anxiety/depressed mood: yes   Relevant past medical, surgical, family and social history reviewed and updated as indicated. Interim medical history since our last visit reviewed. Allergies and medications reviewed and updated.  Review of Systems  Constitutional: Positive for fatigue. Negative for activity change, appetite change, chills, diaphoresis, fever and unexpected weight change.  Respiratory: Negative.   Cardiovascular: Negative.   Gastrointestinal: Negative.   Skin: Negative.   Psychiatric/Behavioral: Negative for agitation, behavioral problems, confusion, decreased concentration, dysphoric mood, hallucinations, self-injury, sleep disturbance and suicidal ideas. The patient is nervous/anxious. The patient is not hyperactive.     Per HPI unless specifically indicated above     Objective:    There were no vitals taken for this visit.  Wt Readings from Last 3 Encounters:  08/26/19 181 lb (82.1 kg)  11/18/18 178 lb 11.2 oz (81.1 kg)  06/14/18 157 lb 6.4 oz (71.4 kg)    Physical Exam Vitals and nursing note reviewed.  Constitutional:      General: She is not in acute distress.    Appearance: Normal appearance. She is not ill-appearing, toxic-appearing or diaphoretic.  HENT:     Head: Normocephalic and atraumatic.     Right Ear: External ear normal.     Left Ear: External ear normal.     Nose: Nose normal.     Mouth/Throat:     Mouth: Mucous membranes are moist.     Pharynx: Oropharynx is clear.  Eyes:     General: No scleral icterus.       Right eye: No discharge.         Left eye: No discharge.     Conjunctiva/sclera: Conjunctivae normal.     Pupils: Pupils are equal, round, and reactive to light.  Pulmonary:     Effort: Pulmonary effort is normal. No respiratory distress.     Comments: Speaking in full sentences Musculoskeletal:        General: Normal range of motion.     Cervical back: Normal range of motion.  Skin:    Coloration: Skin is not jaundiced or pale.     Findings: No bruising, erythema, lesion or rash.  Neurological:     Mental Status: She is alert and oriented to person, place, and time. Mental status is at baseline.  Psychiatric:        Mood and Affect: Mood normal.        Behavior: Behavior normal.        Thought Content: Thought content normal.        Judgment: Judgment normal.     Results for orders placed or performed in visit on 08/26/19  Microscopic Examination   URINE  Result Value Ref Range   WBC, UA 0-5 0 - 5 /hpf   RBC 0-2 0 - 2 /hpf   Epithelial Cells (non renal) 0-10 0 - 10 /hpf   Bacteria, UA Few (A) None seen/Few  HIV Antibody (routine testing w rflx)  Result Value Ref Range   HIV Screen 4th Generation wRfx Non Reactive Non Reactive  CBC with Differential/Platelet  Result Value Ref Range   WBC 7.8 3.4 - 10.8 x10E3/uL   RBC 4.56 3.77 - 5.28 x10E6/uL   Hemoglobin 13.6 11.1 - 15.9 g/dL   Hematocrit 41.5 34.0 - 46.6 %   MCV 91 79 - 97 fL   MCH 29.8 26.6 - 33.0 pg   MCHC 32.8 31.5 - 35.7 g/dL   RDW 12.9 11.7 - 15.4 %   Platelets 316 150 - 450 x10E3/uL   Neutrophils 74 Not Estab. %   Lymphs 15 Not Estab. %   Monocytes 8 Not Estab. %   Eos 3 Not Estab. %   Basos 0 Not Estab. %   Neutrophils Absolute 5.7 1.4 - 7.0 x10E3/uL   Lymphocytes Absolute 1.1 0.7 - 3.1 x10E3/uL   Monocytes Absolute 0.6 0.1 - 0.9 x10E3/uL   EOS (ABSOLUTE) 0.3 0.0 - 0.4 x10E3/uL   Basophils Absolute 0.0 0.0 - 0.2 x10E3/uL   Immature Granulocytes 0 Not Estab. %   Immature Grans (Abs) 0.0 0.0 - 0.1 x10E3/uL  Comprehensive metabolic panel   Result Value Ref Range   Glucose 91 65 - 99 mg/dL   BUN 9 6 - 24 mg/dL   Creatinine, Ser 0.74 0.57 - 1.00 mg/dL   GFR calc non Af Amer 97 >59 mL/min/1.73   GFR calc Af Amer 112 >59 mL/min/1.73   BUN/Creatinine Ratio 12 9 - 23  Sodium 138 134 - 144 mmol/L   Potassium 3.7 3.5 - 5.2 mmol/L   Chloride 99 96 - 106 mmol/L   CO2 25 20 - 29 mmol/L   Calcium 9.1 8.7 - 10.2 mg/dL   Total Protein 7.0 6.0 - 8.5 g/dL   Albumin 4.2 3.8 - 4.8 g/dL   Globulin, Total 2.8 1.5 - 4.5 g/dL   Albumin/Globulin Ratio 1.5 1.2 - 2.2   Bilirubin Total 0.7 0.0 - 1.2 mg/dL   Alkaline Phosphatase 78 39 - 117 IU/L   AST 17 0 - 40 IU/L   ALT 11 0 - 32 IU/L  Lipid Panel w/o Chol/HDL Ratio  Result Value Ref Range   Cholesterol, Total 161 100 - 199 mg/dL   Triglycerides 341 (H) 0 - 149 mg/dL   HDL 41 >39 mg/dL   VLDL Cholesterol Cal 54 (H) 5 - 40 mg/dL   LDL Chol Calc (NIH) 66 0 - 99 mg/dL  TSH  Result Value Ref Range   TSH 2.780 0.450 - 4.500 uIU/mL  UA/M w/rflx Culture, Routine   Specimen: Urine   URINE  Result Value Ref Range   Specific Gravity, UA 1.010 1.005 - 1.030   pH, UA 7.0 5.0 - 7.5   Color, UA Yellow Yellow   Appearance Ur Clear Clear   Leukocytes,UA Negative Negative   Protein,UA Negative Negative/Trace   Glucose, UA Negative Negative   Ketones, UA Negative Negative   RBC, UA Trace (A) Negative   Bilirubin, UA Negative Negative   Urobilinogen, Ur 0.2 0.2 - 1.0 mg/dL   Nitrite, UA Negative Negative   Microscopic Examination See below:   VITAMIN D 25 Hydroxy (Vit-D Deficiency, Fractures)  Result Value Ref Range   Vit D, 25-Hydroxy 28.8 (L) 30.0 - 100.0 ng/mL  Cytology - PAP  Result Value Ref Range   High risk HPV Negative    Adequacy      Satisfactory for evaluation; transformation zone component PRESENT.   Diagnosis      - Negative for intraepithelial lesion or malignancy (NILM)      Assessment & Plan:   Problem List Items Addressed This Visit      Endocrine   Hypothyroid     Will recheck levels. Await results. Treat as needed. Call with any concerns.       Relevant Orders   TSH     Musculoskeletal and Integument   Rosacea    Has been getting a little worse. Would like to see dermatology. Referral generated today.      Relevant Orders   Ambulatory referral to Dermatology     Other   Anxiety disorder - Primary    Under good control on current regimen. Continue current regimen. Continue to monitor. Call with any concerns. Refills given. Using xanax very occasionally 30 pills lasts her 6 months. Follow up 6 months.        Relevant Medications   ALPRAZolam (XANAX) 0.25 MG tablet   Vitamin D deficiency    Rechecking levels shortly. Call with any concerns. Await results.       Relevant Orders   VITAMIN D 25 Hydroxy (Vit-D Deficiency, Fractures)       Follow up plan: Return in about 6 months (around 08/28/2020) for Physical.    . This visit was completed via MyChart due to the restrictions of the COVID-19 pandemic. All issues as above were discussed and addressed. Physical exam was done as above through visual confirmation on MyChart. If it was felt that  the patient should be evaluated in the office, they were directed there. The patient verbally consented to this visit. . Location of the patient: home . Location of the provider: work . Those involved with this call:  . Provider: Park Liter, DO . CMA: Tiffany Reel, CMA . Front Desk/Registration: Don Perking  . Time spent on call: 25 minutes with patient face to face via video conference. More than 50% of this time was spent in counseling and coordination of care. 40 minutes total spent in review of patient's record and preparation of their chart.

## 2020-02-27 NOTE — Assessment & Plan Note (Signed)
Rechecking levels shortly. Call with any concerns. Await results.

## 2020-02-27 NOTE — Assessment & Plan Note (Signed)
Will recheck levels. Await results. Treat as needed. Call with any concerns.

## 2020-02-27 NOTE — Assessment & Plan Note (Signed)
Has been getting a little worse. Would like to see dermatology. Referral generated today.

## 2020-02-27 NOTE — Assessment & Plan Note (Signed)
Under good control on current regimen. Continue current regimen. Continue to monitor. Call with any concerns. Refills given. Using xanax very occasionally 30 pills lasts her 6 months. Follow up 6 months.

## 2020-02-27 NOTE — Patient Instructions (Signed)
We are recommending the vaccine to everyone who has not had an allergic reaction to any of the components of the vaccine. If you have specific questions about the vaccine, please bring them up with your health care provider to discuss them.   We will likely not be getting the vaccine in the office for the first rounds of vaccinations. The way they are releasing the vaccines is going to be through the health systems (like [place], [place], [facility], [facility]), through your county health department, or through the pharmacies.   The [facility] Department is giving vaccines to those 65+ and Health Care Workers Teachers and [place] providers start 01/14/20, Essential workers start 3/10 and those with co-morbidities start 02/11/20 Call [phone] to schedule  If you are 65+ you can get a vaccine through [facility] by signing up for an appointment.  You can sign up by going to: [email or web address].  You can get more information by going to: [email or web address]

## 2020-03-08 ENCOUNTER — Other Ambulatory Visit: Payer: BC Managed Care – PPO

## 2020-03-08 ENCOUNTER — Other Ambulatory Visit: Payer: Self-pay

## 2020-03-08 DIAGNOSIS — E559 Vitamin D deficiency, unspecified: Secondary | ICD-10-CM | POA: Diagnosis not present

## 2020-03-08 DIAGNOSIS — E039 Hypothyroidism, unspecified: Secondary | ICD-10-CM | POA: Diagnosis not present

## 2020-03-09 ENCOUNTER — Other Ambulatory Visit: Payer: Self-pay | Admitting: Family Medicine

## 2020-03-09 LAB — TSH: TSH: 2.16 u[IU]/mL (ref 0.450–4.500)

## 2020-03-09 LAB — VITAMIN D 25 HYDROXY (VIT D DEFICIENCY, FRACTURES): Vit D, 25-Hydroxy: 24.5 ng/mL — ABNORMAL LOW (ref 30.0–100.0)

## 2020-03-09 MED ORDER — VITAMIN D (ERGOCALCIFEROL) 1.25 MG (50000 UNIT) PO CAPS: 50000.0000 [IU] | ORAL_CAPSULE | ORAL | 0 refills | Status: DC

## 2020-04-16 ENCOUNTER — Other Ambulatory Visit: Payer: Self-pay | Admitting: Family Medicine

## 2020-04-16 NOTE — Telephone Encounter (Signed)
Requested Prescriptions  Pending Prescriptions Disp Refills  . mirtazapine (REMERON) 15 MG tablet [Pharmacy Med Name: MIRTAZAPINE 15MG  TABLETS] 90 tablet 1    Sig: TAKE 1 TABLET(15 MG) BY MOUTH AT BEDTIME     Psychiatry: Antidepressants - mirtazapine Failed - 04/16/2020 10:19 AM      Failed - Triglycerides in normal range and within 360 days    Triglycerides  Date Value Ref Range Status  08/26/2019 341 (H) 0 - 149 mg/dL Final         Passed - AST in normal range and within 360 days    AST  Date Value Ref Range Status  08/26/2019 17 0 - 40 IU/L Final         Passed - ALT in normal range and within 360 days    ALT  Date Value Ref Range Status  08/26/2019 11 0 - 32 IU/L Final         Passed - Total Cholesterol in normal range and within 360 days    Cholesterol, Total  Date Value Ref Range Status  08/26/2019 161 100 - 199 mg/dL Final         Passed - WBC in normal range and within 360 days    WBC  Date Value Ref Range Status  08/26/2019 7.8 3.4 - 10.8 x10E3/uL Final         Passed - Valid encounter within last 6 months    Recent Outpatient Visits          1 month ago Panic disorder without agoraphobia   [facility] [place], Megan P, DO   7 months ago Routine general medical examination at a health care facility   [place], Daggett, DO   10 months ago Dermatitis   [place], Henrine Screws T, NP   1 year ago Acquired hypothyroidism   [facility] [place], [place]er, DO   1 year ago Acquired hypothyroidism   [place], Alva, DO      Future Appointments            In 1 month Brendolyn Patty, MD [place]   In 4 months Wynetta Emery, Barb Merino, DO [facility]

## 2020-05-18 ENCOUNTER — Other Ambulatory Visit: Payer: Self-pay

## 2020-05-18 ENCOUNTER — Ambulatory Visit: Payer: BC Managed Care – PPO | Admitting: Dermatology

## 2020-05-18 DIAGNOSIS — L719 Rosacea, unspecified: Secondary | ICD-10-CM

## 2020-05-18 DIAGNOSIS — L219 Seborrheic dermatitis, unspecified: Secondary | ICD-10-CM | POA: Diagnosis not present

## 2020-05-18 MED ORDER — SOOLANTRA 1 % EX CREA: TOPICAL_CREAM | CUTANEOUS | 2 refills | Status: DC

## 2020-05-18 MED ORDER — CICLOPIROX OLAMINE 0.77 % EX SUSP: CUTANEOUS | 2 refills | Status: DC

## 2020-05-18 MED ORDER — DOXYCYCLINE 40 MG PO CPDR: DELAYED_RELEASE_CAPSULE | ORAL | 2 refills | Status: DC

## 2020-05-18 MED ORDER — CICLOPIROX 1 % EX SHAM: MEDICATED_SHAMPOO | CUTANEOUS | 2 refills | Status: DC

## 2020-05-18 NOTE — Progress Notes (Signed)
   New Patient Visit  Subjective  Sherry Gonzalez is a 49 y.o. female who presents for the following: Rosacea.  Patient here today to be evaluated for Rosacea. Patient advises that it started about 2 years ago she noticed the redness on her face, she does not get pimples. Was using metronidazole gel for about 2 months but didn't notice any improvement. She also noticed about the same time that her scalp gets itchy. Patient does have sensitive skin. She does not use anything other than water to wash her face.  The following portions of the chart were reviewed this encounter and updated as appropriate:      Review of Systems:  No other skin or systemic complaints except as noted in HPI or Assessment and Plan.  Objective  Well appearing patient in no apparent distress; mood and affect are within normal limits.  A focused examination was performed including face, scalp. Relevant physical exam findings are noted in the Assessment and Plan.  Objective  Head - Anterior (Face): Erythema at cheeks and chin with telangiectasias.   Objective  Scalp: Mild erythema scalp, no scale today, pt states scalp itches   Assessment & Plan  Rosacea Head - Anterior (Face)  Erythematotelangiectatic type, discussed chronic condition and this type more difficult to treat  Discussed Rhofade gel vs Soolantra cream vs Finacea foam vs BBL. Patient advised BBL not covered by insurance. $350/tx, multiple treatments necessary for best result  Start Oracea 40mg  1PO QD with food #30 2RF Start Soolantra cream QHS to face #30 2RF Recommend gentle skin care and sunscreen spf 30 or greater daily, CeraVe samples given.  Ordered Medications: Ivermectin (SOOLANTRA) 1 % CREA doxycycline (ORACEA) 40 MG capsule  Seborrheic dermatitis Scalp  Start ciclopirox shampoo 2-3 times weekly up to daily as needed. Massage into scalp and let sit a few minutes before rinsing. Start mometasone solution to spot treat AA's 1-2 times  daily as needed.   Ciclopirox 1 % shampoo - Scalp  Return in about 3 months (around 08/18/2020) for rosacea.  Graciella Belton, RMA, am acting as scribe for Brendolyn Patty, MD .  Documentation: I have reviewed the above documentation for accuracy and completeness, and I agree with the above.  Brendolyn Patty MD

## 2020-05-18 NOTE — Patient Instructions (Addendum)
Recommend daily broad spectrum sunscreen SPF 30+ to sun-exposed areas, reapply every 2 hours as needed. Call for new or changing lesions.  Rosacea  What is rosacea? Rosacea (say: ro-zay-sha) is a common skin disease that usually begins as a trend of flushing or blushing easily.  As rosacea progresses, a persistent redness in the center of the face will develop and may gradually spread beyond the nose and cheeks to the forehead and chin.  In some cases, the ears, chest, and back could be affected.  Rosacea may appear as tiny blood vessels or small red bumps that occur in crops.  Frequently they can contain pus, and are called "pustules".  If the bumps do not contain pus, they are referred to as "papules".  Rarely, in prolonged, untreated cases of rosacea, the oil glands of the nose and cheeks may become permanently enlarged.  This is called rhinophyma, and is seen more frequently in men.  Signs and Risks In its beginning stages, rosacea tends to come and go, which makes it difficult to recognize.  It can start as intermittent flushing of the face.  Eventually, blood vessels may become permanently visible.  Pustules and papules can appear, but can be mistaken for adult acne.  People of all races, ages, genders and ethnic groups are at risk of developing rosacea.  However, it is more common in women (especially around menopause) and adults with fair skin between the ages of 3 and 10.  Treatment Dermatologists typically recommend a combination of treatments to effectively manage rosacea.  Treatment can improve symptoms and may stop the progression of the rosacea.  Treatment may involve both topical and oral medications.  The tetracycline antibiotics are often used for their anti-inflammatory effect; however, because of the possibility of developing antibiotic resistance, they should not be used long term at full dose.  For dilated blood vessels the options include electrodessication (uses electric current  through a small needle), laser treatment, and cosmetics to hide the redness.   With all forms of treatment, improvement is a slow process, and patients may not see any results for the first 3-4 weeks.  It is very important to avoid the sun and other triggers.  Patients must wear sunscreen daily.  Skin Care Instructions: 1. Cleanse the skin with a mild soap such as CeraVe cleanser, Cetaphil cleanser, or Dove soap once or twice daily as needed. 2. Moisturize with Eucerin Redness Relief Daily Perfecting Lotion (has a subtle green tint), CeraVe Moisturizing Cream, or Oil of Olay Daily Moisturizer with sunscreen every morning and/or night as recommended. 3. Makeup should be "non-comedogenic" (won't clog pores) and be labeled "for sensitive skin". Good choices for cosmetics are: Neutrogena, Almay, and Physician's Formula.  Any product with a green tint tends to offset a red complexion. 4. If your eyes are dry and irritated, use artificial tears 2-3 times per day and cleanse the eyelids daily with baby shampoo.  Have your eyes examined at least every 2 years.  Be sure to tell your eye doctor that you have rosacea. 5. Alcoholic beverages tend to cause flushing of the skin, and may make rosacea worse. 6. Always wear sunscreen, protect your skin from extreme hot and cold temperatures, and avoid spicy foods, hot drinks, and mechanical irritation such as rubbing, scrubbing, or massaging the face.  Avoid harsh skin cleansers, cleansing masks, astringents, and exfoliation. If a particular product burns or makes your face feel tight, then it is likely to flare your rosacea. 7. If you are  having difficulty finding a sunscreen that you can tolerate, you may try switching to a chemical-free sunscreen.  These are ones whose active ingredient is zinc oxide or titanium dioxide only.  They should also be fragrance free, non-comedogenic, and labeled for sensitive skin. 8. Rosacea triggers may vary from person to person.  There  are a variety of foods that have been reported to trigger rosacea.  Some patients find that keeping a diary of what they were doing when they flared helps them avoid triggers.

## 2020-05-19 ENCOUNTER — Other Ambulatory Visit: Payer: Self-pay

## 2020-05-19 DIAGNOSIS — L219 Seborrheic dermatitis, unspecified: Secondary | ICD-10-CM

## 2020-05-19 MED ORDER — MOMETASONE FUROATE 0.1 % EX SOLN: CUTANEOUS | 2 refills | Status: DC

## 2020-06-01 ENCOUNTER — Telehealth: Payer: Self-pay

## 2020-06-01 NOTE — Telephone Encounter (Signed)
Yes that is fine.  Another option is sending in the generic Doryx ER 80 mg to [facility], and it can be cut in half to take 40 mg PO qd.  It is $35 in uncovered, but if 30 are sent in as once daily, it will last her 2 months.

## 2020-06-01 NOTE — Telephone Encounter (Signed)
Doxycycline 40mg  not covered. Okay to switch to Doxycycline 20mg  BID?

## 2020-06-02 MED ORDER — DOXYCYCLINE HYCLATE 80 MG PO TBEC: 1.0000 | DELAYED_RELEASE_TABLET | Freq: Once | ORAL | 1 refills | Status: AC

## 2020-06-02 NOTE — Telephone Encounter (Signed)
Spoke with patient this morning and advised her of information per Dr. Nicole Kindred. She would like to do the Doxycycline 80mg , 1/2 tablet daily to [facility]. Also I have emailed patient a copy of the [employer] card to help bring down copay of Soolantra Cream.

## 2020-07-02 ENCOUNTER — Telehealth: Payer: Self-pay

## 2020-07-02 ENCOUNTER — Telehealth: Payer: Self-pay | Admitting: Family Medicine

## 2020-07-02 NOTE — Telephone Encounter (Signed)
Copied from [place] [phone]. Topic: General - Other >> Jul 02, 2020  8:12 AM Leward Quan A wrote: Reason for CRM: Patient called to inform Dr Wynetta Emery that she have finished her Vitamin D and want to know if it will be refilled or if she need blood work first Please call Ph# [phone]

## 2020-07-02 NOTE — Telephone Encounter (Signed)
Sent ot wrong practice Thx  Copied from [place] [phone]. Topic: General - Other >> Jul 02, 2020  8:12 AM Leward Quan A wrote: Reason for CRM: Patient called to inform Dr Wynetta Emery that she have finished her Vitamin D and want to know if it will be refilled or if she need blood work first Please call Ph# [phone]

## 2020-07-02 NOTE — Telephone Encounter (Signed)
Routing to provider to advise. Next steps not clear based on last result message.

## 2020-07-09 MED ORDER — VITAMIN D (ERGOCALCIFEROL) 1.25 MG (50000 UNIT) PO CAPS: 50000.0000 [IU] | ORAL_CAPSULE | ORAL | 0 refills | Status: DC

## 2020-07-09 NOTE — Addendum Note (Signed)
Addended by: Valerie Roys on: 07/09/2020 04:49 PM   Modules accepted: Orders

## 2020-07-15 ENCOUNTER — Telehealth: Payer: Self-pay | Admitting: Family Medicine

## 2020-07-15 NOTE — Telephone Encounter (Signed)
Copied from [place] [id number]. Topic: General - Inquiry >> Jul 15, 2020  2:27 PM Gillis Ends D wrote: Reason for CRM: Patient needs a prior auth on her synthroid. The pharmacy says that they aren't covering it anymore. She doesn't want to switch to the generic. She wants to remain on the brand.

## 2020-07-16 NOTE — Telephone Encounter (Signed)
Attempted PA for brand Synthroid. Came back saying that this is covered and no other PA activity needed.   Called the pharmacy. Synthroid is covered, copay is $103 for #90.   Called the patient and provided the information to her. She said that when she called the pharmacy yesterday that she was told it wasn't covered and would be over $200 so that's why she called [place]. Patient states she will call the pharmacy back and see what is going on.

## 2020-08-17 ENCOUNTER — Ambulatory Visit: Payer: BC Managed Care – PPO | Admitting: Dermatology

## 2020-08-17 ENCOUNTER — Other Ambulatory Visit: Payer: Self-pay

## 2020-08-17 DIAGNOSIS — D485 Neoplasm of uncertain behavior of skin: Secondary | ICD-10-CM

## 2020-08-17 DIAGNOSIS — L719 Rosacea, unspecified: Secondary | ICD-10-CM | POA: Diagnosis not present

## 2020-08-17 DIAGNOSIS — D2221 Melanocytic nevi of right ear and external auricular canal: Secondary | ICD-10-CM | POA: Diagnosis not present

## 2020-08-17 MED ORDER — DOXYCYCLINE HYCLATE 80 MG PO TBEC: 1.0000 | DELAYED_RELEASE_TABLET | Freq: Every day | ORAL | 2 refills | Status: DC

## 2020-08-17 MED ORDER — IVERMECTIN 1 % EX CREA: TOPICAL_CREAM | CUTANEOUS | 5 refills | Status: DC

## 2020-08-17 NOTE — Patient Instructions (Addendum)
Wound Care Instructions  1. Cleanse wound gently with soap and water once a day then pat dry with clean gauze. Apply a thing coat of Petrolatum (petroleum jelly, "Vaseline") over the wound (unless you have an allergy to this). We recommend that you use a new, sterile tube of Vaseline. Do not pick or remove scabs. Do not remove the yellow or white "healing tissue" from the base of the wound.  2. Cover the wound with fresh, clean, nonstick gauze and secure with paper tape. You may use Band-Aids in place of gauze and tape if the would is small enough, but would recommend trimming much of the tape off as there is often too much. Sometimes Band-Aids can irritate the skin.  3. You should call the office for your biopsy report after 1 week if you have not already been contacted.  4. If you experience any problems, such as abnormal amounts of bleeding, swelling, significant bruising, significant pain, or evidence of infection, please call the office immediately.    Rosacea  What is rosacea? Rosacea (say: ro-zay-sha) is a common skin disease that usually begins as a trend of flushing or blushing easily.  As rosacea progresses, a persistent redness in the center of the face will develop and may gradually spread beyond the nose and cheeks to the forehead and chin.  In some cases, the ears, chest, and back could be affected.  Rosacea may appear as tiny blood vessels or small red bumps that occur in crops.  Frequently they can contain pus, and are called "pustules".  If the bumps do not contain pus, they are referred to as "papules".  Rarely, in prolonged, untreated cases of rosacea, the oil glands of the nose and cheeks may become permanently enlarged.  This is called rhinophyma, and is seen more frequently in men.  Signs and Risks In its beginning stages, rosacea tends to come and go, which makes it difficult to recognize.  It can start as intermittent flushing of the face.  Eventually, blood vessels may  become permanently visible.  Pustules and papules can appear, but can be mistaken for adult acne.  People of all races, ages, genders and ethnic groups are at risk of developing rosacea.  However, it is more common in women (especially around menopause) and adults with fair skin between the ages of 35 and 2.  Treatment Dermatologists typically recommend a combination of treatments to effectively manage rosacea.  Treatment can improve symptoms and may stop the progression of the rosacea.  Treatment may involve both topical and oral medications.  The tetracycline antibiotics are often used for their anti-inflammatory effect; however, because of the possibility of developing antibiotic resistance, they should not be used long term at full dose.  For dilated blood vessels the options include electrodessication (uses electric current through a small needle), laser treatment, and cosmetics to hide the redness.   With all forms of treatment, improvement is a slow process, and patients may not see any results for the first 3-4 weeks.  It is very important to avoid the sun and other triggers.  Patients must wear sunscreen daily.  Skin Care Instructions: 1. Cleanse the skin with a mild soap such as CeraVe cleanser, Cetaphil cleanser, or Dove soap once or twice daily as needed. 2. Moisturize with Eucerin Redness Relief Daily Perfecting Lotion (has a subtle green tint), CeraVe Moisturizing Cream, or Oil of Olay Daily Moisturizer with sunscreen every morning and/or night as recommended. 3. Makeup should be "non-comedogenic" (won't clog pores)  and be labeled "for sensitive skin". Good choices for cosmetics are: Neutrogena, Almay, and Physician's Formula.  Any product with a green tint tends to offset a red complexion. 4. If your eyes are dry and irritated, use artificial tears 2-3 times per day and cleanse the eyelids daily with baby shampoo.  Have your eyes examined at least every 2 years.  Be sure to tell your eye  doctor that you have rosacea. 5. Alcoholic beverages tend to cause flushing of the skin, and may make rosacea worse. 6. Always wear sunscreen, protect your skin from extreme hot and cold temperatures, and avoid spicy foods, hot drinks, and mechanical irritation such as rubbing, scrubbing, or massaging the face.  Avoid harsh skin cleansers, cleansing masks, astringents, and exfoliation. If a particular product burns or makes your face feel tight, then it is likely to flare your rosacea. 7. If you are having difficulty finding a sunscreen that you can tolerate, you may try switching to a chemical-free sunscreen.  These are ones whose active ingredient is zinc oxide or titanium dioxide only.  They should also be fragrance free, non-comedogenic, and labeled for sensitive skin. 8. Rosacea triggers may vary from person to person.  There are a variety of foods that have been reported to trigger rosacea.  Some patients find that keeping a diary of what they were doing when they flared helps them avoid triggers.

## 2020-08-17 NOTE — Progress Notes (Signed)
   Follow-Up Visit   Subjective  Sherry Gonzalez is a 49 y.o. female who presents for the following: Rosacea (Patient is using Soolantra Cream at night and taking doxycycline 80mg  1/2 tablet QD. She has noticed some improvement, but is still having some redness on face and neck.) and Growth (Right post ear x years. Has grown recently and irritated by glasses and mask.).   The following portions of the chart were reviewed this encounter and updated as appropriate:      Review of Systems:  No other skin or systemic complaints except as noted in HPI or Assessment and Plan.  Objective  Well appearing patient in no apparent distress; mood and affect are within normal limits.  A focused examination was performed including face. Relevant physical exam findings are noted in the Assessment and Plan.  Objective  Face: Persistant erythema on bilateral cheeks and chin with telangiectasias.  Also erythema on BL upper neck.  Some improvement noted per patient.  Objective  Right Posterior Ear Crease: 6.0 mm flesh papule   Assessment & Plan  Rosacea Face  Some improvement with persistent redness.  Discussed BBL laser to treat the redness/telangiectasias. She would need multiple treatments to get desired results at $350 per treatment.  Pt will schedule with Sonia Baller.  Continue Soolantra Cream qhs face. Continue doxycycline 80mg  take 1/2 tablet po QD. Patient may try and d/c tand restart if rosacea flares.  Recommend daily broad spectrum sunscreen SPF 30+ to face, reapply every 2 hours as needed.   Discussed Rhofade Cream qam to help with redness. Patient defers today.   Reordered Medications Ivermectin (SOOLANTRA) 1 % CREA  Neoplasm of uncertain behavior of skin Right Posterior Ear Crease  Skin / nail biopsy Type of biopsy: tangential   Informed consent: discussed and consent obtained   Patient was prepped and draped in usual sterile fashion: Area prepped with alcohol. Anesthesia: the  lesion was anesthetized in a standard fashion   Anesthetic:  1% lidocaine w/ epinephrine 1-100,000 buffered w/ 8.4% NaHCO3 Instrument used: flexible razor blade   Hemostasis achieved with: pressure, aluminum chloride and electrodesiccation   Outcome: patient tolerated procedure well   Post-procedure details: wound care instructions given   Post-procedure details comment:  Ointment and small bandage applied  Specimen 1 - Surgical pathology Differential Diagnosis: Irritated Nevus vs other Check Margins: No 6.0 mm flesh papule  Return in 6 months (on 02/14/2021) for Rosacea f/u. Also BBL with Sonia Baller. Lindi Adie, CMA, am acting as scribe for Brendolyn Patty, MD .  Documentation: I have reviewed the above documentation for accuracy and completeness, and I agree with the above.  Brendolyn Patty MD

## 2020-08-23 ENCOUNTER — Telehealth: Payer: Self-pay

## 2020-08-23 NOTE — Telephone Encounter (Signed)
-----   Message from Brendolyn Patty, MD sent at 08/23/2020  9:50 AM EDT ----- Skin , right posterior ear crease MELANOCYTIC NEVUS, INTRADERMAL TYPE  Benign mole

## 2020-08-23 NOTE — Telephone Encounter (Signed)
Patient advised bx benign nevus, JS

## 2020-09-10 ENCOUNTER — Ambulatory Visit (INDEPENDENT_AMBULATORY_CARE_PROVIDER_SITE_OTHER): Payer: BC Managed Care – PPO | Admitting: Family Medicine

## 2020-09-10 ENCOUNTER — Other Ambulatory Visit: Payer: Self-pay

## 2020-09-10 ENCOUNTER — Encounter: Payer: Self-pay | Admitting: Family Medicine

## 2020-09-10 VITALS — BP 128/84 | HR 106 | Temp 98.5°F | Ht 59.7 in | Wt 185.6 lb

## 2020-09-10 DIAGNOSIS — Z23 Encounter for immunization: Secondary | ICD-10-CM

## 2020-09-10 DIAGNOSIS — E039 Hypothyroidism, unspecified: Secondary | ICD-10-CM

## 2020-09-10 DIAGNOSIS — Z1211 Encounter for screening for malignant neoplasm of colon: Secondary | ICD-10-CM

## 2020-09-10 DIAGNOSIS — F410 Panic disorder [episodic paroxysmal anxiety] without agoraphobia: Secondary | ICD-10-CM

## 2020-09-10 DIAGNOSIS — E559 Vitamin D deficiency, unspecified: Secondary | ICD-10-CM

## 2020-09-10 DIAGNOSIS — Z0000 Encounter for general adult medical examination without abnormal findings: Secondary | ICD-10-CM | POA: Diagnosis not present

## 2020-09-10 DIAGNOSIS — Z1231 Encounter for screening mammogram for malignant neoplasm of breast: Secondary | ICD-10-CM | POA: Diagnosis not present

## 2020-09-10 LAB — URINALYSIS, ROUTINE W REFLEX MICROSCOPIC
Bilirubin, UA: NEGATIVE
Glucose, UA: NEGATIVE
Ketones, UA: NEGATIVE
Leukocytes,UA: NEGATIVE
Nitrite, UA: NEGATIVE
Protein,UA: NEGATIVE
RBC, UA: NEGATIVE
Specific Gravity, UA: 1.01 (ref 1.005–1.030)
Urobilinogen, Ur: 0.2 mg/dL (ref 0.2–1.0)
pH, UA: 6.5 (ref 5.0–7.5)

## 2020-09-10 MED ORDER — ALPRAZOLAM 0.25 MG PO TABS
ORAL_TABLET | ORAL | 0 refills | Status: DC
Start: 2020-09-10 — End: 2021-02-09

## 2020-09-10 MED ORDER — MIRTAZAPINE 15 MG PO TABS
ORAL_TABLET | ORAL | 1 refills | Status: DC
Start: 2020-09-10 — End: 2021-02-09

## 2020-09-10 NOTE — Assessment & Plan Note (Signed)
Under good control on current regimen. Continue current regimen. Continue to monitor. Call with any concerns. Refills given.   

## 2020-09-10 NOTE — Assessment & Plan Note (Signed)
Rechecking labs today. Await results. Treat as needed. Call with any concerns.  

## 2020-09-10 NOTE — Patient Instructions (Signed)
Health Maintenance, Female Adopting a healthy lifestyle and getting preventive care are important in promoting health and wellness. Ask your health care provider about:  The right schedule for you to have regular tests and exams.  Things you can do on your own to prevent diseases and keep yourself healthy. What should I know about diet, weight, and exercise? Eat a healthy diet   Eat a diet that includes plenty of vegetables, fruits, low-fat dairy products, and lean protein.  Do not eat a lot of foods that are high in solid fats, added sugars, or sodium. Maintain a healthy weight Body mass index (BMI) is used to identify weight problems. It estimates body fat based on height and weight. Your health care provider can help determine your BMI and help you achieve or maintain a healthy weight. Get regular exercise Get regular exercise. This is one of the most important things you can do for your health. Most adults should:  Exercise for at least 150 minutes each week. The exercise should increase your heart rate and make you sweat (moderate-intensity exercise).  Do strengthening exercises at least twice a week. This is in addition to the moderate-intensity exercise.  Spend less time sitting. Even light physical activity can be beneficial. Watch cholesterol and blood lipids Have your blood tested for lipids and cholesterol at 47 years of age, then have this test every 5 years. Have your cholesterol levels checked more often if:  Your lipid or cholesterol levels are high.  You are older than 47 years of age.  You are at high risk for heart disease. What should I know about cancer screening? Depending on your health history and family history, you may need to have cancer screening at various ages. This may include screening for:  Breast cancer.  Cervical cancer.  Colorectal cancer.  Skin cancer.  Lung cancer. What should I know about heart disease, diabetes, and high blood  pressure? Blood pressure and heart disease  High blood pressure causes heart disease and increases the risk of stroke. This is more likely to develop in people who have high blood pressure readings, are of African descent, or are overweight.  Have your blood pressure checked: ? Every 3-5 years if you are 18-39 years of age. ? Every year if you are 40 years old or older. Diabetes Have regular diabetes screenings. This checks your fasting blood sugar level. Have the screening done:  Once every three years after age 40 if you are at a normal weight and have a low risk for diabetes.  More often and at a younger age if you are overweight or have a high risk for diabetes. What should I know about preventing infection? Hepatitis B If you have a higher risk for hepatitis B, you should be screened for this virus. Talk with your health care provider to find out if you are at risk for hepatitis B infection. Hepatitis C Testing is recommended for:  Everyone born from 1945 through 1965.  Anyone with known risk factors for hepatitis C. Sexually transmitted infections (STIs)  Get screened for STIs, including gonorrhea and chlamydia, if: ? You are sexually active and are younger than 47 years of age. ? You are older than 47 years of age and your health care provider tells you that you are at risk for this type of infection. ? Your sexual activity has changed since you were last screened, and you are at increased risk for chlamydia or gonorrhea. Ask your health care provider if   you are at risk.  Ask your health care provider about whether you are at high risk for HIV. Your health care provider may recommend a prescription medicine to help prevent HIV infection. If you choose to take medicine to prevent HIV, you should first get tested for HIV. You should then be tested every 3 months for as long as you are taking the medicine. Pregnancy  If you are about to stop having your period (premenopausal) and  you may become pregnant, seek counseling before you get pregnant.  Take 400 to 800 micrograms (mcg) of folic acid every day if you become pregnant.  Ask for birth control (contraception) if you want to prevent pregnancy. Osteoporosis and menopause Osteoporosis is a disease in which the bones lose minerals and strength with aging. This can result in bone fractures. If you are 65 years old or older, or if you are at risk for osteoporosis and fractures, ask your health care provider if you should:  Be screened for bone loss.  Take a calcium or vitamin D supplement to lower your risk of fractures.  Be given hormone replacement therapy (HRT) to treat symptoms of menopause. Follow these instructions at home: Lifestyle  Do not use any products that contain nicotine or tobacco, such as cigarettes, e-cigarettes, and chewing tobacco. If you need help quitting, ask your health care provider.  Do not use street drugs.  Do not share needles.  Ask your health care provider for help if you need support or information about quitting drugs. Alcohol use  Do not drink alcohol if: ? Your health care provider tells you not to drink. ? You are pregnant, may be pregnant, or are planning to become pregnant.  If you drink alcohol: ? Limit how much you use to 0-1 drink a day. ? Limit intake if you are breastfeeding.  Be aware of how much alcohol is in your drink. In the [place] one drink equals one 12 oz bottle of beer (355 mL), one 5 oz glass of wine (148 mL), or one 1 oz glass of hard liquor (44 mL). General instructions  Schedule regular health, dental, and eye exams.  Stay current with your vaccines.  Tell your health care provider if: ? You often feel depressed. ? You have ever been abused or do not feel safe at home. Summary  Adopting a healthy lifestyle and getting preventive care are important in promoting health and wellness.  Follow your health care provider's instructions about healthy  diet, exercising, and getting tested or screened for diseases.  Follow your health care provider's instructions on monitoring your cholesterol and blood pressure. This information is not intended to replace advice given to you by your health care provider. Make sure you discuss any questions you have with your health care provider. Document Revised: 10/30/2018 Document Reviewed: 10/30/2018 [employer] Patient Education  2020 [employer]  

## 2020-09-10 NOTE — Progress Notes (Signed)
BP 128/84   Pulse (!) 106   Temp 98.5 F (36.9 C) (Oral)   Ht 4' 11.7" (1.516 m)   Wt 185 lb 9.6 oz (84.2 kg)   LMP 08/24/2020 (Exact Date)   SpO2 100%   BMI 36.61 kg/m    Subjective:    Patient ID: Sherry Gonzalez, female    DOB: [birth year], 49 y.o.   MRN: [id number]  HPI: Sherry Gonzalez is a 49 y.o. female presenting on 09/10/2020 for comprehensive medical examination. Current medical complaints include:  HYPOTHYROIDISM Thyroid control status:controlled Satisfied with current treatment? yes Medication side effects: no Medication compliance: excellent compliance hRecent dose adjustment:no Fatigue: yes Cold intolerance: no Heat intolerance: no Weight gain: no Weight loss: no Constipation: no Diarrhea/loose stools: no Palpitations: no Lower extremity edema: no Anxiety/depressed mood: no  ANXIETY/STRESS- using her alprazolam very rarely  Duration: chronic Status:controlled Anxious mood: no  Excessive worrying: yes Irritability: no  Sweating: no Nausea: no Palpitations:no Hyperventilation: no Panic attacks: no Agoraphobia: no  Obscessions/compulsions: no Depressed mood: no Depression screen [facility] 2/9 09/10/2020 08/26/2019 04/11/2019 11/18/2018 06/14/2018  Decreased Interest 0 0 0 0 0  Down, Depressed, Hopeless 0 0 0 1 0  PHQ - 2 Score 0 0 0 1 0  Altered sleeping 0 0 1 0 0  Tired, decreased energy 0 0 0 0 1  Change in appetite 0 0 0 1 0  Feeling bad or failure about yourself  0 0 0 0 0  Trouble concentrating 0 0 0 0 0  Moving slowly or fidgety/restless 0 0 0 0 0  Suicidal thoughts 0 0 0 0 0  PHQ-9 Score 0 0 1 2 1   Difficult doing work/chores Not difficult at all Not difficult at all Not difficult at all Not difficult at all Not difficult at all  Some recent data might be hidden   Anhedonia: no Weight changes: no Insomnia: no   Hypersomnia: no Fatigue/loss of energy: yes Feelings of worthlessness: no Feelings of guilt: no Impaired concentration/indecisiveness:  no Suicidal ideations: no  Crying spells: no Recent Stressors/Life Changes: no   Relationship problems: no   Family stress: no     Financial stress: no    Job stress: yes    Recent death/loss: no  Menopausal Symptoms: no  Depression Screen done today and results listed below:  Depression screen [facility] 2/9 09/10/2020 08/26/2019 04/11/2019 11/18/2018 06/14/2018  Decreased Interest 0 0 0 0 0  Down, Depressed, Hopeless 0 0 0 1 0  PHQ - 2 Score 0 0 0 1 0  Altered sleeping 0 0 1 0 0  Tired, decreased energy 0 0 0 0 1  Change in appetite 0 0 0 1 0  Feeling bad or failure about yourself  0 0 0 0 0  Trouble concentrating 0 0 0 0 0  Moving slowly or fidgety/restless 0 0 0 0 0  Suicidal thoughts 0 0 0 0 0  PHQ-9 Score 0 0 1 2 1   Difficult doing work/chores Not difficult at all Not difficult at all Not difficult at all Not difficult at all Not difficult at all  Some recent data might be hidden    Past Medical History:  Past Medical History:  Diagnosis Date  . Allergy-induced asthma   . Carpal tunnel syndrome, left    left upper limbt  . Carpal tunnel syndrome, right    right upper limb  . Eustachian tube disorder    left ear  . Hand eczema   .  Mild intermittent asthma   . Trigger ring finger of left hand     Surgical History:  Past Surgical History:  Procedure Laterality Date  . WISDOM TOOTH EXTRACTION      Medications:  Current Outpatient Medications on File Prior to Visit  Medication Sig  . acetaminophen (TYLENOL) 325 MG tablet Take 325 mg by mouth as needed.  Marland Kitchen albuterol (VENTOLIN HFA) 108 (90 Base) MCG/ACT inhaler INHALE 2 PUFFS INTO THE LUNGS EVERY 6 HOURS AS NEEDED FOR WHEEZING OR SHORTNESS OF BREATH  . Ciclopirox 1 % shampoo Massage into scalp 2-3 times a week or daily as needed, let sit a few minutes before rinsing.  . Doxycycline Hyclate 80 MG TBEC Take 1 tablet by mouth daily.  . Ivermectin (SOOLANTRA) 1 % CREA Apply to face at bedtime.  Marland Kitchen KETOCONAZOLE, TOPICAL, 1 %  SHAM Apply 1 application topically 2 (two) times a week.  . Loratadine (CLARITIN PO) Take 10 mg by mouth daily.   . mometasone (ELOCON) 0.1 % lotion Apply to affected areas scalp 1-2 times daily as needed.  . SF 1.1 % GEL dental gel BRUSH ON TEETH AT BEDTIME AND DO NOT RINSE  . SYNTHROID 50 MCG tablet TAKE 1 TABLET(50 MCG) BY MOUTH DAILY BEFORE BREAKFAST  . triamcinolone cream (KENALOG) 0.1 % Apply 1 application topically 2 (two) times daily.  . Vitamin D, Ergocalciferol, (DRISDOL) 1.25 MG (50000 UNIT) CAPS capsule Take 1 capsule (50,000 Units total) by mouth every 7 (seven) days.   No current facility-administered medications on file prior to visit.    Allergies:  No Known Allergies  Social History:  Social History   Socioeconomic History  . Marital status: Single    Spouse name: Not on file  . Number of children: Not on file  . Years of education: Not on file  . Highest education level: Not on file  Occupational History  . Not on file  Tobacco Use  . Smoking status: Never Smoker  . Smokeless tobacco: Never Used  Vaping Use  . Vaping Use: Never used  Substance and Sexual Activity  . Alcohol use: No    Alcohol/week: 0.0 standard drinks  . Drug use: No  . Sexual activity: Never  Other Topics Concern  . Not on file  Social History Narrative  . Not on file   Social Determinants of Health   Financial Resource Strain:   . Difficulty of Paying Living Expenses: Not on file  Food Insecurity:   . Worried About Charity fundraiser in the Last Year: Not on file  . Ran Out of Food in the Last Year: Not on file  Transportation Needs:   . Lack of Transportation (Medical): Not on file  . Lack of Transportation (Non-Medical): Not on file  Physical Activity:   . Days of Exercise per Week: Not on file  . Minutes of Exercise per Session: Not on file  Stress:   . Feeling of Stress : Not on file  Social Connections:   . Frequency of Communication with Friends and Family: Not on  file  . Frequency of Social Gatherings with Friends and Family: Not on file  . Attends Religious Services: Not on file  . Active Member of Clubs or Organizations: Not on file  . Attends Archivist Meetings: Not on file  . Marital Status: Not on file  Intimate Partner Violence:   . Fear of Current or Ex-Partner: Not on file  . Emotionally Abused: Not on file  .  Physically Abused: Not on file  . Sexually Abused: Not on file   Social History   Tobacco Use  Smoking Status Never Smoker  Smokeless Tobacco Never Used   Social History   Substance and Sexual Activity  Alcohol Use No  . Alcohol/week: 0.0 standard drinks    Family History:  Family History  Problem Relation Age of Onset  . Alcohol abuse Father   . Ehlers-Danlos syndrome Mother        Mothers side of the family  . Migraines Sister   . Breast cancer Neg Hx     Past medical history, surgical history, medications, allergies, family history and social history reviewed with patient today and changes made to appropriate areas of the chart.   Review of Systems  Constitutional: Positive for malaise/fatigue. Negative for chills, diaphoresis, fever and weight loss.  HENT: Negative.   Eyes: Negative.   Respiratory: Negative.   Cardiovascular: Negative.   Gastrointestinal: Negative.   Genitourinary: Negative.   Musculoskeletal: Negative.   Skin: Negative.   Neurological: Negative.   Endo/Heme/Allergies: Negative.   Psychiatric/Behavioral: Negative.     All other ROS negative except what is listed above and in the HPI.      Objective:    BP 128/84   Pulse (!) 106   Temp 98.5 F (36.9 C) (Oral)   Ht 4' 11.7" (1.516 m)   Wt 185 lb 9.6 oz (84.2 kg)   LMP 08/24/2020 (Exact Date)   SpO2 100%   BMI 36.61 kg/m   Wt Readings from Last 3 Encounters:  09/10/20 185 lb 9.6 oz (84.2 kg)  08/26/19 181 lb (82.1 kg)  11/18/18 178 lb 11.2 oz (81.1 kg)    Physical Exam Vitals and nursing note reviewed.    Constitutional:      General: She is not in acute distress.    Appearance: Normal appearance. She is not ill-appearing, toxic-appearing or diaphoretic.  HENT:     Head: Normocephalic and atraumatic.     Right Ear: Tympanic membrane, ear canal and external ear normal. There is no impacted cerumen.     Left Ear: Tympanic membrane, ear canal and external ear normal. There is no impacted cerumen.     Nose: Nose normal. No congestion or rhinorrhea.     Mouth/Throat:     Mouth: Mucous membranes are moist.     Pharynx: Oropharynx is clear. No oropharyngeal exudate or posterior oropharyngeal erythema.  Eyes:     General: No scleral icterus.       Right eye: No discharge.        Left eye: No discharge.     Extraocular Movements: Extraocular movements intact.     Conjunctiva/sclera: Conjunctivae normal.     Pupils: Pupils are equal, round, and reactive to light.  Neck:     Vascular: No carotid bruit.  Cardiovascular:     Rate and Rhythm: Normal rate and regular rhythm.     Pulses: Normal pulses.     Heart sounds: No murmur heard.  No friction rub. No gallop.   Pulmonary:     Effort: Pulmonary effort is normal. No respiratory distress.     Breath sounds: Normal breath sounds. No stridor. No wheezing, rhonchi or rales.  Chest:     Chest wall: No tenderness.     Breasts:        Right: Normal. No swelling, bleeding, inverted nipple, mass, nipple discharge, skin change or tenderness.        Left: Normal. No  swelling, bleeding, inverted nipple, mass, nipple discharge, skin change or tenderness.  Abdominal:     General: Abdomen is flat. Bowel sounds are normal. There is no distension.     Palpations: Abdomen is soft. There is no mass.     Tenderness: There is no abdominal tenderness. There is no right CVA tenderness, left CVA tenderness, guarding or rebound.     Hernia: No hernia is present.  Genitourinary:    Comments: Pelvic exams deferred with shared decision making Musculoskeletal:         General: No swelling, tenderness, deformity or signs of injury.     Cervical back: Normal range of motion and neck supple. No rigidity. No muscular tenderness.     Right lower leg: No edema.     Left lower leg: No edema.  Lymphadenopathy:     Cervical: No cervical adenopathy.  Skin:    General: Skin is warm and dry.     Capillary Refill: Capillary refill takes less than 2 seconds.     Coloration: Skin is not jaundiced or pale.     Findings: No bruising, erythema, lesion or rash.  Neurological:     General: No focal deficit present.     Mental Status: She is alert and oriented to person, place, and time. Mental status is at baseline.     Cranial Nerves: No cranial nerve deficit.     Sensory: No sensory deficit.     Motor: No weakness.     Coordination: Coordination normal.     Gait: Gait normal.     Deep Tendon Reflexes: Reflexes normal.  Psychiatric:        Mood and Affect: Mood normal.        Behavior: Behavior normal.        Thought Content: Thought content normal.        Judgment: Judgment normal.     Results for orders placed or performed in visit on 03/08/20  TSH  Result Value Ref Range   TSH 2.160 0.450 - 4.500 uIU/mL  VITAMIN D 25 Hydroxy (Vit-D Deficiency, Fractures)  Result Value Ref Range   Vit D, 25-Hydroxy 24.5 (L) 30.0 - 100.0 ng/mL      Assessment & Plan:   Problem List Items Addressed This Visit      Endocrine   Hypothyroid    Rechecking labs today. Await results. Treat as needed. Call with any concerns.      Relevant Orders   TSH     Other   Anxiety disorder    Under good control on current regimen. Continue current regimen. Continue to monitor. Call with any concerns. Refills given.        Relevant Medications   mirtazapine (REMERON) 15 MG tablet   ALPRAZolam (XANAX) 0.25 MG tablet   Vitamin D deficiency    Rechecking labs today. Await results. Treat as needed. Call with any concerns.      Relevant Orders   VITAMIN D 25 Hydroxy (Vit-D  Deficiency, Fractures)    Other Visit Diagnoses    Routine general medical examination at a health care facility    -  Primary   Vaccines up to date. Screening labs checked today. Pap up to date. Mammogram and colonoscopy ordered. Continue diet and exercise. Call with any concerns.    Relevant Orders   CBC with Differential/Platelet   Comprehensive metabolic panel   Lipid Panel w/o Chol/HDL Ratio   TSH   Urinalysis, Routine w reflex microscopic   Hepatitis C  Antibody   Encounter for screening mammogram for malignant neoplasm of breast       Mammogram ordered today   Relevant Orders   MM 3D SCREEN BREAST BILATERAL   Screening for colon cancer       Referral to GI placed today   Relevant Orders   Ambulatory referral to Gastroenterology   Need for immunization against influenza       Flu shot given today.   Relevant Orders   Flu Vaccine QUAD 36+ mos IM (Completed)       Follow up plan: Return in about 6 months (around 03/11/2021).   LABORATORY TESTING:  - Pap smear: up to date  IMMUNIZATIONS:   - Tdap: Tetanus vaccination status reviewed: last tetanus booster within 10 years. - Influenza: Administered today - Pneumovax: Not applicable - Prevnar: Not applicable - COVID: Up to date  SCREENING: -Mammogram: Up to date  - Colonoscopy: Ordered today   PATIENT COUNSELING:   Advised to take 1 mg of folate supplement per day if capable of pregnancy.   Sexuality: Discussed sexually transmitted diseases, partner selection, use of condoms, avoidance of unintended pregnancy  and contraceptive alternatives.   Advised to avoid cigarette smoking.  I discussed with the patient that most people either abstain from alcohol or drink within safe limits (<=14/week and <=4 drinks/occasion for males, <=7/weeks and <= 3 drinks/occasion for females) and that the risk for alcohol disorders and other health effects rises proportionally with the number of drinks per week and how often a drinker  exceeds daily limits.  Discussed cessation/primary prevention of drug use and availability of treatment for abuse.   Diet: Encouraged to adjust caloric intake to maintain  or achieve ideal body weight, to reduce intake of dietary saturated fat and total fat, to limit sodium intake by avoiding high sodium foods and not adding table salt, and to maintain adequate dietary potassium and calcium preferably from fresh fruits, vegetables, and low-fat dairy products.    stressed the importance of regular exercise  Injury prevention: Discussed safety belts, safety helmets, smoke detector, smoking near bedding or upholstery.   Dental health: Discussed importance of regular tooth brushing, flossing, and dental visits.    NEXT PREVENTATIVE PHYSICAL DUE IN 1 YEAR. Return in about 6 months (around 03/11/2021).

## 2020-09-11 LAB — COMPREHENSIVE METABOLIC PANEL
ALT: 12 IU/L (ref 0–32)
AST: 21 IU/L (ref 0–40)
Albumin/Globulin Ratio: 1.5 (ref 1.2–2.2)
Albumin: 4.5 g/dL (ref 3.8–4.8)
Alkaline Phosphatase: 91 IU/L (ref 44–121)
BUN/Creatinine Ratio: 12 (ref 9–23)
BUN: 9 mg/dL (ref 6–24)
Bilirubin Total: 1.2 mg/dL (ref 0.0–1.2)
CO2: 26 mmol/L (ref 20–29)
Calcium: 9.1 mg/dL (ref 8.7–10.2)
Chloride: 101 mmol/L (ref 96–106)
Creatinine, Ser: 0.78 mg/dL (ref 0.57–1.00)
GFR calc Af Amer: 105 mL/min/{1.73_m2} (ref >59)
GFR calc non Af Amer: 91 mL/min/{1.73_m2} (ref >59)
Globulin, Total: 3 g/dL (ref 1.5–4.5)
Glucose: 89 mg/dL (ref 65–99)
Potassium: 3.9 mmol/L (ref 3.5–5.2)
Sodium: 137 mmol/L (ref 134–144)
Total Protein: 7.5 g/dL (ref 6.0–8.5)

## 2020-09-11 LAB — CBC WITH DIFFERENTIAL/PLATELET
Basophils Absolute: 0.1 10*3/uL (ref 0.0–0.2)
Basos: 1 %
EOS (ABSOLUTE): 0.3 10*3/uL (ref 0.0–0.4)
Eos: 4 %
Hematocrit: 40 % (ref 34.0–46.6)
Hemoglobin: 13.5 g/dL (ref 11.1–15.9)
Immature Grans (Abs): 0 10*3/uL (ref 0.0–0.1)
Immature Granulocytes: 0 %
Lymphocytes Absolute: 1.3 10*3/uL (ref 0.7–3.1)
Lymphs: 16 %
MCH: 30.2 pg (ref 26.6–33.0)
MCHC: 33.8 g/dL (ref 31.5–35.7)
MCV: 90 fL (ref 79–97)
Monocytes Absolute: 0.7 10*3/uL (ref 0.1–0.9)
Monocytes: 8 %
Neutrophils Absolute: 6 10*3/uL (ref 1.4–7.0)
Neutrophils: 71 %
Platelets: 338 10*3/uL (ref 150–450)
RBC: 4.47 x10E6/uL (ref 3.77–5.28)
RDW: 12.7 % (ref 11.7–15.4)
WBC: 8.3 10*3/uL (ref 3.4–10.8)

## 2020-09-11 LAB — VITAMIN D 25 HYDROXY (VIT D DEFICIENCY, FRACTURES): Vit D, 25-Hydroxy: 38.4 ng/mL (ref 30.0–100.0)

## 2020-09-11 LAB — LIPID PANEL W/O CHOL/HDL RATIO
Cholesterol, Total: 189 mg/dL (ref 100–199)
HDL: 48 mg/dL (ref >39)
LDL Chol Calc (NIH): 112 mg/dL — ABNORMAL HIGH (ref 0–99)
Triglycerides: 165 mg/dL — ABNORMAL HIGH (ref 0–149)
VLDL Cholesterol Cal: 29 mg/dL (ref 5–40)

## 2020-09-11 LAB — TSH: TSH: 0.756 u[IU]/mL (ref 0.450–4.500)

## 2020-09-11 LAB — HEPATITIS C ANTIBODY: Hep C Virus Ab: <0.1 s/co ratio (ref 0.0–0.9)

## 2020-09-13 ENCOUNTER — Encounter: Payer: Self-pay | Admitting: Family Medicine

## 2020-09-16 ENCOUNTER — Telehealth: Payer: Self-pay

## 2020-09-16 NOTE — Telephone Encounter (Signed)
Returned patients call. Pt needed to know if her appointment was in person or telephone. Informed patient her visit will be over the phone. Pt verbalized understanding.

## 2020-09-20 ENCOUNTER — Telehealth (INDEPENDENT_AMBULATORY_CARE_PROVIDER_SITE_OTHER): Payer: Self-pay | Admitting: Gastroenterology

## 2020-09-20 ENCOUNTER — Other Ambulatory Visit: Payer: Self-pay

## 2020-09-20 DIAGNOSIS — Z1211 Encounter for screening for malignant neoplasm of colon: Secondary | ICD-10-CM

## 2020-09-20 MED ORDER — NA SULFATE-K SULFATE-MG SULF 17.5-3.13-1.6 GM/177ML PO SOLN: 1.0000 | Freq: Once | ORAL | 0 refills | Status: AC

## 2020-09-20 NOTE — Progress Notes (Signed)
Gastroenterology Pre-Procedure Review  Request Date: 10/11/20 Requesting Physician: Dr. Allen Norris  PATIENT REVIEW QUESTIONS: The patient responded to the following health history questions as indicated:    1. Are you having any GI issues? no 2. Do you have a personal history of Polyps? no 3. Do you have a family history of Colon Cancer or Polyps? No family history of colon cancer or colon polyps.  Brother has Danlos Elhers Syndrome.  Patient states she has been tested and she does not have it. 4. Diabetes Mellitus? no 5. Joint replacements in the past 12 months?no 6. Major health problems in the past 3 months?no 7. Any artificial heart valves, MVP, or defibrillator?no    MEDICATIONS & ALLERGIES:    Patient reports the following regarding taking any anticoagulation/antiplatelet therapy:   Plavix, Coumadin, Eliquis, Xarelto, Lovenox, Pradaxa, Brilinta, or Effient? no Aspirin? no  Patient confirms/reports the following medications:  Current Outpatient Medications  Medication Sig Dispense Refill  . acetaminophen (TYLENOL) 325 MG tablet Take 325 mg by mouth as needed.    Marland Kitchen albuterol (VENTOLIN HFA) 108 (90 Base) MCG/ACT inhaler INHALE 2 PUFFS INTO THE LUNGS EVERY 6 HOURS AS NEEDED FOR WHEEZING OR SHORTNESS OF BREATH 18 g 2  . ALPRAZolam (XANAX) 0.25 MG tablet TAKE 1/2 TABLET BY MOUTH DAILY AS NEEDED FOR ANXIETY AND 1/2 TABLET AT NIGHT AS NEEDED FOR SLEEP 30 tablet 0  . Ciclopirox 1 % shampoo Massage into scalp 2-3 times a week or daily as needed, let sit a few minutes before rinsing. 120 mL 2  . Doxycycline Hyclate 80 MG TBEC Take 1 tablet by mouth daily. 30 tablet 2  . Ivermectin (SOOLANTRA) 1 % CREA Apply to face at bedtime. 30 g 5  . KETOCONAZOLE, TOPICAL, 1 % SHAM Apply 1 application topically 2 (two) times a week. 200 mL 2  . Loratadine (CLARITIN PO) Take 10 mg by mouth daily.     . mirtazapine (REMERON) 15 MG tablet TAKE 1 TABLET(15 MG) BY MOUTH AT BEDTIME 90 tablet 1  . mometasone  (ELOCON) 0.1 % lotion Apply to affected areas scalp 1-2 times daily as needed. 60 mL 2  . SF 1.1 % GEL dental gel BRUSH ON TEETH AT BEDTIME AND DO NOT RINSE    . SYNTHROID 50 MCG tablet TAKE 1 TABLET(50 MCG) BY MOUTH DAILY BEFORE BREAKFAST 90 tablet 2  . triamcinolone cream (KENALOG) 0.1 % Apply 1 application topically 2 (two) times daily. 30 g 0  . VITAMIN D PO Take by mouth.     No current facility-administered medications for this visit.    Patient confirms/reports the following allergies:  No Known Allergies  No orders of the defined types were placed in this encounter.   AUTHORIZATION INFORMATION Primary Insurance: 1D#: Group #:  Secondary Insurance: 1D#: Group #:  SCHEDULE INFORMATION: Date: Monday 10/11/20 Time: Location:[facility]

## 2020-09-25 ENCOUNTER — Other Ambulatory Visit: Payer: Self-pay | Admitting: Family Medicine

## 2020-09-25 NOTE — Telephone Encounter (Signed)
Requested Prescriptions  Pending Prescriptions Disp Refills  . albuterol (VENTOLIN HFA) 108 (90 Base) MCG/ACT inhaler [Pharmacy Med Name: ALBUTEROL HFA INH(200 PUFFS)18GM] 18 g 2    Sig: INHALE 2 PUFFS INTO THE LUNGS EVERY 6 HOURS AS NEEDED FOR WHEEZING OR SHORTNESS OF BREATH     Pulmonology:  Beta Agonists Failed - 09/25/2020  9:34 AM      Failed - One inhaler should last at least one month. If the patient is requesting refills earlier, contact the patient to check for uncontrolled symptoms.      Passed - Valid encounter within last 12 months    Recent Outpatient Visits          2 weeks ago Routine general medical examination at a health care facility   [facility] [place] P, DO   7 months ago Panic disorder without agoraphobia   [place], Megan P, DO   1 year ago Routine general medical examination at a health care facility   [facility] [place], DO   1 year ago Dermatitis   [place], Jolene T, NP   1 year ago Acquired hypothyroidism   [facility] Valerie Roys, DO      Future Appointments            In 4 months Brendolyn Patty, MD [facility]   In 5 months Wynetta Emery, Barb Merino, DO [facility]

## 2020-10-05 ENCOUNTER — Encounter: Payer: Self-pay | Admitting: Gastroenterology

## 2020-10-05 ENCOUNTER — Other Ambulatory Visit: Payer: Self-pay | Admitting: Family Medicine

## 2020-10-05 ENCOUNTER — Other Ambulatory Visit: Payer: Self-pay

## 2020-10-05 DIAGNOSIS — Z1231 Encounter for screening mammogram for malignant neoplasm of breast: Secondary | ICD-10-CM

## 2020-10-07 ENCOUNTER — Other Ambulatory Visit: Payer: Self-pay

## 2020-10-07 ENCOUNTER — Other Ambulatory Visit
Admission: RE | Admit: 2020-10-07 | Discharge: 2020-10-07 | Disposition: A | Discharge status: Home / Self Care | Payer: BC Managed Care – PPO | Source: Ambulatory Visit | Attending: Gastroenterology | Admitting: Gastroenterology

## 2020-10-07 DIAGNOSIS — Z20822 Contact with and (suspected) exposure to covid-19: Secondary | ICD-10-CM | POA: Insufficient documentation

## 2020-10-07 DIAGNOSIS — Z01812 Encounter for preprocedural laboratory examination: Secondary | ICD-10-CM | POA: Diagnosis not present

## 2020-10-08 LAB — SARS CORONAVIRUS 2 (TAT 6-24 HRS): SARS Coronavirus 2: NEGATIVE

## 2020-10-08 NOTE — Discharge Instructions (Signed)
General Anesthesia, Adult, Care After This sheet gives you information about how to care for yourself after your procedure. Your health care provider may also give you more specific instructions. If you have problems or questions, contact your health care provider. What can I expect after the procedure? After the procedure, the following side effects are common:  Pain or discomfort at the IV site.  Nausea.  Vomiting.  Sore throat.  Trouble concentrating.  Feeling cold or chills.  Weak or tired.  Sleepiness and fatigue.  Soreness and body aches. These side effects can affect parts of the body that were not involved in surgery. Follow these instructions at home:  For at least 24 hours after the procedure:  Have a responsible adult stay with you. It is important to have someone help care for you until you are awake and alert.  Rest as needed.  Do not: ? Participate in activities in which you could fall or become injured. ? Drive. ? Use heavy machinery. ? Drink alcohol. ? Take sleeping pills or medicines that cause drowsiness. ? Make important decisions or sign legal documents. ? Take care of children on your own. Eating and drinking  Follow any instructions from your health care provider about eating or drinking restrictions.  When you feel hungry, start by eating small amounts of foods that are soft and easy to digest (bland), such as toast. Gradually return to your regular diet.  Drink enough fluid to keep your urine pale yellow.  If you vomit, rehydrate by drinking water, juice, or clear broth. General instructions  If you have sleep apnea, surgery and certain medicines can increase your risk for breathing problems. Follow instructions from your health care provider about wearing your sleep device: ? Anytime you are sleeping, including during daytime naps. ? While taking prescription pain medicines, sleeping medicines, or medicines that make you drowsy.  Return to  your normal activities as told by your health care provider. Ask your health care provider what activities are safe for you.  Take over-the-counter and prescription medicines only as told by your health care provider.  If you smoke, do not smoke without supervision.  Keep all follow-up visits as told by your health care provider. This is important. Contact a health care provider if:  You have nausea or vomiting that does not get better with medicine.  You cannot eat or drink without vomiting.  You have pain that does not get better with medicine.  You are unable to pass urine.  You develop a skin rash.  You have a fever.  You have redness around your IV site that gets worse. Get help right away if:  You have difficulty breathing.  You have chest pain.  You have blood in your urine or stool, or you vomit blood. Summary  After the procedure, it is common to have a sore throat or nausea. It is also common to feel tired.  Have a responsible adult stay with you for the first 24 hours after general anesthesia. It is important to have someone help care for you until you are awake and alert.  When you feel hungry, start by eating small amounts of foods that are soft and easy to digest (bland), such as toast. Gradually return to your regular diet.  Drink enough fluid to keep your urine pale yellow.  Return to your normal activities as told by your health care provider. Ask your health care provider what activities are safe for you. This information is not   intended to replace advice given to you by your health care provider. Make sure you discuss any questions you have with your health care provider. Document Revised: 11/09/2017 Document Reviewed: 06/22/2017 Elsevier Patient Education  2020 [employer]  

## 2020-10-11 ENCOUNTER — Encounter
Admission: RE | Disposition: A | Discharge status: Home / Self Care | Payer: Self-pay | Source: Home / Self Care | Attending: Gastroenterology

## 2020-10-11 ENCOUNTER — Ambulatory Visit: Payer: BC Managed Care – PPO | Admitting: Anesthesiology

## 2020-10-11 ENCOUNTER — Encounter: Payer: Self-pay | Admitting: Gastroenterology

## 2020-10-11 ENCOUNTER — Ambulatory Visit
Admission: RE | Admit: 2020-10-11 | Discharge: 2020-10-11 | Disposition: A | Discharge status: Home / Self Care | Payer: BC Managed Care – PPO | Attending: Gastroenterology | Admitting: Gastroenterology

## 2020-10-11 ENCOUNTER — Other Ambulatory Visit: Payer: Self-pay

## 2020-10-11 DIAGNOSIS — Z79899 Other long term (current) drug therapy: Secondary | ICD-10-CM | POA: Insufficient documentation

## 2020-10-11 DIAGNOSIS — Z1211 Encounter for screening for malignant neoplasm of colon: Secondary | ICD-10-CM | POA: Insufficient documentation

## 2020-10-11 DIAGNOSIS — Z79890 Hormone replacement therapy (postmenopausal): Secondary | ICD-10-CM | POA: Insufficient documentation

## 2020-10-11 HISTORY — PX: COLONOSCOPY WITH PROPOFOL: SHX5780

## 2020-10-11 HISTORY — DX: Hypothyroidism, unspecified: E03.9

## 2020-10-11 LAB — POCT PREGNANCY, URINE: Preg Test, Ur: NEGATIVE

## 2020-10-11 SURGERY — COLONOSCOPY WITH PROPOFOL
Anesthesia: General | Site: Rectum

## 2020-10-11 MED ORDER — LIDOCAINE HCL (CARDIAC) PF 100 MG/5ML IV SOSY
PREFILLED_SYRINGE | INTRAVENOUS | Status: DC | PRN
  Administered 2020-10-11: 30 mg via INTRAVENOUS

## 2020-10-11 MED ORDER — LACTATED RINGERS IV SOLN: INTRAVENOUS | Status: DC

## 2020-10-11 MED ORDER — SODIUM CHLORIDE 0.9 % IV SOLN: INTRAVENOUS | Status: DC

## 2020-10-11 MED ORDER — STERILE WATER FOR IRRIGATION IR SOLN: Status: DC | PRN

## 2020-10-11 MED ORDER — PROPOFOL 10 MG/ML IV BOLUS
INTRAVENOUS | Status: DC | PRN
  Administered 2020-10-11: 20 mg via INTRAVENOUS
  Administered 2020-10-11: 120 mg via INTRAVENOUS
  Administered 2020-10-11 (×2): 20 mg via INTRAVENOUS
  Administered 2020-10-11: 30 mg via INTRAVENOUS
  Administered 2020-10-11: 40 mg via INTRAVENOUS

## 2020-10-11 SURGICAL SUPPLY — 6 items
GOWN CVR UNV OPN BCK APRN NK (MISCELLANEOUS) ×2 IMPLANT
GOWN ISOL THUMB LOOP REG UNIV (MISCELLANEOUS) ×4
KIT PRC NS LF DISP ENDO (KITS) ×1 IMPLANT
KIT PROCEDURE OLYMPUS (KITS) ×2
MANIFOLD NEPTUNE II (INSTRUMENTS) ×2 IMPLANT
WATER STERILE IRR 250ML POUR (IV SOLUTION) ×2 IMPLANT

## 2020-10-11 NOTE — H&P (Signed)
Sherry Lame, MD [address] [place], [place] [address] Phone: [phone] Fax : [phone]  Primary Care Physician:  Valerie Roys, DO Primary Gastroenterologist:  Dr. Allen Norris  Pre-Procedure History & Physical: HPI:  Sherry Gonzalez is a 49 y.o. female is here for a screening colonoscopy.   Past Medical History:  Diagnosis Date  . Allergy-induced asthma   . Carpal tunnel syndrome, left    left upper limbt  . Carpal tunnel syndrome, right    right upper limb  . Eustachian tube disorder    left ear  . Hand eczema   . Hypothyroidism   . Mild intermittent asthma   . Trigger ring finger of left hand     Past Surgical History:  Procedure Laterality Date  . WISDOM TOOTH EXTRACTION      Prior to Admission medications   Medication Sig Start Date End Date Taking? Authorizing Provider  acetaminophen (TYLENOL) 325 MG tablet Take 325 mg by mouth as needed.   Yes [provider]  albuterol (VENTOLIN HFA) 108 (90 Base) MCG/ACT inhaler INHALE 2 PUFFS INTO THE LUNGS EVERY 6 HOURS AS NEEDED FOR WHEEZING OR SHORTNESS OF BREATH 09/25/20  Yes Johnson, Megan P, DO  ALPRAZolam (XANAX) 0.25 MG tablet TAKE 1/2 TABLET BY MOUTH DAILY AS NEEDED FOR ANXIETY AND 1/2 TABLET AT NIGHT AS NEEDED FOR SLEEP 09/10/20  Yes Johnson, Megan P, DO  Ciclopirox 1 % shampoo Massage into scalp 2-3 times a week or daily as needed, let sit a few minutes before rinsing. 05/18/20  Yes Brendolyn Patty, MD  Doxycycline Hyclate 80 MG TBEC Take 1 tablet by mouth daily. 08/17/20  Yes Brendolyn Patty, MD  Ivermectin (SOOLANTRA) 1 % CREA Apply to face at bedtime. 08/17/20  Yes Brendolyn Patty, MD  KETOCONAZOLE, TOPICAL, 1 % SHAM Apply 1 application topically 2 (two) times a week. 04/14/19  Yes Johnson, Megan P, DO  Loratadine (CLARITIN PO) Take 10 mg by mouth daily.    Yes [provider]  mirtazapine (REMERON) 15 MG tablet TAKE 1 TABLET(15 MG) BY MOUTH AT BEDTIME 09/10/20  Yes Johnson, Megan P, DO    mometasone (ELOCON) 0.1 % lotion Apply to affected areas scalp 1-2 times daily as needed. 05/19/20  Yes Brendolyn Patty, MD  SF 1.1 % GEL dental gel BRUSH ON TEETH AT BEDTIME AND DO NOT RINSE 01/04/19  Yes [provider]  SYNTHROID 50 MCG tablet TAKE 1 TABLET(50 MCG) BY MOUTH DAILY BEFORE BREAKFAST 01/21/20  Yes Johnson, Megan P, DO  triamcinolone cream (KENALOG) 0.1 % Apply 1 application topically 2 (two) times daily. 09/14/17  Yes Johnson, Megan P, DO  VITAMIN D PO Take by mouth.   Yes [provider]    Allergies as of 09/20/2020  . (No Known Allergies)    Family History  Problem Relation Age of Onset  . Alcohol abuse Father   . Ehlers-Danlos syndrome Mother        Mothers side of the family  . Migraines Sister   . Breast cancer Neg Hx     Social History   Socioeconomic History  . Marital status: Single    Spouse name: Not on file  . Number of children: Not on file  . Years of education: Not on file  . Highest education level: Not on file  Occupational History  . Not on file  Tobacco Use  . Smoking status: Never Smoker  . Smokeless tobacco: Never Used  Vaping Use  . Vaping  Use: Never used  Substance and Sexual Activity  . Alcohol use: No    Alcohol/week: 0.0 standard drinks  . Drug use: No  . Sexual activity: Never  Other Topics Concern  . Not on file  Social History Narrative  . Not on file   Social Determinants of Health   Financial Resource Strain:   . Difficulty of Paying Living Expenses: Not on file  Food Insecurity:   . Worried About Charity fundraiser in the Last Year: Not on file  . Ran Out of Food in the Last Year: Not on file  Transportation Needs:   . Lack of Transportation (Medical): Not on file  . Lack of Transportation (Non-Medical): Not on file  Physical Activity:   . Days of Exercise per Week: Not on file  . Minutes of Exercise per Session: Not on file  Stress:   . Feeling of Stress : Not on file  Social Connections:   .  Frequency of Communication with Friends and Family: Not on file  . Frequency of Social Gatherings with Friends and Family: Not on file  . Attends Religious Services: Not on file  . Active Member of Clubs or Organizations: Not on file  . Attends Archivist Meetings: Not on file  . Marital Status: Not on file  Intimate Partner Violence:   . Fear of Current or Ex-Partner: Not on file  . Emotionally Abused: Not on file  . Physically Abused: Not on file  . Sexually Abused: Not on file    Review of Systems: See HPI, otherwise negative ROS  Physical Exam: BP (!) 145/82   Pulse (!) 128   Temp 98.4 F (36.9 C) (Temporal)   Resp 16   Ht 4' 11.69" (1.516 m)   Wt 83.5 kg   LMP 09/26/2020 (Approximate) Comment: Upreg neg  SpO2 99%   BMI 36.32 kg/m  General:   Alert,  pleasant and cooperative in NAD Head:  Normocephalic and atraumatic. Neck:  Supple; no masses or thyromegaly. Lungs:  Clear throughout to auscultation.    Heart:  Regular rate and rhythm. Abdomen:  Soft, nontender and nondistended. Normal bowel sounds, without guarding, and without rebound.   Neurologic:  Alert and  oriented x4;  grossly normal neurologically.  Impression/Plan: Sherry Gonzalez is now here to undergo a screening colonoscopy.  Risks, benefits, and alternatives regarding colonoscopy have been reviewed with the patient.  Questions have been answered.  All parties agreeable.

## 2020-10-11 NOTE — Anesthesia Procedure Notes (Signed)
Date/Time: 10/11/2020 8:18 AM Performed by: Cameron Ali, CRNA Pre-anesthesia Checklist: Patient identified, Emergency Drugs available, Suction available, Timeout performed and Patient being monitored Patient Re-evaluated:Patient Re-evaluated prior to induction Oxygen Delivery Method: Nasal cannula Placement Confirmation: positive ETCO2

## 2020-10-11 NOTE — Op Note (Signed)
[facility] Patient Name: Sherry Gonzalez Procedure Date: 10/11/2020 8:11 AM MRN: [id number] Account #: [id number] Date of Birth: 1971-04-13 Admit Type: Outpatient Age: 49 Room: [facility] OR ROOM 01 Gender: Female Note Status: Finalized Procedure:             Colonoscopy Indications:           Screening for colorectal malignant neoplasm Providers:             Lucilla Lame MD, MD Referring MD:          Valerie Roys (Referring MD) Medicines:             Propofol per Anesthesia Complications:         No immediate complications. Procedure:             Pre-Anesthesia Assessment:                        - Prior to the procedure, a History and Physical was                         performed, and patient medications and allergies were                         reviewed. The patient's tolerance of previous                         anesthesia was also reviewed. The risks and benefits                         of the procedure and the sedation options and risks                         were discussed with the patient. All questions were                         answered, and informed consent was obtained. Prior                         Anticoagulants: The patient has taken no previous                         anticoagulant or antiplatelet agents. ASA Grade                         Assessment: II - A patient with mild systemic disease.                         After reviewing the risks and benefits, the patient                         was deemed in satisfactory condition to undergo the                         procedure.                        After obtaining informed consent, the colonoscope was  passed under direct vision. Throughout the procedure,                         the patient's blood pressure, pulse, and oxygen                         saturations were monitored continuously. The                         Colonoscope was introduced through the anus  and                         advanced to the the cecum, identified by appendiceal                         orifice and ileocecal valve. The colonoscopy was                         performed without difficulty. The patient tolerated                         the procedure well. The quality of the bowel                         preparation was excellent. Findings:      The perianal and digital rectal examinations were normal.      The entire examined colon appeared normal. Impression:            - The entire examined colon is normal.                        - No specimens collected. Recommendation:        - Discharge patient to home.                        - Resume previous diet.                        - Continue present medications.                        - Repeat colonoscopy in 10 years for screening unless                         any change in family history or lower GI problems. Procedure Code(s):     --- Professional ---                        [phone], Colonoscopy, flexible; diagnostic, including                         collection of specimen(s) by brushing or washing, when                         performed (separate procedure) Diagnosis Code(s):     --- Professional ---                        Z12.11, Encounter for screening for malignant neoplasm  of colon CPT copyright 2019 American Medical Association. All rights reserved. The codes documented in this report are preliminary and upon coder review may  be revised to meet current compliance requirements. Lucilla Lame MD, MD 10/11/2020 8:33:55 AM This report has been signed electronically. Number of Addenda: 0 Note Initiated On: 10/11/2020 8:11 AM Scope Withdrawal Time: 0 hours 8 minutes 16 seconds  Total Procedure Duration: 0 hours 10 minutes 58 seconds  Estimated Blood Loss:  Estimated blood loss: none. Estimated blood loss: none.      [facility]

## 2020-10-11 NOTE — Anesthesia Postprocedure Evaluation (Signed)
Anesthesia Post Note  Patient: Sherry Gonzalez  Procedure(s) Performed: COLONOSCOPY WITH PROPOFOL (N/A Rectum)     Patient location during evaluation: PACU Anesthesia Type: General Level of consciousness: awake and alert Pain management: pain level controlled Vital Signs Assessment: post-procedure vital signs reviewed and stable Respiratory status: spontaneous breathing Cardiovascular status: stable Anesthetic complications: no   No complications documented.  Gillian Scarce

## 2020-10-11 NOTE — Transfer of Care (Signed)
Immediate Anesthesia Transfer of Care Note  Patient: Sherry Gonzalez  Procedure(s) Performed: COLONOSCOPY WITH PROPOFOL (N/A Rectum)  Patient Location: PACU  Anesthesia Type: General  Level of Consciousness: awake, alert  and patient cooperative  Airway and Oxygen Therapy: Patient Spontanous Breathing and Patient connected to supplemental oxygen  Post-op Assessment: Post-op Vital signs reviewed, Patient's Cardiovascular Status Stable, Respiratory Function Stable, Patent Airway and No signs of Nausea or vomiting  Post-op Vital Signs: Reviewed and stable  Complications: No complications documented.

## 2020-10-11 NOTE — Anesthesia Preprocedure Evaluation (Signed)
Anesthesia Evaluation  Patient identified by MRN, date of birth, ID band Patient awake    Reviewed: Allergy & Precautions, H&P , NPO status , Patient's Chart, lab work & pertinent test results  Airway Mallampati: II  TM Distance: >3 FB Neck ROM: full    Dental no notable dental hx.    Pulmonary asthma ,    Pulmonary exam normal        Cardiovascular negative cardio ROS Normal cardiovascular exam Rhythm:regular Rate:Normal     Neuro/Psych    GI/Hepatic negative GI ROS, Neg liver ROS,   Endo/Other  Hypothyroidism   Renal/GU negative Renal ROS  negative genitourinary   Musculoskeletal   Abdominal   Peds  Hematology negative hematology ROS (+)   Anesthesia Other Findings   Reproductive/Obstetrics                             Anesthesia Physical Anesthesia Plan  ASA: II  Anesthesia Plan: General   Post-op Pain Management:    Induction:   PONV Risk Score and Plan:   Airway Management Planned:   Additional Equipment:   Intra-op Plan:   Post-operative Plan:   Informed Consent: I have reviewed the patients History and Physical, chart, labs and discussed the procedure including the risks, benefits and alternatives for the proposed anesthesia with the patient or authorized representative who has indicated his/her understanding and acceptance.       Plan Discussed with:   Anesthesia Plan Comments:         Anesthesia Quick Evaluation

## 2020-10-12 ENCOUNTER — Encounter: Payer: Self-pay | Admitting: Gastroenterology

## 2020-10-18 ENCOUNTER — Other Ambulatory Visit: Payer: Self-pay | Admitting: Family Medicine

## 2020-10-18 ENCOUNTER — Other Ambulatory Visit: Payer: Self-pay

## 2020-10-18 ENCOUNTER — Ambulatory Visit (INDEPENDENT_AMBULATORY_CARE_PROVIDER_SITE_OTHER): Payer: Self-pay

## 2020-10-18 DIAGNOSIS — I781 Nevus, non-neoplastic: Secondary | ICD-10-CM

## 2020-10-18 DIAGNOSIS — L719 Rosacea, unspecified: Secondary | ICD-10-CM

## 2020-10-18 NOTE — Progress Notes (Signed)
Plan for 3-4 BBL's spaced 6-8 weeks apart. D/C doxycyline 3-4 days prior to tx. No hx of fever blisters. jj

## 2020-10-19 ENCOUNTER — Ambulatory Visit
Admission: RE | Admit: 2020-10-19 | Discharge: 2020-10-19 | Disposition: A | Discharge status: Home / Self Care | Payer: BC Managed Care – PPO | Source: Ambulatory Visit | Attending: Family Medicine | Admitting: Family Medicine

## 2020-10-19 DIAGNOSIS — Z1231 Encounter for screening mammogram for malignant neoplasm of breast: Secondary | ICD-10-CM

## 2020-10-20 ENCOUNTER — Telehealth: Payer: Self-pay

## 2020-10-20 NOTE — Telephone Encounter (Signed)
PA started for Snythroid 50 mcg, tab, name brand Awaiting on determination  Key:  [id number]

## 2020-10-25 ENCOUNTER — Telehealth: Payer: Self-pay

## 2020-10-25 NOTE — Telephone Encounter (Signed)
Good RX  coupon printed for patient, she will come pick it up.

## 2020-10-25 NOTE — Telephone Encounter (Signed)
Copied from [place] [id number]. Topic: General - Other >> Oct 25, 2020 11:44 AM Jodie Echevaria wrote: Reason for CRM: Patient called in to inform Dr Wynetta Emery that her insurance is not allowing the payment on her medication SYNTHROID 50 MCG tablet. Per patient the pharmacy is asking for $200 for the 3 month supply and she cant afford this and she need to be on this brand medication. Asking Dr Wynetta Emery to please do something so she can get her refill Please call  Ph# [phone]

## 2020-10-25 NOTE — Telephone Encounter (Signed)
I'm not sure what she'd like me to do? I can send in a 30 day supply or I can change her not to brand name, but it sounds like she has to have the brand name.

## 2021-02-09 ENCOUNTER — Other Ambulatory Visit: Payer: Self-pay

## 2021-02-09 ENCOUNTER — Encounter: Payer: Self-pay | Admitting: Family Medicine

## 2021-02-09 ENCOUNTER — Other Ambulatory Visit: Payer: Self-pay | Admitting: Internal Medicine

## 2021-02-09 ENCOUNTER — Ambulatory Visit: Payer: BC Managed Care – PPO | Admitting: Family Medicine

## 2021-02-09 VITALS — BP 132/84 | HR 79 | Temp 97.7°F | Wt 189.0 lb

## 2021-02-09 DIAGNOSIS — E559 Vitamin D deficiency, unspecified: Secondary | ICD-10-CM | POA: Diagnosis not present

## 2021-02-09 DIAGNOSIS — F410 Panic disorder [episodic paroxysmal anxiety] without agoraphobia: Secondary | ICD-10-CM

## 2021-02-09 DIAGNOSIS — E039 Hypothyroidism, unspecified: Secondary | ICD-10-CM

## 2021-02-09 DIAGNOSIS — R5382 Chronic fatigue, unspecified: Secondary | ICD-10-CM

## 2021-02-09 MED ORDER — ALBUTEROL SULFATE HFA 108 (90 BASE) MCG/ACT IN AERS: 2.0000 | INHALATION_SPRAY | Freq: Four times a day (QID) | RESPIRATORY_TRACT | 2 refills | Status: DC | PRN

## 2021-02-09 MED ORDER — ALPRAZOLAM 0.25 MG PO TABS
ORAL_TABLET | ORAL | 0 refills | Status: DC
Start: 2021-02-09 — End: 2021-09-13

## 2021-02-09 MED ORDER — MIRTAZAPINE 15 MG PO TABS
ORAL_TABLET | ORAL | 1 refills | Status: DC
Start: 2021-02-09 — End: 2021-09-13

## 2021-02-09 NOTE — Assessment & Plan Note (Signed)
Checking labs today. Await results. Treat as needed.  

## 2021-02-09 NOTE — Progress Notes (Signed)
BP 132/84   Pulse 79   Temp 97.7 F (36.5 C)   Wt 189 lb (85.7 kg)   SpO2 97%   BMI 37.30 kg/m    Subjective:    Patient ID: Sherry Gonzalez, female    DOB: 1971/08/06, 50 y.o.   MRN: [id number]  HPI: Sherry Gonzalez is a 50 y.o. female  Chief Complaint  Patient presents with  . Fatigue    Patient states about 3 weeks ago she started feeling tired. Is concerned about her vit D and thyroid being low    FATIGUE Duration:  About 3 weeks or so Severity: moderate  Onset: sudden Context when symptoms started:  moving  Symptoms improve with rest: yes  Depressive symptoms: no Stress/anxiety: yes Insomnia: no  Snoring: no Observed apnea by bed partner: no Daytime hypersomnolence:no Wakes feeling refreshed: yes History of sleep study: no Dysnea on exertion:  no Orthopnea/PND: no Chest pain: no Chronic cough: no Lower extremity edema: no Arthralgias:no Myalgias: no Weakness: no Rash: no  ANXIETY/STRESS Duration: chronic Status:controlled Anxious mood: yes  Excessive worrying: no Irritability: no  Sweating: no Nausea: no Palpitations:no Hyperventilation: no Panic attacks: no Agoraphobia: no  Obscessions/compulsions: no Depressed mood: yes Depression screen [facility] 2/9 09/10/2020 08/26/2019 04/11/2019 11/18/2018 06/14/2018  Decreased Interest 0 0 0 0 0  Down, Depressed, Hopeless 0 0 0 1 0  PHQ - 2 Score 0 0 0 1 0  Altered sleeping 0 0 1 0 0  Tired, decreased energy 0 0 0 0 1  Change in appetite 0 0 0 1 0  Feeling bad or failure about yourself  0 0 0 0 0  Trouble concentrating 0 0 0 0 0  Moving slowly or fidgety/restless 0 0 0 0 0  Suicidal thoughts 0 0 0 0 0  PHQ-9 Score 0 0 1 2 1   Difficult doing work/chores Not difficult at all Not difficult at all Not difficult at all Not difficult at all Not difficult at all  Some recent data might be hidden   Anhedonia: no Weight changes: no Insomnia: no   Hypersomnia: no Fatigue/loss of energy: yes Feelings of worthlessness:  no Feelings of guilt: no Impaired concentration/indecisiveness: no Suicidal ideations: no  Crying spells: no Recent Stressors/Life Changes: yes   Relationship problems: no   Family stress: no     Financial stress: no    Job stress: no    Recent death/loss: no  HYPOTHYROIDISM Thyroid control status:unsure Satisfied with current treatment? yes Medication side effects: no Medication compliance: excellent compliance Recent dose adjustment:no Fatigue: yes Cold intolerance: no Heat intolerance: no Weight gain: no Weight loss: no Constipation: no Diarrhea/loose stools: no Palpitations: no Lower extremity edema: no Anxiety/depressed mood: no  Relevant past medical, surgical, family and social history reviewed and updated as indicated. Interim medical history since our last visit reviewed. Allergies and medications reviewed and updated.  Review of Systems  Constitutional: Negative.   Respiratory: Negative.   Cardiovascular: Negative.   Musculoskeletal: Negative.   Psychiatric/Behavioral: Negative.     Per HPI unless specifically indicated above     Objective:    BP 132/84   Pulse 79   Temp 97.7 F (36.5 C)   Wt 189 lb (85.7 kg)   SpO2 97%   BMI 37.30 kg/m   Wt Readings from Last 3 Encounters:  02/09/21 189 lb (85.7 kg)  10/11/20 184 lb (83.5 kg)  09/10/20 185 lb 9.6 oz (84.2 kg)    Physical Exam Vitals and nursing  note reviewed.  Constitutional:      General: She is not in acute distress.    Appearance: Normal appearance. She is not ill-appearing, toxic-appearing or diaphoretic.  HENT:     Head: Normocephalic and atraumatic.     Right Ear: External ear normal.     Left Ear: External ear normal.     Nose: Nose normal.     Mouth/Throat:     Mouth: Mucous membranes are moist.     Pharynx: Oropharynx is clear.  Eyes:     General: No scleral icterus.       Right eye: No discharge.        Left eye: No discharge.     Extraocular Movements: Extraocular  movements intact.     Conjunctiva/sclera: Conjunctivae normal.     Pupils: Pupils are equal, round, and reactive to light.  Cardiovascular:     Rate and Rhythm: Normal rate and regular rhythm.     Pulses: Normal pulses.     Heart sounds: Normal heart sounds. No murmur heard. No friction rub. No gallop.   Pulmonary:     Effort: Pulmonary effort is normal. No respiratory distress.     Breath sounds: Normal breath sounds. No stridor. No wheezing, rhonchi or rales.  Chest:     Chest wall: No tenderness.  Musculoskeletal:        General: Normal range of motion.     Cervical back: Normal range of motion and neck supple.  Skin:    General: Skin is warm and dry.     Capillary Refill: Capillary refill takes less than 2 seconds.     Coloration: Skin is not jaundiced or pale.     Findings: No bruising, erythema, lesion or rash.  Neurological:     General: No focal deficit present.     Mental Status: She is alert and oriented to person, place, and time. Mental status is at baseline.  Psychiatric:        Mood and Affect: Mood normal.        Behavior: Behavior normal.        Thought Content: Thought content normal.        Judgment: Judgment normal.     Results for orders placed or performed during the hospital encounter of 10/11/20  Pregnancy, urine POC  Result Value Ref Range   Preg Test, Ur NEGATIVE NEGATIVE      Assessment & Plan:   Problem List Items Addressed This Visit      Endocrine   Hypothyroid    Checking labs today. Await results. Treat as needed.         Other   Anxiety disorder    Under good control on current regimen. Continue current regimen. Continue to monitor. Call with any concerns. Refills given today. 30 pills should last 6+ months.        Relevant Medications   mirtazapine (REMERON) 15 MG tablet   Vitamin D deficiency    Checking labs today. Await results. Treat as needed.        Other Visit Diagnoses    Chronic fatigue    -  Primary   Likely  multifactorial. Will check labs today. Await results. Treat as needed.    Relevant Orders   CBC with Differential/Platelet   Comprehensive metabolic panel   TSH   VITAMIN D 25 Hydroxy (Vit-D Deficiency, Fractures)       Follow up plan: Return in about 7 months (around 09/11/2021) for physical.

## 2021-02-09 NOTE — Assessment & Plan Note (Signed)
Under good control on current regimen. Continue current regimen. Continue to monitor. Call with any concerns. Refills given today. 30 pills should last 6+ months.

## 2021-02-10 LAB — CBC WITH DIFFERENTIAL/PLATELET
Basophils Absolute: 0.1 10*3/uL (ref 0.0–0.2)
Basos: 1 %
EOS (ABSOLUTE): 0.2 10*3/uL (ref 0.0–0.4)
Eos: 4 %
Hematocrit: 39.6 % (ref 34.0–46.6)
Hemoglobin: 13.1 g/dL (ref 11.1–15.9)
Immature Grans (Abs): 0 10*3/uL (ref 0.0–0.1)
Immature Granulocytes: 0 %
Lymphocytes Absolute: 1 10*3/uL (ref 0.7–3.1)
Lymphs: 15 %
MCH: 29.4 pg (ref 26.6–33.0)
MCHC: 33.1 g/dL (ref 31.5–35.7)
MCV: 89 fL (ref 79–97)
Monocytes Absolute: 0.5 10*3/uL (ref 0.1–0.9)
Monocytes: 8 %
Neutrophils Absolute: 4.4 10*3/uL (ref 1.4–7.0)
Neutrophils: 72 %
Platelets: 322 10*3/uL (ref 150–450)
RBC: 4.45 x10E6/uL (ref 3.77–5.28)
RDW: 13.1 % (ref 11.7–15.4)
WBC: 6.2 10*3/uL (ref 3.4–10.8)

## 2021-02-10 LAB — COMPREHENSIVE METABOLIC PANEL
ALT: 15 IU/L (ref 0–32)
AST: 27 IU/L (ref 0–40)
Albumin/Globulin Ratio: 1.4 (ref 1.2–2.2)
Albumin: 4.2 g/dL (ref 3.8–4.8)
Alkaline Phosphatase: 73 IU/L (ref 44–121)
BUN/Creatinine Ratio: 9 (ref 9–23)
BUN: 7 mg/dL (ref 6–24)
Bilirubin Total: 0.8 mg/dL (ref 0.0–1.2)
CO2: 22 mmol/L (ref 20–29)
Calcium: 9 mg/dL (ref 8.7–10.2)
Chloride: 103 mmol/L (ref 96–106)
Creatinine, Ser: 0.76 mg/dL (ref 0.57–1.00)
Globulin, Total: 3.1 g/dL (ref 1.5–4.5)
Glucose: 82 mg/dL (ref 65–99)
Potassium: 4.1 mmol/L (ref 3.5–5.2)
Sodium: 138 mmol/L (ref 134–144)
Total Protein: 7.3 g/dL (ref 6.0–8.5)
eGFR: 97 mL/min/{1.73_m2} (ref >59)

## 2021-02-10 LAB — TSH: TSH: 2.51 u[IU]/mL (ref 0.450–4.500)

## 2021-02-10 LAB — VITAMIN D 25 HYDROXY (VIT D DEFICIENCY, FRACTURES): Vit D, 25-Hydroxy: 25.2 ng/mL — ABNORMAL LOW (ref 30.0–100.0)

## 2021-02-15 ENCOUNTER — Other Ambulatory Visit: Payer: Self-pay

## 2021-02-15 ENCOUNTER — Ambulatory Visit: Payer: BC Managed Care – PPO | Admitting: Dermatology

## 2021-02-15 DIAGNOSIS — L719 Rosacea, unspecified: Secondary | ICD-10-CM | POA: Diagnosis not present

## 2021-02-15 NOTE — Progress Notes (Signed)
   Follow-Up Visit   Subjective  Sherry Gonzalez is a 50 y.o. female who presents for the following: 6 month follow up (Patient is here today for follow up on rosacea. Patient was getting bbl laser treatment to help with redness and did see improvement. Patient is currently not using any creams. ). Has had some increasing redness and bumps.  The following portions of the chart were reviewed this encounter and updated as appropriate:     Objective  Well appearing patient in no apparent distress; mood and affect are within normal limits.  A focused examination was performed including face. Relevant physical exam findings are noted in the Assessment and Plan.  Objective  face: Erythema on cheeks and inflammatory papule on bilateral cheeks   Assessment & Plan  Rosacea face  Some improvement with persistent redness.   Improved with BBL, but some worsening since stopped oral/topical treatment.  Pt will schedule additional bbl treatment with Sonia Baller in [facility] and would like to get added to future wait list for treatment here.    Restart Soolantra Cream qhs face.  Continue doxycycline 80mg  take 1/2 tablet po QD. Has on hand    Continue daily broad spectrum sunscreen SPF 30+ to face, reapply every 2 hours as needed.   Rosacea is a chronic progressive skin condition usually affecting the face of adults, causing redness and/or acne bumps. It is treatable but not curable. It sometimes affects the eyes (ocular rosacea) as well. It may respond to topical and/or systemic medication and can flare with stress, sun exposure, alcohol, exercise and some foods.  Daily application of broad spectrum spf 30+ sunscreen to face is recommended to reduce flares.      Other Related Medications Ivermectin (SOOLANTRA) 1 % CREA  Return in about 6 months (around 08/18/2021) for rosacea follow up.  I, Ruthell Rummage, CMA, am acting as scribe for Brendolyn Patty, MD.  Documentation: I have reviewed the above  documentation for accuracy and completeness, and I agree with the above.  Brendolyn Patty MD

## 2021-02-15 NOTE — Patient Instructions (Addendum)
Rosacea  What is rosacea? Rosacea (say: ro-zay-sha) is a common skin disease that usually begins as a trend of flushing or blushing easily.  As rosacea progresses, a persistent redness in the center of the face will develop and may gradually spread beyond the nose and cheeks to the forehead and chin.  In some cases, the ears, chest, and back could be affected.  Rosacea may appear as tiny blood vessels or small red bumps that occur in crops.  Frequently they can contain pus, and are called "pustules".  If the bumps do not contain pus, they are referred to as "papules".  Rarely, in prolonged, untreated cases of rosacea, the oil glands of the nose and cheeks may become permanently enlarged.  This is called rhinophyma, and is seen more frequently in men.  Signs and Risks In its beginning stages, rosacea tends to come and go, which makes it difficult to recognize.  It can start as intermittent flushing of the face.  Eventually, blood vessels may become permanently visible.  Pustules and papules can appear, but can be mistaken for adult acne.  People of all races, ages, genders and ethnic groups are at risk of developing rosacea.  However, it is more common in women (especially around menopause) and adults with fair skin between the ages of 30 and 50.  Treatment Dermatologists typically recommend a combination of treatments to effectively manage rosacea.  Treatment can improve symptoms and may stop the progression of the rosacea.  Treatment may involve both topical and oral medications.  The tetracycline antibiotics are often used for their anti-inflammatory effect; however, because of the possibility of developing antibiotic resistance, they should not be used long term at full dose.  For dilated blood vessels the options include electrodessication (uses electric current through a small needle), laser treatment, and cosmetics to hide the redness.   With all forms of treatment, improvement is a slow process, and  patients may not see any results for the first 3-4 weeks.  It is very important to avoid the sun and other triggers.  Patients must wear sunscreen daily.  Skin Care Instructions: 1. Cleanse the skin with a mild soap such as CeraVe cleanser, Cetaphil cleanser, or Dove soap once or twice daily as needed. 2. Moisturize with Eucerin Redness Relief Daily Perfecting Lotion (has a subtle green tint), CeraVe Moisturizing Cream, or Oil of Olay Daily Moisturizer with sunscreen every morning and/or night as recommended. 3. Makeup should be "non-comedogenic" (won't clog pores) and be labeled "for sensitive skin". Good choices for cosmetics are: Neutrogena, Almay, and Physician's Formula.  Any product with a green tint tends to offset a red complexion. 4. If your eyes are dry and irritated, use artificial tears 2-3 times per day and cleanse the eyelids daily with baby shampoo.  Have your eyes examined at least every 2 years.  Be sure to tell your eye doctor that you have rosacea. 5. Alcoholic beverages tend to cause flushing of the skin, and may make rosacea worse. 6. Always wear sunscreen, protect your skin from extreme hot and cold temperatures, and avoid spicy foods, hot drinks, and mechanical irritation such as rubbing, scrubbing, or massaging the face.  Avoid harsh skin cleansers, cleansing masks, astringents, and exfoliation. If a particular product burns or makes your face feel tight, then it is likely to flare your rosacea. 7. If you are having difficulty finding a sunscreen that you can tolerate, you may try switching to a chemical-free sunscreen.  These are ones whose active   ingredient is zinc oxide or titanium dioxide only.  They should also be fragrance free, non-comedogenic, and labeled for sensitive skin. 8. Rosacea triggers may vary from person to person.  There are a variety of foods that have been reported to trigger rosacea.  Some patients find that keeping a diary of what they were doing when they  flared helps them avoid triggers.   Restart Soolantra Cream apply to face nightly to help with redness and bumps .   Continue doxycycline 80mg  take 1/2 tablet by mouth daily with food. Use for flares  Continue daily broad spectrum sunscreen SPF 30+ to face, reapply every 2 hours as needed.  Doxycycline should be taken with food to prevent nausea. Do not lay down for 30 minutes after taking. Be cautious with sun exposure and use good sun protection while on this medication. Pregnant women should not take this medication.   If you have any questions or concerns for your doctor, please call our main line at [phone] and press option 4 to reach your doctor's medical assistant. If no one answers, please leave a voicemail as directed and we will return your call as soon as possible. Messages left after 4 pm will be answered the following business day.   You may also send [place] a message via [place]. We typically respond to MyChart messages within 1-2 business days.  For prescription refills, please ask your pharmacy to contact our office. Our fax number is [phone].  If you have an urgent issue when the clinic is closed that cannot wait until the next business day, you can page your doctor at the number below.    Please note that while we do our best to be available for urgent issues outside of office hours, we are not available 24/7.   If you have an urgent issue and are unable to reach [place], you may choose to seek medical care at your doctor's office, retail clinic, urgent care center, or emergency room.  If you have a medical emergency, please immediately call 911 or go to the emergency department.  Pager Numbers  - Dr. Nehemiah Massed: [phone]  - Dr. Laurence Ferrari: [phone]  - Dr. Nicole Kindred: [phone]  In the event of inclement weather, please call our main line at [phone] for an update on the status of any delays or closures.  Dermatology Medication Tips: Please keep the boxes  that topical medications come in in order to help keep track of the instructions about where and how to use these. Pharmacies typically print the medication instructions only on the boxes and not directly on the medication tubes.   If your medication is too expensive, please contact our office at [phone] option 4 or send [place] a message through [place].   We are unable to tell what your co-pay for medications will be in advance as this is different depending on your insurance coverage. However, we may be able to find a substitute medication at lower cost or fill out paperwork to get insurance to cover a needed medication.   If a prior authorization is required to get your medication covered by your insurance company, please allow [place] 1-2 business days to complete this process.  Drug prices often vary depending on where the prescription is filled and some pharmacies may offer cheaper prices.  The website www.goodrx.com contains coupons for medications through different pharmacies. The prices here do not account for what the cost may be with help from insurance (it may be cheaper with your insurance),  but the website can give you the price if you did not use any insurance.  - You can print the associated coupon and take it with your prescription to the pharmacy.  - You may also stop by our office during regular business hours and pick up a GoodRx coupon card.  - If you need your prescription sent electronically to a different pharmacy, notify our office through [facility] or by phone at [phone] option 4.

## 2021-03-11 ENCOUNTER — Ambulatory Visit: Payer: BC Managed Care – PPO | Admitting: Family Medicine

## 2021-04-27 ENCOUNTER — Other Ambulatory Visit: Payer: Self-pay | Admitting: Family Medicine

## 2021-04-27 NOTE — Telephone Encounter (Signed)
Requested medication (s) are due for refill today: no  Requested medication (s) are on the active medication list: yes   Last refill: 12/26/2020  Future visit scheduled: yes  Notes to clinic:  this refill cannot be delegated     Requested Prescriptions  Pending Prescriptions Disp Refills   ALPRAZolam (XANAX) 0.25 MG tablet [Pharmacy Med Name: ALPRAZOLAM 0.25MG  TABLETS] 30 tablet     Sig: TAKE 1/2 TABLET BY MOUTH DAILY AT NIGHT AS NEEDED FOR ANXIETY OR SLEEP      Not Delegated - Psychiatry:  Anxiolytics/Hypnotics Failed - 04/27/2021  8:42 AM      Failed - This refill cannot be delegated      Failed - Urine Drug Screen completed in last 360 days      Passed - Valid encounter within last 6 months    Recent Outpatient Visits           2 months ago Chronic fatigue   [place], Megan P, DO   7 months ago Routine general medical examination at a health care facility   [place], [place] P, DO   1 year ago Panic disorder without agoraphobia   [facility] [place], [place], DO   1 year ago Routine general medical examination at a health care facility   [place], [place], DO   1 year ago Dermatitis   [place] Venita Lick, NP       Future Appointments             In 3 months Brendolyn Patty, MD [place]   In 4 months Wynetta Emery, Barb Merino, DO [facility], [place]

## 2021-04-27 NOTE — Telephone Encounter (Signed)
Scheduled 10/26

## 2021-05-02 NOTE — Telephone Encounter (Signed)
Patient will need an appointment. - per Dr. Wynetta Emery

## 2021-05-02 NOTE — Telephone Encounter (Signed)
Called pt she states that she does not need any right now she had just clicked to have back up to make sure they don't run out

## 2021-07-26 ENCOUNTER — Other Ambulatory Visit: Payer: Self-pay | Admitting: Family Medicine

## 2021-07-27 NOTE — Telephone Encounter (Signed)
Requested Prescriptions  Pending Prescriptions Disp Refills  . SYNTHROID 50 MCG tablet [Pharmacy Med Name: SYNTHROID 0.'05MG'$  (50MCG)TABLETS] 90 tablet 1    Sig: TAKE 1 TABLET(50 MCG) BY MOUTH DAILY BEFORE BREAKFAST     Endocrinology:  Hypothyroid Agents Failed - 07/26/2021 11:33 AM      Failed - TSH needs to be rechecked within 3 months after an abnormal result. Refill until TSH is due.      Passed - TSH in normal range and within 360 days    TSH  Date Value Ref Range Status  02/09/2021 2.510 0.450 - 4.500 uIU/mL Final         Passed - Valid encounter within last 12 months    Recent Outpatient Visits          5 months ago Chronic fatigue   [place], Megan P, DO   10 months ago Routine general medical examination at a health care facility   [facility], [place], DO   1 year ago Panic disorder without agoraphobia   [place], Megan P, DO   1 year ago Routine general medical examination at a health care facility   [place], [place], DO   2 years ago Dermatitis   [place], Jolene T, NP      Future Appointments            In 1 week Brendolyn Patty, MD [place]   In 1 month [place], Barb Merino, DO [facility]

## 2021-08-08 ENCOUNTER — Ambulatory Visit: Payer: BC Managed Care – PPO | Admitting: Dermatology

## 2021-08-08 ENCOUNTER — Other Ambulatory Visit: Payer: Self-pay

## 2021-08-08 DIAGNOSIS — L719 Rosacea, unspecified: Secondary | ICD-10-CM

## 2021-08-08 DIAGNOSIS — L219 Seborrheic dermatitis, unspecified: Secondary | ICD-10-CM

## 2021-08-08 DIAGNOSIS — D2239 Melanocytic nevi of other parts of face: Secondary | ICD-10-CM | POA: Diagnosis not present

## 2021-08-08 MED ORDER — CLOBETASOL PROPIONATE 0.05 % EX SHAM: MEDICATED_SHAMPOO | CUTANEOUS | 3 refills | Status: DC

## 2021-08-08 MED ORDER — IVERMECTIN 1 % EX CREA: TOPICAL_CREAM | CUTANEOUS | 11 refills | Status: DC

## 2021-08-08 MED ORDER — DOXYCYCLINE HYCLATE 80 MG PO TBEC
1.0000 | DELAYED_RELEASE_TABLET | Freq: Every day | ORAL | 3 refills | Status: DC
Start: 2021-08-08 — End: 2022-03-07

## 2021-08-08 NOTE — Patient Instructions (Addendum)
Rosacea is a chronic progressive skin condition usually affecting the face of adults, causing redness and/or acne bumps. It is treatable but not curable. It sometimes affects the eyes (ocular rosacea) as well. It may respond to topical and/or systemic medication and can flare with stress, sun exposure, alcohol, exercise and some foods.  Daily application of broad spectrum spf 30+ sunscreen to face is recommended to reduce flares.  Seborrheic Dermatitis  -  is a chronic persistent rash characterized by pinkness and scaling most commonly of the mid face but also can occur on the scalp (dandruff), ears; mid chest, mid back and groin.  It tends to be exacerbated by stress and cooler weather.  People who have neurologic disease may experience new onset or exacerbation of existing seborrheic dermatitis.  The condition is not curable but treatable and can be controlled.  Doxycycline '80mg'$  take 1/2 pill daily for rosacea, may increase to 1 pill ('80mg'$ ) with flares.  Doxycycline should be taken with food to prevent nausea. Do not lay down for 30 minutes after taking. Be cautious with sun exposure and use good sun protection while on this medication. Pregnant women should not take this medication.    If you have any questions or concerns for your doctor, please call our main line at [phone] and press option 4 to reach your doctor's medical assistant. If no one answers, please leave a voicemail as directed and we will return your call as soon as possible. Messages left after 4 pm will be answered the following business day.   You may also send [place] a message via [place]. We typically respond to MyChart messages within 1-2 business days.  For prescription refills, please ask your pharmacy to contact our office. Our fax number is [phone].  If you have an urgent issue when the clinic is closed that cannot wait until the next business day, you can page your doctor at the number below.    Please note that  while we do our best to be available for urgent issues outside of office hours, we are not available 24/7.   If you have an urgent issue and are unable to reach [place], you may choose to seek medical care at your doctor's office, retail clinic, urgent care center, or emergency room.  If you have a medical emergency, please immediately call 911 or go to the emergency department.  Pager Numbers  - Dr. Nehemiah Massed: [phone]  - Dr. Laurence Ferrari: [phone]  - Dr. Nicole Kindred: [phone]  In the event of inclement weather, please call our main line at [phone] for an update on the status of any delays or closures.  Dermatology Medication Tips: Please keep the boxes that topical medications come in in order to help keep track of the instructions about where and how to use these. Pharmacies typically print the medication instructions only on the boxes and not directly on the medication tubes.   If your medication is too expensive, please contact our office at [phone] option 4 or send [place] a message through [place].   We are unable to tell what your co-pay for medications will be in advance as this is different depending on your insurance coverage. However, we may be able to find a substitute medication at lower cost or fill out paperwork to get insurance to cover a needed medication.   If a prior authorization is required to get your medication covered by your insurance company, please allow [place] 1-2 business days to complete this process.  Drug prices  often vary depending on where the prescription is filled and some pharmacies may offer cheaper prices.  The website www.goodrx.com contains coupons for medications through different pharmacies. The prices here do not account for what the cost may be with help from insurance (it may be cheaper with your insurance), but the website can give you the price if you did not use any insurance.  - You can print the associated coupon and take it with your  prescription to the pharmacy.  - You may also stop by our office during regular business hours and pick up a GoodRx coupon card.  - If you need your prescription sent electronically to a different pharmacy, notify our office through [facility] or by phone at [phone] option 4.

## 2021-08-08 NOTE — Progress Notes (Signed)
Follow-Up Visit   Subjective  Sherry Gonzalez is a 50 y.o. female who presents for the following: Rosacea (Patient here today for 6 month rosacea follow up. Patient is currently using Soolantra but did not restart doxycycline. She has mainly redness, no bumps. Patient has had 4 BBL treatments with Lind Covert this year. ). Patient also has flaky, itchy scalp. She has used ciclopirox shampoo in the past with no improvement.  Patient has not had any problems with bumps, only the redness. Patient advises she has had improvement, especially around her neck.   The following portions of the chart were reviewed this encounter and updated as appropriate:       Review of Systems:  No other skin or systemic complaints except as noted in HPI or Assessment and Plan.  Objective  Well appearing patient in no apparent distress; mood and affect are within normal limits.  A focused examination was performed including face, neck. Relevant physical exam findings are noted in the Assessment and Plan.  face Mild erythema of the cheeks, chin, forehead, nose; small telangiectasias of the malar cheeks  Scalp Mild erythema and scaling of the scalp. Still itches   Assessment & Plan   Melanocytic Nevi - Flesh colored papules of the right chin, right cheek, and left paranasal - Benign appearing on exam today - Observation - Call clinic for new or changing moles - Recommend daily use of broad spectrum spf 30+ sunscreen to sun-exposed areas.  - Discussed shave removal if become irritated.   Rosacea face  Improving, decreaseed redness with BBLs x 4 and topical Soolantra  Rosacea is a chronic progressive skin condition usually affecting the face of adults, causing redness and/or acne bumps. It is treatable but not curable. It sometimes affects the eyes (ocular rosacea) as well. It may respond to topical and/or systemic medication and can flare with stress, sun exposure, alcohol, exercise and some foods.   Daily application of broad spectrum spf 30+ sunscreen to face is recommended to reduce flares.    Continue Soolantra Cream Apply to face qhs 1 yr Rf. Continue Doxycycline 80 mg PO, take 1/2 tab PO qd, 1 tab qd prn flares  Patient has 1 more BBL treatment with Sonia Baller next month.  Ivermectin (SOOLANTRA) 1 % CREA - face Apply to face at bedtime.  Seborrheic dermatitis Scalp  Seborrheic Dermatitis- no improvement -  is a chronic persistent rash characterized by pinkness and scaling most commonly of the mid face but also can occur on the scalp (dandruff), ears; mid chest, mid back and groin.  It tends to be exacerbated by stress and cooler weather.  People who have neurologic disease may experience new onset or exacerbation of existing seborrheic dermatitis.  The condition is not curable but treatable and can be controlled.  Start Clobetasol shampoo Apply to dry scalp every other day, let sit for 15 minutes, then massage and rinse in shower. When improved, decrease to once weekly. 3Rf Continue OTC medicated shampoo with Zinc for maintenance    Clobetasol Propionate 0.05 % shampoo - Scalp Apply to dry scalp every other day, let sit for 15 minutes, then massage and rinse in shower. When improved, decrease to once weekly.  Related Medications mometasone (ELOCON) 0.1 % lotion Apply to affected areas scalp 1-2 times daily as needed.  Return in about 4 months (around 12/08/2021) for f/u seb derm and possible mole removal.  I, Jamesetta Orleans, CMA, am acting as scribe for Brendolyn Patty, MD . Documentation: I  have reviewed the above documentation for accuracy and completeness, and I agree with the above.  Brendolyn Patty MD

## 2021-09-13 ENCOUNTER — Ambulatory Visit (INDEPENDENT_AMBULATORY_CARE_PROVIDER_SITE_OTHER): Payer: BC Managed Care – PPO | Admitting: Family Medicine

## 2021-09-13 ENCOUNTER — Encounter: Payer: Self-pay | Admitting: Family Medicine

## 2021-09-13 ENCOUNTER — Other Ambulatory Visit: Payer: Self-pay

## 2021-09-13 VITALS — BP 115/80 | HR 74 | Ht 59.0 in | Wt 191.0 lb

## 2021-09-13 DIAGNOSIS — Z136 Encounter for screening for cardiovascular disorders: Secondary | ICD-10-CM | POA: Diagnosis not present

## 2021-09-13 DIAGNOSIS — Z0000 Encounter for general adult medical examination without abnormal findings: Secondary | ICD-10-CM | POA: Diagnosis not present

## 2021-09-13 DIAGNOSIS — E039 Hypothyroidism, unspecified: Secondary | ICD-10-CM

## 2021-09-13 DIAGNOSIS — Z23 Encounter for immunization: Secondary | ICD-10-CM

## 2021-09-13 DIAGNOSIS — Z1231 Encounter for screening mammogram for malignant neoplasm of breast: Secondary | ICD-10-CM | POA: Diagnosis not present

## 2021-09-13 DIAGNOSIS — F410 Panic disorder [episodic paroxysmal anxiety] without agoraphobia: Secondary | ICD-10-CM

## 2021-09-13 DIAGNOSIS — E559 Vitamin D deficiency, unspecified: Secondary | ICD-10-CM | POA: Diagnosis not present

## 2021-09-13 LAB — URINALYSIS, ROUTINE W REFLEX MICROSCOPIC
Bilirubin, UA: NEGATIVE
Glucose, UA: NEGATIVE
Ketones, UA: NEGATIVE
Leukocytes,UA: NEGATIVE
Nitrite, UA: NEGATIVE
Protein,UA: NEGATIVE
RBC, UA: NEGATIVE
Specific Gravity, UA: 1.01 (ref 1.005–1.030)
Urobilinogen, Ur: 0.2 mg/dL (ref 0.2–1.0)
pH, UA: 6 (ref 5.0–7.5)

## 2021-09-13 MED ORDER — ALPRAZOLAM 0.25 MG PO TABS
ORAL_TABLET | ORAL | 0 refills | Status: DC
Start: 2021-09-13 — End: 2022-03-07

## 2021-09-13 MED ORDER — ALBUTEROL SULFATE HFA 108 (90 BASE) MCG/ACT IN AERS: 2.0000 | INHALATION_SPRAY | Freq: Four times a day (QID) | RESPIRATORY_TRACT | 2 refills | Status: DC | PRN

## 2021-09-13 MED ORDER — MIRTAZAPINE 15 MG PO TABS
ORAL_TABLET | ORAL | 1 refills | Status: DC
Start: 2021-09-13 — End: 2022-03-07

## 2021-09-13 NOTE — Assessment & Plan Note (Signed)
Labs drawn today. Await results.  

## 2021-09-13 NOTE — Assessment & Plan Note (Signed)
Under good control on current regimen. Continue current regimen. Continue to monitor. Call with any concerns. Refills given. Call with any concerns.  

## 2021-09-13 NOTE — Assessment & Plan Note (Signed)
Labs drawn today. Await results. Treat as needed.  

## 2021-09-13 NOTE — Progress Notes (Signed)
BP 115/80   Pulse 74   Ht 4\' 11"  (1.499 m)   Wt 191 lb (86.6 kg)   BMI 38.58 kg/m    Subjective:    Patient ID: Sherry Gonzalez, female    DOB: 04-09-1971, 50 y.o.   MRN: [id number]  HPI: Sherry Gonzalez is a 50 y.o. female presenting on 09/13/2021 for comprehensive medical examination. Current medical complaints include:  HYPOTHYROIDISM Thyroid control status:controlled Satisfied with current treatment? yes Medication side effects: no Medication compliance: excellent compliance Recent dose adjustment:no Fatigue: no Cold intolerance: no Heat intolerance: no Weight gain: no Weight loss: no Constipation: no Diarrhea/loose stools: no Palpitations: no Lower extremity edema: no Anxiety/depressed mood: yes  ANXIETY/STRESS Duration: chronic Status:stable Anxious mood: yes  Excessive worrying: yes Irritability: no  Sweating: no Nausea: no Palpitations:no Hyperventilation: no Panic attacks: no Agoraphobia: no  Obscessions/compulsions: no Depressed mood: no Depression screen [facility] 2/9 09/13/2021 09/10/2020 08/26/2019 04/11/2019 11/18/2018  Decreased Interest 0 0 0 0 0  Down, Depressed, Hopeless 0 0 0 0 1  PHQ - 2 Score 0 0 0 0 1  Altered sleeping 0 0 0 1 0  Tired, decreased energy 0 0 0 0 0  Change in appetite 0 0 0 0 1  Feeling bad or failure about yourself  0 0 0 0 0  Trouble concentrating 0 0 0 0 0  Moving slowly or fidgety/restless 0 0 0 0 0  Suicidal thoughts 0 0 0 0 0  PHQ-9 Score 0 0 0 1 2  Difficult doing work/chores - Not difficult at all Not difficult at all Not difficult at all Not difficult at all  Some recent data might be hidden   Anhedonia: no Weight changes: no Insomnia: no   Hypersomnia: no Fatigue/loss of energy: no Feelings of worthlessness: no Feelings of guilt: no Impaired concentration/indecisiveness: no Suicidal ideations: no  Crying spells: no Recent Stressors/Life Changes: no   Relationship problems: no   Family stress: no     Financial  stress: no    Job stress: no    Recent death/loss: no  Menopausal Symptoms: yes- menses starting to become more irregular  Depression Screen done today and results listed below:  Depression screen [facility] 2/9 09/13/2021 09/10/2020 08/26/2019 04/11/2019 11/18/2018  Decreased Interest 0 0 0 0 0  Down, Depressed, Hopeless 0 0 0 0 1  PHQ - 2 Score 0 0 0 0 1  Altered sleeping 0 0 0 1 0  Tired, decreased energy 0 0 0 0 0  Change in appetite 0 0 0 0 1  Feeling bad or failure about yourself  0 0 0 0 0  Trouble concentrating 0 0 0 0 0  Moving slowly or fidgety/restless 0 0 0 0 0  Suicidal thoughts 0 0 0 0 0  PHQ-9 Score 0 0 0 1 2  Difficult doing work/chores - Not difficult at all Not difficult at all Not difficult at all Not difficult at all  Some recent data might be hidden    Past Medical History:  Past Medical History:  Diagnosis Date   Allergy-induced asthma    Carpal tunnel syndrome, left    left upper limbt   Carpal tunnel syndrome, right    right upper limb   Eustachian tube disorder    left ear   Hand eczema    Hypothyroidism    Mild intermittent asthma    Trigger ring finger of left hand     Surgical History:  Past Surgical History:  Procedure Laterality Date   COLONOSCOPY WITH PROPOFOL N/A 10/11/2020   Procedure: COLONOSCOPY WITH PROPOFOL;  Surgeon: Lucilla Lame, MD;  Location: [facility];  Service: Endoscopy;  Laterality: N/A;   WISDOM TOOTH EXTRACTION      Medications:  Current Outpatient Medications on File Prior to Visit  Medication Sig   acetaminophen (TYLENOL) 325 MG tablet Take 325 mg by mouth as needed.   Clobetasol Propionate 0.05 % shampoo Apply to dry scalp every other day, let sit for 15 minutes, then massage and rinse in shower. When improved, decrease to once weekly.   Doxycycline Hyclate 80 MG TBEC Take 1 tablet by mouth daily.   Ivermectin (SOOLANTRA) 1 % CREA Apply to face at bedtime.   KETOCONAZOLE, TOPICAL, 1 % SHAM Apply 1 application  topically 2 (two) times a week.   Loratadine (CLARITIN PO) Take 10 mg by mouth daily.    mometasone (ELOCON) 0.1 % lotion Apply to affected areas scalp 1-2 times daily as needed.   SF 1.1 % GEL dental gel BRUSH ON TEETH AT BEDTIME AND DO NOT RINSE   SYNTHROID 50 MCG tablet TAKE 1 TABLET(50 MCG) BY MOUTH DAILY BEFORE BREAKFAST   triamcinolone cream (KENALOG) 0.1 % Apply 1 application topically 2 (two) times daily.   VITAMIN D PO Take by mouth.   No current facility-administered medications on file prior to visit.    Allergies:  No Known Allergies  Social History:  Social History   Socioeconomic History   Marital status: Single    Spouse name: Not on file   Number of children: Not on file   Years of education: Not on file   Highest education level: Not on file  Occupational History   Not on file  Tobacco Use   Smoking status: Never   Smokeless tobacco: Never  Vaping Use   Vaping Use: Never used  Substance and Sexual Activity   Alcohol use: No    Alcohol/week: 0.0 standard drinks   Drug use: No   Sexual activity: Never  Other Topics Concern   Not on file  Social History Narrative   Not on file   Social Determinants of Health   Financial Resource Strain: Not on file  Food Insecurity: Not on file  Transportation Needs: Not on file  Physical Activity: Not on file  Stress: Not on file  Social Connections: Not on file  Intimate Partner Violence: Not on file   Social History   Tobacco Use  Smoking Status Never  Smokeless Tobacco Never   Social History   Substance and Sexual Activity  Alcohol Use No   Alcohol/week: 0.0 standard drinks    Family History:  Family History  Problem Relation Age of Onset   Alcohol abuse Father    Ehlers-Danlos syndrome Mother        Mothers side of the family   Migraines Sister    Breast cancer Neg Hx     Past medical history, surgical history, medications, allergies, family history and social history reviewed with patient  today and changes made to appropriate areas of the chart.   Review of Systems  Constitutional: Negative.   HENT: Negative.    Eyes: Negative.   Respiratory: Negative.    Cardiovascular: Negative.   Gastrointestinal:  Positive for heartburn (with food choices). Negative for abdominal pain, blood in stool, constipation, diarrhea, melena, nausea and vomiting.  Genitourinary: Negative.   Musculoskeletal: Negative.   Skin: Negative.   Neurological: Negative.   Endo/Heme/Allergies: Negative.  Psychiatric/Behavioral:  Negative for depression, hallucinations, memory loss, substance abuse and suicidal ideas. The patient is nervous/anxious. The patient does not have insomnia.   All other ROS negative except what is listed above and in the HPI.      Objective:    BP 115/80   Pulse 74   Ht 4\' 11"  (1.499 m)   Wt 191 lb (86.6 kg)   BMI 38.58 kg/m   Wt Readings from Last 3 Encounters:  09/13/21 191 lb (86.6 kg)  02/09/21 189 lb (85.7 kg)  10/11/20 184 lb (83.5 kg)    Physical Exam Vitals and nursing note reviewed.  Constitutional:      General: She is not in acute distress.    Appearance: Normal appearance. She is not ill-appearing, toxic-appearing or diaphoretic.  HENT:     Head: Normocephalic and atraumatic.     Right Ear: Tympanic membrane, ear canal and external ear normal. There is no impacted cerumen.     Left Ear: Tympanic membrane, ear canal and external ear normal. There is no impacted cerumen.     Nose: Nose normal. No congestion or rhinorrhea.     Mouth/Throat:     Mouth: Mucous membranes are moist.     Pharynx: Oropharynx is clear. No oropharyngeal exudate or posterior oropharyngeal erythema.  Eyes:     General: No scleral icterus.       Right eye: No discharge.        Left eye: No discharge.     Extraocular Movements: Extraocular movements intact.     Conjunctiva/sclera: Conjunctivae normal.     Pupils: Pupils are equal, round, and reactive to light.  Neck:      Vascular: No carotid bruit.  Cardiovascular:     Rate and Rhythm: Normal rate and regular rhythm.     Pulses: Normal pulses.     Heart sounds: No murmur heard.   No friction rub. No gallop.  Pulmonary:     Effort: Pulmonary effort is normal. No respiratory distress.     Breath sounds: Normal breath sounds. No stridor. No wheezing, rhonchi or rales.  Chest:     Chest wall: No tenderness.  Abdominal:     General: Abdomen is flat. Bowel sounds are normal. There is no distension.     Palpations: Abdomen is soft. There is no mass.     Tenderness: There is no abdominal tenderness. There is no right CVA tenderness, left CVA tenderness, guarding or rebound.     Hernia: No hernia is present.  Genitourinary:    Comments: Breast and pelvic exams deferred with shared decision making Musculoskeletal:        General: No swelling, tenderness, deformity or signs of injury.     Cervical back: Normal range of motion and neck supple. No rigidity. No muscular tenderness.     Right lower leg: No edema.     Left lower leg: No edema.  Lymphadenopathy:     Cervical: No cervical adenopathy.  Skin:    General: Skin is warm and dry.     Capillary Refill: Capillary refill takes less than 2 seconds.     Coloration: Skin is not jaundiced or pale.     Findings: No bruising, erythema, lesion or rash.  Neurological:     General: No focal deficit present.     Mental Status: She is alert and oriented to person, place, and time. Mental status is at baseline.     Cranial Nerves: No cranial nerve deficit.  Sensory: No sensory deficit.     Motor: No weakness.     Coordination: Coordination normal.     Gait: Gait normal.     Deep Tendon Reflexes: Reflexes normal.  Psychiatric:        Mood and Affect: Mood normal.        Behavior: Behavior normal.        Thought Content: Thought content normal.        Judgment: Judgment normal.    Results for orders placed or performed in visit on 09/13/21  Urinalysis,  Routine w reflex microscopic  Result Value Ref Range   Specific Gravity, UA 1.010 1.005 - 1.030   pH, UA 6.0 5.0 - 7.5   Color, UA Yellow Yellow   Appearance Ur Clear Clear   Leukocytes,UA Negative Negative   Protein,UA Negative Negative/Trace   Glucose, UA Negative Negative   Ketones, UA Negative Negative   RBC, UA Negative Negative   Bilirubin, UA Negative Negative   Urobilinogen, Ur 0.2 0.2 - 1.0 mg/dL   Nitrite, UA Negative Negative      Assessment & Plan:   Problem List Items Addressed This Visit       Endocrine   Hypothyroid    Labs drawn today. Await results. Treat as needed.         Other   Anxiety disorder    Under good control on current regimen. Continue current regimen. Continue to monitor. Call with any concerns. Refills given. Call with any concerns.        Relevant Medications   ALPRAZolam (XANAX) 0.25 MG tablet   mirtazapine (REMERON) 15 MG tablet   Vitamin D deficiency    Labs drawn today. Await results.       Relevant Orders   Vitamin D (25 hydroxy)   Other Visit Diagnoses     Routine general medical examination at a health care facility    -  Primary   Vaccines up to date. Screening labs checked today. Pap and colonoscopy up to date. Mammogram ordered. Continue diet and exercise. Call with any concerns.    Relevant Orders   CBC with Differential/Platelet   Comprehensive metabolic panel   TSH   Urinalysis, Routine w reflex microscopic (Completed)   Encounter for screening for cardiovascular disorders       Labs drawn today. Await results.    Relevant Orders   Lipid panel   Need for immunization against influenza       Flu shot given today.   Relevant Orders   Flu Vaccine QUAD 46mo+IM (Fluarix, Fluzone & Alfiuria Quad PF) (Completed)   Encounter for screening mammogram for malignant neoplasm of breast       Mammogram ordered today.   Relevant Orders   MM DIAG BREAST TOMO BILATERAL        Follow up plan: Return in about 6 months  (around 03/14/2022).   LABORATORY TESTING:  - Pap smear: up to date  IMMUNIZATIONS:   - Tdap: Tetanus vaccination status reviewed: last tetanus booster within 10 years. - Influenza: Administered today - Pneumovax: Not applicable - Prevnar: Not applicable - COVID: Up to date - Shringrix vaccine: Not applicable  SCREENING: -Mammogram: Ordered today  - Colonoscopy: Up to date   PATIENT COUNSELING:   Advised to take 1 mg of folate supplement per day if capable of pregnancy.   Sexuality: Discussed sexually transmitted diseases, partner selection, use of condoms, avoidance of unintended pregnancy  and contraceptive alternatives.   Advised to avoid  cigarette smoking.  I discussed with the patient that most people either abstain from alcohol or drink within safe limits (<=14/week and <=4 drinks/occasion for males, <=7/weeks and <= 3 drinks/occasion for females) and that the risk for alcohol disorders and other health effects rises proportionally with the number of drinks per week and how often a drinker exceeds daily limits.  Discussed cessation/primary prevention of drug use and availability of treatment for abuse.   Diet: Encouraged to adjust caloric intake to maintain  or achieve ideal body weight, to reduce intake of dietary saturated fat and total fat, to limit sodium intake by avoiding high sodium foods and not adding table salt, and to maintain adequate dietary potassium and calcium preferably from fresh fruits, vegetables, and low-fat dairy products.    stressed the importance of regular exercise  Injury prevention: Discussed safety belts, safety helmets, smoke detector, smoking near bedding or upholstery.   Dental health: Discussed importance of regular tooth brushing, flossing, and dental visits.    NEXT PREVENTATIVE PHYSICAL DUE IN 1 YEAR. Return in about 6 months (around 03/14/2022).

## 2021-09-14 ENCOUNTER — Encounter: Payer: BC Managed Care – PPO | Admitting: Family Medicine

## 2021-09-14 LAB — CBC WITH DIFFERENTIAL/PLATELET
Basophils Absolute: 0 10*3/uL (ref 0.0–0.2)
Basos: 1 %
EOS (ABSOLUTE): 0.5 10*3/uL — ABNORMAL HIGH (ref 0.0–0.4)
Eos: 7 %
Hematocrit: 40.3 % (ref 34.0–46.6)
Hemoglobin: 13.2 g/dL (ref 11.1–15.9)
Immature Grans (Abs): 0 10*3/uL (ref 0.0–0.1)
Immature Granulocytes: 0 %
Lymphocytes Absolute: 1.1 10*3/uL (ref 0.7–3.1)
Lymphs: 16 %
MCH: 30.1 pg (ref 26.6–33.0)
MCHC: 32.8 g/dL (ref 31.5–35.7)
MCV: 92 fL (ref 79–97)
Monocytes Absolute: 0.6 10*3/uL (ref 0.1–0.9)
Monocytes: 9 %
Neutrophils Absolute: 4.6 10*3/uL (ref 1.4–7.0)
Neutrophils: 67 %
Platelets: 309 10*3/uL (ref 150–450)
RBC: 4.39 x10E6/uL (ref 3.77–5.28)
RDW: 12.8 % (ref 11.7–15.4)
WBC: 6.8 10*3/uL (ref 3.4–10.8)

## 2021-09-14 LAB — COMPREHENSIVE METABOLIC PANEL
ALT: 9 IU/L (ref 0–32)
AST: 16 IU/L (ref 0–40)
Albumin/Globulin Ratio: 1.9 (ref 1.2–2.2)
Albumin: 4.3 g/dL (ref 3.8–4.8)
Alkaline Phosphatase: 65 IU/L (ref 44–121)
BUN/Creatinine Ratio: 13 (ref 9–23)
BUN: 12 mg/dL (ref 6–24)
Bilirubin Total: 1 mg/dL (ref 0.0–1.2)
CO2: 22 mmol/L (ref 20–29)
Calcium: 9 mg/dL (ref 8.7–10.2)
Chloride: 102 mmol/L (ref 96–106)
Creatinine, Ser: 0.91 mg/dL (ref 0.57–1.00)
Globulin, Total: 2.3 g/dL (ref 1.5–4.5)
Glucose: 87 mg/dL (ref 70–99)
Potassium: 4.2 mmol/L (ref 3.5–5.2)
Sodium: 138 mmol/L (ref 134–144)
Total Protein: 6.6 g/dL (ref 6.0–8.5)
eGFR: 78 mL/min/{1.73_m2} (ref >59)

## 2021-09-14 LAB — TSH: TSH: 4.18 u[IU]/mL (ref 0.450–4.500)

## 2021-09-14 LAB — LIPID PANEL
Chol/HDL Ratio: 3.2 ratio (ref 0.0–4.4)
Cholesterol, Total: 146 mg/dL (ref 100–199)
HDL: 46 mg/dL (ref >39)
LDL Chol Calc (NIH): 79 mg/dL (ref 0–99)
Triglycerides: 114 mg/dL (ref 0–149)
VLDL Cholesterol Cal: 21 mg/dL (ref 5–40)

## 2021-09-14 LAB — VITAMIN D 25 HYDROXY (VIT D DEFICIENCY, FRACTURES): Vit D, 25-Hydroxy: 27.1 ng/mL — ABNORMAL LOW (ref 30.0–100.0)

## 2021-11-03 ENCOUNTER — Other Ambulatory Visit: Payer: Self-pay | Admitting: Family Medicine

## 2021-11-03 DIAGNOSIS — Z1231 Encounter for screening mammogram for malignant neoplasm of breast: Secondary | ICD-10-CM

## 2021-11-08 ENCOUNTER — Other Ambulatory Visit: Payer: Self-pay

## 2021-11-08 ENCOUNTER — Ambulatory Visit
Admission: RE | Admit: 2021-11-08 | Discharge: 2021-11-08 | Disposition: A | Discharge status: Home / Self Care | Payer: BC Managed Care – PPO | Source: Ambulatory Visit | Attending: Family Medicine | Admitting: Family Medicine

## 2021-11-08 DIAGNOSIS — Z1231 Encounter for screening mammogram for malignant neoplasm of breast: Secondary | ICD-10-CM | POA: Diagnosis not present

## 2021-12-12 ENCOUNTER — Ambulatory Visit: Payer: BC Managed Care – PPO | Admitting: Dermatology

## 2021-12-12 ENCOUNTER — Other Ambulatory Visit: Payer: Self-pay

## 2021-12-12 DIAGNOSIS — L719 Rosacea, unspecified: Secondary | ICD-10-CM

## 2021-12-12 DIAGNOSIS — L219 Seborrheic dermatitis, unspecified: Secondary | ICD-10-CM | POA: Diagnosis not present

## 2021-12-12 MED ORDER — CLOBETASOL PROPIONATE 0.05 % EX SHAM: MEDICATED_SHAMPOO | CUTANEOUS | 3 refills | Status: DC

## 2021-12-12 NOTE — Patient Instructions (Addendum)
Continue clobetasol shampoo apply two times per week, massage into scalp and leave in for 5 minutes before rinsing out. Avoid applying to face, groin, and axilla. Use as directed. Long-term use can cause thinning of the skin.  Topical steroids (such as triamcinolone, fluocinolone, fluocinonide, mometasone, clobetasol, halobetasol, betamethasone, hydrocortisone) can cause thinning and lightening of the skin if they are used for too long in the same area. Your physician has selected the right strength medicine for your problem and area affected on the body. Please use your medication only as directed by your physician to prevent side effects.   Seborrheic Dermatitis  -  is a chronic persistent rash characterized by pinkness and scaling most commonly of the mid face but also can occur on the scalp (dandruff), ears; mid chest, mid back and groin.  It tends to be exacerbated by stress and cooler weather.  People who have neurologic disease may experience new onset or exacerbation of existing seborrheic dermatitis.  The condition is not curable but treatable and can be controlled.  Rosacea is a chronic progressive skin condition usually affecting the face of adults, causing redness and/or acne bumps. It is treatable but not curable. It sometimes affects the eyes (ocular rosacea) as well. It may respond to topical and/or systemic medication and can flare with stress, sun exposure, alcohol, exercise and some foods.  Daily application of broad spectrum spf 30+ sunscreen to face is recommended to reduce flares.  If You Need Anything After Your Visit  If you have any questions or concerns for your doctor, please call our main line at [phone] and press option 4 to reach your doctor's medical assistant. If no one answers, please leave a voicemail as directed and we will return your call as soon as possible. Messages left after 4 pm will be answered the following business day.   You may also send [place] a message  via [place]. We typically respond to MyChart messages within 1-2 business days.  For prescription refills, please ask your pharmacy to contact our office. Our fax number is [phone].  If you have an urgent issue when the clinic is closed that cannot wait until the next business day, you can page your doctor at the number below.    Please note that while we do our best to be available for urgent issues outside of office hours, we are not available 24/7.   If you have an urgent issue and are unable to reach [place], you may choose to seek medical care at your doctor's office, retail clinic, urgent care center, or emergency room.  If you have a medical emergency, please immediately call 911 or go to the emergency department.  Pager Numbers  - Dr. Nehemiah Massed: [phone]  - Dr. Laurence Ferrari: [phone]  - Dr. Nicole Kindred: [phone]  In the event of inclement weather, please call our main line at [phone] for an update on the status of any delays or closures.  Dermatology Medication Tips: Please keep the boxes that topical medications come in in order to help keep track of the instructions about where and how to use these. Pharmacies typically print the medication instructions only on the boxes and not directly on the medication tubes.   If your medication is too expensive, please contact our office at [phone] option 4 or send [place] a message through [place].   We are unable to tell what your co-pay for medications will be in advance as this is different depending on your insurance coverage. However, we may  be able to find a substitute medication at lower cost or fill out paperwork to get insurance to cover a needed medication.   If a prior authorization is required to get your medication covered by your insurance company, please allow [place] 1-2 business days to complete this process.  Drug prices often vary depending on where the prescription is filled and some pharmacies may offer cheaper  prices.  The website www.goodrx.com contains coupons for medications through different pharmacies. The prices here do not account for what the cost may be with help from insurance (it may be cheaper with your insurance), but the website can give you the price if you did not use any insurance.  - You can print the associated coupon and take it with your prescription to the pharmacy.  - You may also stop by our office during regular business hours and pick up a GoodRx coupon card.  - If you need your prescription sent electronically to a different pharmacy, notify our office through [facility] or by phone at [phone] option 4.     Si Usted Necesita Algo Despus de Su Visita  Tambin puede enviarnos un mensaje a travs de Pharmacist, community. Por lo general respondemos a los mensajes de MyChart en el transcurso de 1 a 2 das hbiles.  Para renovar recetas, por favor pida a su farmacia que se ponga en contacto con nuestra oficina. Harland Dingwall de fax es [place] [phone].  Si tiene un asunto urgente cuando la clnica est cerrada y que no puede esperar hasta el siguiente da hbil, puede llamar/localizar a su doctor(a) al nmero que aparece a continuacin.   Por favor, tenga en cuenta que aunque hacemos todo lo posible para estar disponibles para asuntos urgentes fuera del horario de [place], no estamos disponibles las 24 horas del da, los 7 das de la [place].   Si tiene un problema urgente y no puede comunicarse con nosotros, puede optar por buscar atencin mdica  en el consultorio de su doctor(a), en una clnica privada, en un centro de atencin urgente o en una sala de emergencias.  Si tiene Engineering geologist, por favor llame inmediatamente al 911 o vaya a la sala de emergencias.  Nmeros de bper  - Dr. Nehemiah Massed: [phone]  - Dra. Moye: [phone]  - Dra. Nicole Kindred: [phone]  En caso de inclemencias del [place], por favor llame a Johnsie Kindred principal al [phone]  para una actualizacin sobre el [place] de cualquier retraso o cierre.  Consejos para la medicacin en dermatologa: Por favor, guarde las cajas en las que vienen los medicamentos de uso tpico para ayudarle a seguir las instrucciones sobre dnde y cmo usarlos. Las farmacias generalmente imprimen las instrucciones del medicamento slo en las cajas y no directamente en los tubos del [place].   Si su medicamento es muy caro, por favor, pngase en contacto con Zigmund Daniel llamando al [phone] y presione la opcin 4 o envenos un mensaje a travs de Pharmacist, community.   No podemos decirle cul ser su copago por los medicamentos por adelantado ya que esto es diferente dependiendo de la cobertura de su seguro. Sin embargo, es posible que podamos encontrar un medicamento sustituto a Electrical engineer un formulario para que el seguro cubra el medicamento que se considera necesario.   Si se requiere una autorizacin previa para que su compaa de seguros [place] su medicamento, por favor permtanos de 1 a 2 das hbiles para completar este proceso.  Los precios de los medicamentos varan  con frecuencia dependiendo del lugar de dnde se surte la receta y alguna farmacias pueden ofrecer precios ms baratos.  El sitio web www.goodrx.com tiene cupones para medicamentos de Airline pilot. Los precios aqu no tienen en cuenta lo que podra costar con la ayuda del seguro (puede ser ms barato con su seguro), pero el sitio web puede darle el precio si no utiliz Research scientist (physical sciences).  - Puede imprimir el cupn correspondiente y llevarlo con su receta a la farmacia.  - Tambin puede pasar por nuestra oficina durante el horario de atencin regular y Charity fundraiser una tarjeta de cupones de GoodRx.  - Si necesita que su receta se enve electrnicamente a una farmacia diferente, informe a nuestra oficina a travs de MyChart de [place] o por telfono llamando al [phone] y presione la opcin 4.

## 2021-12-12 NOTE — Progress Notes (Signed)
° °  Follow-Up Visit   Subjective  Sherry Gonzalez is a 51 y.o. female who presents for the following: Rosacea (Patient has had 5 BBL treatments in [place] and had her last one this past Friday. She does have some swelling and said that her treatment was a little more aggressive. Patient using Soolantra at bedtime and daily sunscreen.) and Follow-up (Patient following up for seb derm at the scalp. She is currently using clobetasol shampoo twice weekly, she is not using mometasone. Patient advises she is not having any itching. ).   The following portions of the chart were reviewed this encounter and updated as appropriate:       Review of Systems:  No other skin or systemic complaints except as noted in HPI or Assessment and Plan.  Objective  Well appearing patient in no apparent distress; mood and affect are within normal limits.  A focused examination was performed including face, scalp. Relevant physical exam findings are noted in the Assessment and Plan.  face Erythema at cheeks, chin, minimal at forehead  Scalp Mild scale     Assessment & Plan  Rosacea face  Increased redness from recent laser procedure with Lind Covert, still healing  Rosacea is a chronic progressive skin condition usually affecting the face of adults, causing redness and/or acne bumps. It is treatable but not curable. It sometimes affects the eyes (ocular rosacea) as well. It may respond to topical and/or systemic medication and can flare with stress, sun exposure, alcohol, exercise and some foods.  Daily application of broad spectrum spf 30+ sunscreen to face is recommended to reduce flares.  Continue Soolantra nightly Continue daily sunscreen  Related Medications Ivermectin (SOOLANTRA) 1 % CREA Apply to face at bedtime.  Seborrheic dermatitis Scalp  Controlled with treatment  Continue clobetasol shampoo apply two times per week, massage into scalp and leave in for 5 minutes before rinsing out.  Avoid applying to face, groin, and axilla. Use as directed. Long-term use can cause thinning of the skin.  Topical steroids (such as triamcinolone, fluocinolone, fluocinonide, mometasone, clobetasol, halobetasol, betamethasone, hydrocortisone) can cause thinning and lightening of the skin if they are used for too long in the same area. Your physician has selected the right strength medicine for your problem and area affected on the body. Please use your medication only as directed by your physician to prevent side effects.     Related Medications mometasone (ELOCON) 0.1 % lotion Apply to affected areas scalp 1-2 times daily as needed.  Clobetasol Propionate 0.05 % shampoo Apply to dry scalp twice weekly, let sit for 5 minutes, then massage and rinse in shower. When improved, decrease to once weekly.   Return in about 6 months (around 06/11/2022) for Rosacea, Seb Derm.  Graciella Belton, RMA, am acting as scribe for Brendolyn Patty, MD .  Documentation: I have reviewed the above documentation for accuracy and completeness, and I agree with the above.  Brendolyn Patty MD

## 2022-01-21 ENCOUNTER — Other Ambulatory Visit: Payer: Self-pay | Admitting: Family Medicine

## 2022-01-22 ENCOUNTER — Other Ambulatory Visit: Payer: Self-pay | Admitting: Family Medicine

## 2022-01-23 NOTE — Telephone Encounter (Signed)
Requested Prescriptions  ?Pending Prescriptions Disp Refills  ?? SYNTHROID 50 MCG tablet [Pharmacy Med Name: SYNTHROID 0.'05MG'$  (50MCG)TABLETS] 90 tablet 1  ?  Sig: TAKE 1 TABLET(50 MCG) BY MOUTH DAILY BEFORE BREAKFAST  ?  ? Endocrinology:  Hypothyroid Agents Passed - 01/21/2022 12:42 PM  ?  ?  Passed - TSH in normal range and within 360 days  ?  TSH  ?Date Value Ref Range Status  ?09/13/2021 4.180 0.450 - 4.500 uIU/mL Final  ?   ?  ?  Passed - Valid encounter within last 12 months  ?  Recent Outpatient Visits   ?      ? 4 months ago Routine general medical examination at a health care facility  ? [place], Megan P, DO  ? 11 months ago Chronic fatigue  ? [place], DO  ? 1 year ago Routine general medical examination at a health care facility  ? [place], [place] P, DO  ? 1 year ago Panic disorder without agoraphobia  ? [place], DO  ? 2 years ago Routine general medical examination at a health care facility  ? [place], [place] P, DO  ?  ?  ?Future Appointments   ?        ? In 1 month Wynetta Emery, Barb Merino, DO [facility]  ? In 4 months Brendolyn Patty, MD [place]  ?  ? ?  ?  ?  ? ?

## 2022-01-23 NOTE — Telephone Encounter (Signed)
Requested Prescriptions  ?Pending Prescriptions Disp Refills  ?? albuterol (VENTOLIN HFA) 108 (90 Base) MCG/ACT inhaler [Pharmacy Med Name: ALBUTEROL HFA INH (200 PUFFS)8.5GM] 8.5 g 2  ?  Sig: INHALE 2 PUFFS INTO THE LUNGS EVERY 6 HOURS AS NEEDED FOR WHEEZING OR SHORTNESS OF BREATH  ?  ? Pulmonology:  Beta Agonists 2 Passed - 01/22/2022  9:57 AM  ?  ?  Passed - Last BP in normal range  ?  BP Readings from Last 1 Encounters:  ?09/13/21 115/80  ?   ?  ?  Passed - Last Heart Rate in normal range  ?  Pulse Readings from Last 1 Encounters:  ?09/13/21 74  ?   ?  ?  Passed - Valid encounter within last 12 months  ?  Recent Outpatient Visits   ?      ? 4 months ago Routine general medical examination at a health care facility  ? [place], Megan P, DO  ? 11 months ago Chronic fatigue  ? [place], DO  ? 1 year ago Routine general medical examination at a health care facility  ? [place], [place] P, DO  ? 1 year ago Panic disorder without agoraphobia  ? [place], DO  ? 2 years ago Routine general medical examination at a health care facility  ? [place], [place] P, DO  ?  ?  ?Future Appointments   ?        ? In 1 month Wynetta Emery, Barb Merino, DO [employer] [facility]  ? In 4 months Brendolyn Patty, MD [place]  ?  ? ?  ?  ?  ? ? ?

## 2022-03-07 ENCOUNTER — Ambulatory Visit: Payer: BC Managed Care – PPO | Admitting: Family Medicine

## 2022-03-07 ENCOUNTER — Encounter: Payer: Self-pay | Admitting: Family Medicine

## 2022-03-07 VITALS — BP 117/76 | HR 111 | Temp 98.6°F | Wt 195.0 lb

## 2022-03-07 DIAGNOSIS — B372 Candidiasis of skin and nail: Secondary | ICD-10-CM

## 2022-03-07 DIAGNOSIS — E559 Vitamin D deficiency, unspecified: Secondary | ICD-10-CM

## 2022-03-07 DIAGNOSIS — F410 Panic disorder [episodic paroxysmal anxiety] without agoraphobia: Secondary | ICD-10-CM | POA: Diagnosis not present

## 2022-03-07 DIAGNOSIS — J069 Acute upper respiratory infection, unspecified: Secondary | ICD-10-CM

## 2022-03-07 DIAGNOSIS — E039 Hypothyroidism, unspecified: Secondary | ICD-10-CM

## 2022-03-07 MED ORDER — CLOTRIMAZOLE-BETAMETHASONE 1-0.05 % EX CREA: 1.0000 "application " | TOPICAL_CREAM | Freq: Every day | CUTANEOUS | 0 refills | Status: DC

## 2022-03-07 MED ORDER — MIRTAZAPINE 15 MG PO TABS: ORAL_TABLET | ORAL | 1 refills | Status: DC

## 2022-03-07 MED ORDER — ALBUTEROL SULFATE HFA 108 (90 BASE) MCG/ACT IN AERS
2.0000 | INHALATION_SPRAY | Freq: Four times a day (QID) | RESPIRATORY_TRACT | 6 refills | Status: DC | PRN
Start: 2022-03-07 — End: 2022-09-19

## 2022-03-07 MED ORDER — ALPRAZOLAM 0.25 MG PO TABS: ORAL_TABLET | ORAL | 0 refills | Status: DC

## 2022-03-07 MED ORDER — PREDNISONE 20 MG PO TABS: 40.0000 mg | ORAL_TABLET | Freq: Every day | ORAL | 0 refills | Status: AC

## 2022-03-07 NOTE — Assessment & Plan Note (Signed)
Rechecking labs today. Await results. Treat as needed.  °

## 2022-03-07 NOTE — Assessment & Plan Note (Signed)
Under good control on current regimen. Continue current regimen. Continue to monitor. Call with any concerns. Refills given.   

## 2022-03-07 NOTE — Progress Notes (Signed)
? ?BP 117/76   Pulse (!) 111   Temp 98.6 ?F (37 ?C)   Wt 195 lb (88.5 kg)   SpO2 98%   BMI 39.39 kg/m?   ? ?Subjective:  ? ? Patient ID: Sherry Gonzalez, female    DOB: Apr 09, 1971, 51 y.o.   MRN: [id number] ? ?HPI: ?Sherry Gonzalez is a 51 y.o. female ? ?Chief Complaint  ?Patient presents with  ? Hypothyroidism  ? Anxiety  ? Cough  ?  Patient states she had a sinus infection and since then has a cough. Patient states her allergies have been triggering a cough.   ? skin concern  ?  Patient states she has been using powder on her skin for about a month. Patient has noticed red spots on her lower abdomen area.   ? ?ANXIETY/STRESS ?Duration:controlled ?Anxious mood: yes  ?Excessive worrying: yes ?Irritability: no  ?Sweating: no ?Nausea: no ?Palpitations:no ?Hyperventilation: no ?Panic attacks: no ?Agoraphobia: no  ?Obscessions/compulsions: no ?Depressed mood: no ? ?  09/13/2021  ?  8:18 AM 09/10/2020  ?  8:01 AM 08/26/2019  ?  8:37 AM 04/11/2019  ?  7:54 AM 11/18/2018  ?  8:37 AM  ?Depression screen PHQ 2/9  ?Decreased Interest 0 0 0 0 0  ?Down, Depressed, Hopeless 0 0 0 0 1  ?PHQ - 2 Score 0 0 0 0 1  ?Altered sleeping 0 0 0 1 0  ?Tired, decreased energy 0 0 0 0 0  ?Change in appetite 0 0 0 0 1  ?Feeling bad or failure about yourself  0 0 0 0 0  ?Trouble concentrating 0 0 0 0 0  ?Moving slowly or fidgety/restless 0 0 0 0 0  ?Suicidal thoughts 0 0 0 0 0  ?PHQ-9 Score 0 0 0 1 2  ?Difficult doing work/chores  Not difficult at all Not difficult at all Not difficult at all Not difficult at all  ? ?Anhedonia: no ?Weight changes: no ?Insomnia: no   ?Hypersomnia: no ?Fatigue/loss of energy: no ?Feelings of worthlessness: no ?Feelings of guilt: no ?Impaired concentration/indecisiveness: no ?Suicidal ideations: no  ?Crying spells: no ?Recent Stressors/Life Changes: no ?  Relationship problems: no ?  Family stress: no   ?  Financial stress: no  ?  Job stress: no  ?  Recent death/loss: no ? ?HYPOTHYROIDISM ?Thyroid control  status:controlled ?Satisfied with current treatment? yes ?Medication side effects: no ?Medication compliance: excellent compliance ?Recent dose adjustment:no ?Fatigue: yes ?Cold intolerance: no ?Heat intolerance: no ?Weight gain: no ?Weight loss: no ?Constipation: no ?Diarrhea/loose stools: no ?Palpitations: no ?Lower extremity edema: no ?Anxiety/depressed mood: yes ? ?COUGH ?Duration: 5-6 weeks ?Circumstances of initial development of cough: URI ?Cough severity: moderate ?Cough description: non-productive and hacking ?Aggravating factors:  worse with mowing grass, eating, coughing ?Alleviating factors: relaxing ?Status:  better ?Treatments attempted: albuterol ?Wheezing: yes ?Shortness of breath: yes ?Chest pain: no ?Chest tightness:no ?Nasal congestion: no ?Runny nose: no ?Postnasal drip: no ?Frequent throat clearing or swallowing: no ?Hemoptysis: no ?Fevers: no ?Night sweats: no ?Weight loss: no ?Heartburn: no ?Recent foreign travel: no ?Tuberculosis contacts: no ? ?RASH ?Duration:  weeks  ?Location: under fold of her belly  ?Itching: no ?Burning: yes ?Redness: yes ?Oozing: yes ?Scaling: yes ?Blisters: yes ?Painful: no ?Fevers: no ?Change in detergents/soaps/personal care products: no ?Recent illness: no ?Recent travel:no ?History of same: no ?Context: worse ?Alleviating factors: nothing ?Treatments attempted:OTC anit-fungal ?Shortness of breath: no  ?Throat/tongue swelling: no ?Myalgias/arthralgias: no ? ? ?Relevant past medical, surgical, family and social  history reviewed and updated as indicated. Interim medical history since our last visit reviewed. ?Allergies and medications reviewed and updated. ? ?Review of Systems  ?Constitutional: Negative.   ?HENT: Negative.    ?Respiratory:  Positive for cough, shortness of breath and wheezing. Negative for apnea, choking, chest tightness and stridor.   ?Cardiovascular: Negative.   ?Gastrointestinal: Negative.   ?Musculoskeletal: Negative.   ?Skin:  Positive for rash.  Negative for color change, pallor and wound.  ?Psychiatric/Behavioral:  Positive for dysphoric mood. Negative for agitation, behavioral problems, confusion, decreased concentration, hallucinations, self-injury, sleep disturbance and suicidal ideas. The patient is nervous/anxious. The patient is not hyperactive.   ? ?Per HPI unless specifically indicated above ? ?   ?Objective:  ?  ?BP 117/76   Pulse (!) 111   Temp 98.6 ?F (37 ?C)   Wt 195 lb (88.5 kg)   SpO2 98%   BMI 39.39 kg/m?   ?Wt Readings from Last 3 Encounters:  ?03/07/22 195 lb (88.5 kg)  ?09/13/21 191 lb (86.6 kg)  ?02/09/21 189 lb (85.7 kg)  ?  ?Physical Exam ?Vitals and nursing note reviewed.  ?Constitutional:   ?   General: She is not in acute distress. ?   Appearance: Normal appearance. She is not ill-appearing, toxic-appearing or diaphoretic.  ?HENT:  ?   Head: Normocephalic and atraumatic.  ?   Right Ear: External ear normal.  ?   Left Ear: External ear normal.  ?   Nose: Nose normal.  ?   Mouth/Throat:  ?   Mouth: Mucous membranes are moist.  ?   Pharynx: Oropharynx is clear.  ?Eyes:  ?   General: No scleral icterus.    ?   Right eye: No discharge.     ?   Left eye: No discharge.  ?   Extraocular Movements: Extraocular movements intact.  ?   Conjunctiva/sclera: Conjunctivae normal.  ?   Pupils: Pupils are equal, round, and reactive to light.  ?Cardiovascular:  ?   Rate and Rhythm: Normal rate and regular rhythm.  ?   Pulses: Normal pulses.  ?   Heart sounds: Normal heart sounds. No murmur heard. ?  No friction rub. No gallop.  ?Pulmonary:  ?   Effort: Pulmonary effort is normal. No respiratory distress.  ?   Breath sounds: No stridor. Wheezing present. No rhonchi or rales.  ?Chest:  ?   Chest wall: No tenderness.  ?Musculoskeletal:     ?   General: Normal range of motion.  ?   Cervical back: Normal range of motion and neck supple.  ?Skin: ?   General: Skin is warm and dry.  ?   Capillary Refill: Capillary refill takes less than 2 seconds.  ?    Coloration: Skin is not jaundiced or pale.  ?   Findings: Erythema (under belly fold) present. No bruising, lesion or rash.  ?Neurological:  ?   General: No focal deficit present.  ?   Mental Status: She is alert and oriented to person, place, and time. Mental status is at baseline.  ?Psychiatric:     ?   Mood and Affect: Mood normal.     ?   Behavior: Behavior normal.     ?   Thought Content: Thought content normal.     ?   Judgment: Judgment normal.  ? ? ?Results for orders placed or performed in visit on 09/13/21  ?CBC with Differential/Platelet  ?Result Value Ref Range  ? WBC 6.8 3.4 - 10.8  x10E3/uL  ? RBC 4.39 3.77 - 5.28 x10E6/uL  ? Hemoglobin 13.2 11.1 - 15.9 g/dL  ? Hematocrit 40.3 34.0 - 46.6 %  ? MCV 92 79 - 97 fL  ? MCH 30.1 26.6 - 33.0 pg  ? MCHC 32.8 31.5 - 35.7 g/dL  ? RDW 12.8 11.7 - 15.4 %  ? Platelets 309 150 - 450 x10E3/uL  ? Neutrophils 67 Not Estab. %  ? Lymphs 16 Not Estab. %  ? Monocytes 9 Not Estab. %  ? Eos 7 Not Estab. %  ? Basos 1 Not Estab. %  ? Neutrophils Absolute 4.6 1.4 - 7.0 x10E3/uL  ? Lymphocytes Absolute 1.1 0.7 - 3.1 x10E3/uL  ? Monocytes Absolute 0.6 0.1 - 0.9 x10E3/uL  ? EOS (ABSOLUTE) 0.5 (H) 0.0 - 0.4 x10E3/uL  ? Basophils Absolute 0.0 0.0 - 0.2 x10E3/uL  ? Immature Granulocytes 0 Not Estab. %  ? Immature Grans (Abs) 0.0 0.0 - 0.1 x10E3/uL  ?Comprehensive metabolic panel  ?Result Value Ref Range  ? Glucose 87 70 - 99 mg/dL  ? BUN 12 6 - 24 mg/dL  ? Creatinine, Ser 0.91 0.57 - 1.00 mg/dL  ? eGFR 78 >59 mL/min/1.73  ? BUN/Creatinine Ratio 13 9 - 23  ? Sodium 138 134 - 144 mmol/L  ? Potassium 4.2 3.5 - 5.2 mmol/L  ? Chloride 102 96 - 106 mmol/L  ? CO2 22 20 - 29 mmol/L  ? Calcium 9.0 8.7 - 10.2 mg/dL  ? Total Protein 6.6 6.0 - 8.5 g/dL  ? Albumin 4.3 3.8 - 4.8 g/dL  ? Globulin, Total 2.3 1.5 - 4.5 g/dL  ? Albumin/Globulin Ratio 1.9 1.2 - 2.2  ? Bilirubin Total 1.0 0.0 - 1.2 mg/dL  ? Alkaline Phosphatase 65 44 - 121 IU/L  ? AST 16 0 - 40 IU/L  ? ALT 9 0 - 32 IU/L  ?Lipid panel   ?Result Value Ref Range  ? Cholesterol, Total 146 100 - 199 mg/dL  ? Triglycerides 114 0 - 149 mg/dL  ? HDL 46 >39 mg/dL  ? VLDL Cholesterol Cal 21 5 - 40 mg/dL  ? LDL Chol Calc (NIH) 79 0 - 99 mg/dL  ? Chol/HDL Ratio 3.2 0.0 -

## 2022-03-08 ENCOUNTER — Ambulatory Visit: Payer: BC Managed Care – PPO | Admitting: Family Medicine

## 2022-03-08 LAB — TSH: TSH: 4.93 u[IU]/mL — ABNORMAL HIGH (ref 0.450–4.500)

## 2022-03-08 LAB — VITAMIN D 25 HYDROXY (VIT D DEFICIENCY, FRACTURES): Vit D, 25-Hydroxy: 22.3 ng/mL — ABNORMAL LOW (ref 30.0–100.0)

## 2022-03-10 ENCOUNTER — Other Ambulatory Visit: Payer: Self-pay | Admitting: Family Medicine

## 2022-03-10 MED ORDER — LEVOTHYROXINE SODIUM 75 MCG PO TABS: ORAL_TABLET | ORAL | 1 refills | Status: DC

## 2022-03-10 NOTE — Telephone Encounter (Signed)
Refilled 03/10/2022 #30 1 refill. ?Requested Prescriptions  ?Pending Prescriptions Disp Refills  ?? levothyroxine (SYNTHROID) 75 MCG tablet [Pharmacy Med Name: LEVOTHYROXINE 0.'075MG'$  (75MCG) TABS] 90 tablet   ?  Sig: TAKE 1 TABLET(75 MCG) BY MOUTH DAILY BEFORE BREAKFAST  ?  ? Endocrinology:  Hypothyroid Agents Failed - 03/10/2022  5:17 PM  ?  ?  Failed - TSH in normal range and within 360 days  ?  TSH  ?Date Value Ref Range Status  ?03/07/2022 4.930 (H) 0.450 - 4.500 uIU/mL Final  ?   ?  ?  Passed - Valid encounter within last 12 months  ?  Recent Outpatient Visits   ?      ? 3 days ago Panic disorder without agoraphobia  ? [place], DO  ? 5 months ago Routine general medical examination at a health care facility  ? [place], Megan P, DO  ? 1 year ago Chronic fatigue  ? [place], DO  ? 1 year ago Routine general medical examination at a health care facility  ? [place], [place] P, DO  ? 2 years ago Panic disorder without agoraphobia  ? [place], [place] P, DO  ?  ?  ?Future Appointments   ?        ? In 1 week Wynetta Emery, Barb Merino, DO [facility]  ? In 3 months Brendolyn Patty, MD [place]  ?  ? ?  ?  ?  ? ?

## 2022-03-13 ENCOUNTER — Ambulatory Visit: Payer: BC Managed Care – PPO | Admitting: Family Medicine

## 2022-03-22 ENCOUNTER — Ambulatory Visit: Payer: BC Managed Care – PPO | Admitting: Family Medicine

## 2022-03-22 ENCOUNTER — Encounter: Payer: Self-pay | Admitting: Family Medicine

## 2022-03-22 VITALS — BP 117/76 | HR 74 | Temp 98.2°F | Wt 192.4 lb

## 2022-03-22 DIAGNOSIS — J069 Acute upper respiratory infection, unspecified: Secondary | ICD-10-CM | POA: Diagnosis not present

## 2022-03-22 DIAGNOSIS — B372 Candidiasis of skin and nail: Secondary | ICD-10-CM | POA: Diagnosis not present

## 2022-03-22 DIAGNOSIS — E039 Hypothyroidism, unspecified: Secondary | ICD-10-CM | POA: Diagnosis not present

## 2022-03-22 MED ORDER — FLUCONAZOLE 150 MG PO TABS: 150.0000 mg | ORAL_TABLET | Freq: Every day | ORAL | 2 refills | Status: DC

## 2022-03-22 NOTE — Progress Notes (Signed)
? ?BP 117/76   Pulse 74   Temp 98.2 ?F (36.8 ?C)   Wt 192 lb 6.4 oz (87.3 kg)   SpO2 99%   BMI 38.86 kg/m?   ? ?Subjective:  ? ? Patient ID: Sherry Gonzalez, female    DOB: 1971/05/29, 51 y.o.   MRN: [id number] ? ?HPI: ?Sherry Gonzalez is a 51 y.o. female ? ?Chief Complaint  ?Patient presents with  ? Cough  ?  Patient is here to follow up on cough, states she is feeling better and is no longer coughing   ? ?Cough is gone. No wheezing. No fevers. Feeling more like herself. No more symptoms.  ? ?RASH ?Duration:  months  ?Location: stomach folds  ?Itching: no ?Burning: no ?Redness: yes ?Oozing: no ?Scaling: no ?Blisters: no ?Painful: no ?Fevers: no ?Change in detergents/soaps/personal care products: no ?Recent illness: no ?Recent travel:no ?History of same: no ?Context: better ?Alleviating factors: lotion/moisturizer ?Treatments attempted:lotion/moisturizer ?Shortness of breath: no  ?Throat/tongue swelling: no ?Myalgias/arthralgias: no ? ?Relevant past medical, surgical, family and social history reviewed and updated as indicated. Interim medical history since our last visit reviewed. ?Allergies and medications reviewed and updated. ? ?Review of Systems  ?Constitutional: Negative.   ?Respiratory: Negative.    ?Cardiovascular: Negative.   ?Gastrointestinal: Negative.   ?Musculoskeletal: Negative.   ?Psychiatric/Behavioral: Negative.    ? ?Per HPI unless specifically indicated above ? ?   ?Objective:  ?  ?BP 117/76   Pulse 74   Temp 98.2 ?F (36.8 ?C)   Wt 192 lb 6.4 oz (87.3 kg)   SpO2 99%   BMI 38.86 kg/m?   ?Wt Readings from Last 3 Encounters:  ?03/22/22 192 lb 6.4 oz (87.3 kg)  ?03/07/22 195 lb (88.5 kg)  ?09/13/21 191 lb (86.6 kg)  ?  ?Physical Exam ?Vitals and nursing note reviewed.  ?Constitutional:   ?   General: She is not in acute distress. ?   Appearance: Normal appearance. She is not ill-appearing, toxic-appearing or diaphoretic.  ?HENT:  ?   Head: Normocephalic and atraumatic.  ?   Right Ear: External ear  normal.  ?   Left Ear: External ear normal.  ?   Nose: Nose normal.  ?   Mouth/Throat:  ?   Mouth: Mucous membranes are moist.  ?   Pharynx: Oropharynx is clear.  ?Eyes:  ?   General: No scleral icterus.    ?   Right eye: No discharge.     ?   Left eye: No discharge.  ?   Extraocular Movements: Extraocular movements intact.  ?   Conjunctiva/sclera: Conjunctivae normal.  ?   Pupils: Pupils are equal, round, and reactive to light.  ?Cardiovascular:  ?   Rate and Rhythm: Normal rate and regular rhythm.  ?   Pulses: Normal pulses.  ?   Heart sounds: Normal heart sounds. No murmur heard. ?  No friction rub. No gallop.  ?Pulmonary:  ?   Effort: Pulmonary effort is normal. No respiratory distress.  ?   Breath sounds: Normal breath sounds. No stridor. No wheezing, rhonchi or rales.  ?Chest:  ?   Chest wall: No tenderness.  ?Musculoskeletal:     ?   General: Normal range of motion.  ?   Cervical back: Normal range of motion and neck supple.  ?Skin: ?   General: Skin is warm and dry.  ?   Capillary Refill: Capillary refill takes less than 2 seconds.  ?   Coloration: Skin is not jaundiced or  pale.  ?   Findings: Erythema present. No bruising, lesion or rash.  ?Neurological:  ?   General: No focal deficit present.  ?   Mental Status: She is alert and oriented to person, place, and time. Mental status is at baseline.  ?Psychiatric:     ?   Mood and Affect: Mood normal.     ?   Behavior: Behavior normal.     ?   Thought Content: Thought content normal.     ?   Judgment: Judgment normal.  ? ? ?Results for orders placed or performed in visit on 03/07/22  ?TSH  ?Result Value Ref Range  ? TSH 4.930 (H) 0.450 - 4.500 uIU/mL  ?VITAMIN D 25 Hydroxy (Vit-D Deficiency, Fractures)  ?Result Value Ref Range  ? Vit D, 25-Hydroxy 22.3 (L) 30.0 - 100.0 ng/mL  ? ?   ?Assessment & Plan:  ? ?Problem List Items Addressed This Visit   ? ?  ? Endocrine  ? Hypothyroid  ?  Due for a recheck in about a month. Order in.  ? ?  ?  ? Relevant Orders  ? TSH   ? ?Other Visit Diagnoses   ? ? Upper respiratory tract infection, unspecified type    -  Primary  ? Mild obstruction on spiro. Lungs clear. Feeling better. Call with any concerns. Continue albuterol PRN.  ? Relevant Medications  ? fluconazole (DIFLUCAN) 150 MG tablet  ? Other Relevant Orders  ? Spirometry with graph (Completed)  ? Candidal intertrigo      ? Better, but not quite there. Will treat with diflucan. Call if not getting better or getting worse.   ? Relevant Medications  ? fluconazole (DIFLUCAN) 150 MG tablet  ? ?  ?  ? ?Follow up plan: ?Return in about 6 months (around 09/22/2022) for physical. ? ? ? ? ? ?

## 2022-03-22 NOTE — Assessment & Plan Note (Signed)
Due for a recheck in about a month. Order in.  ?

## 2022-04-03 ENCOUNTER — Telehealth: Payer: Self-pay

## 2022-04-03 NOTE — Telephone Encounter (Signed)
Patient left VM asking if you would write her a letter of medical necessity of her BBL treatments. She is asking for this information regarding her [employer] account. Even though she had the treatments done in [facility], she would need the letter from you regarding her rosacea. Is this something we could draft up for her? ?

## 2022-04-04 NOTE — Telephone Encounter (Signed)
Letter drafted and placed on Dr. Les Pou desk to review.  ?

## 2022-04-04 NOTE — Telephone Encounter (Signed)
Called patient and let her know that the letter from Dr. Nicole Kindred was ready, she preferred an email. Email sent and scanned in under media tab.  ?

## 2022-04-20 ENCOUNTER — Encounter: Payer: Self-pay | Admitting: Family Medicine

## 2022-04-20 ENCOUNTER — Other Ambulatory Visit: Payer: Self-pay | Admitting: Family Medicine

## 2022-04-20 MED ORDER — BUDESONIDE-FORMOTEROL FUMARATE 160-4.5 MCG/ACT IN AERO: 2.0000 | INHALATION_SPRAY | Freq: Two times a day (BID) | RESPIRATORY_TRACT | 3 refills | Status: DC

## 2022-04-20 NOTE — Progress Notes (Signed)
Symbicort 

## 2022-05-01 ENCOUNTER — Encounter: Payer: Self-pay | Admitting: Family Medicine

## 2022-05-01 ENCOUNTER — Other Ambulatory Visit: Payer: BC Managed Care – PPO

## 2022-05-01 DIAGNOSIS — E039 Hypothyroidism, unspecified: Secondary | ICD-10-CM | POA: Diagnosis not present

## 2022-05-02 ENCOUNTER — Other Ambulatory Visit: Payer: Self-pay | Admitting: Family Medicine

## 2022-05-02 LAB — TSH: TSH: 2.45 u[IU]/mL (ref 0.450–4.500)

## 2022-05-02 MED ORDER — CLOTRIMAZOLE-BETAMETHASONE 1-0.05 % EX CREA
1.0000 | TOPICAL_CREAM | Freq: Every day | CUTANEOUS | 3 refills | Status: DC
Start: 2022-05-02 — End: 2023-10-05

## 2022-05-02 MED ORDER — LEVOTHYROXINE SODIUM 75 MCG PO TABS: ORAL_TABLET | ORAL | 3 refills | Status: DC

## 2022-06-12 ENCOUNTER — Ambulatory Visit: Payer: BC Managed Care – PPO | Admitting: Dermatology

## 2022-06-12 DIAGNOSIS — L219 Seborrheic dermatitis, unspecified: Secondary | ICD-10-CM

## 2022-06-12 DIAGNOSIS — L719 Rosacea, unspecified: Secondary | ICD-10-CM

## 2022-06-12 DIAGNOSIS — L304 Erythema intertrigo: Secondary | ICD-10-CM | POA: Diagnosis not present

## 2022-06-12 MED ORDER — IVERMECTIN 1 % EX CREA: TOPICAL_CREAM | CUTANEOUS | 11 refills | Status: DC

## 2022-06-12 MED ORDER — MOMETASONE FUROATE 0.1 % EX SOLN: CUTANEOUS | 3 refills | Status: DC

## 2022-06-12 MED ORDER — KETOCONAZOLE 2 % EX CREA: TOPICAL_CREAM | CUTANEOUS | 5 refills | Status: DC

## 2022-06-12 MED ORDER — CLOBETASOL PROPIONATE 0.05 % EX SHAM: MEDICATED_SHAMPOO | CUTANEOUS | 3 refills | Status: DC

## 2022-06-12 NOTE — Patient Instructions (Addendum)
Intertrigo is a chronic recurrent rash that occurs in skin fold areas that may be associated with friction; heat; moisture; yeast; fungus; and bacteria.  It is exacerbated by increased movement / activity; sweating; and higher atmospheric temperature.  Stop using clotrimazole-betamethasone to skin fold areas. Start Ketoconazole 2% cream - Apply to skin fold areas every night. Start Zeasorb AF powder - Apply to skin fold areas every morning.   Topical steroids (such as triamcinolone, fluocinolone, fluocinonide, mometasone, clobetasol, halobetasol, betamethasone, hydrocortisone) can cause thinning and lightening of the skin if they are used for too long in the same area. Your physician has selected the right strength medicine for your problem and area affected on the body. Please use your medication only as directed by your physician to prevent side effects.    Due to recent changes in healthcare laws, you may see results of your pathology and/or laboratory studies on MyChart before the doctors have had a chance to review them. We understand that in some cases there may be results that are confusing or concerning to you. Please understand that not all results are received at the same time and often the doctors may need to interpret multiple results in order to provide you with the best plan of care or course of treatment. Therefore, we ask that you please give [place] 2 business days to thoroughly review all your results before contacting the office for clarification. Should we see a critical lab result, you will be contacted sooner.   If You Need Anything After Your Visit  If you have any questions or concerns for your doctor, please call our main line at [phone] and press option 4 to reach your doctor's medical assistant. If no one answers, please leave a voicemail as directed and we will return your call as soon as possible. Messages left after 4 pm will be answered the following business day.   You  may also send [place] a message via [place]. We typically respond to MyChart messages within 1-2 business days.  For prescription refills, please ask your pharmacy to contact our office. Our fax number is [phone].  If you have an urgent issue when the clinic is closed that cannot wait until the next business day, you can page your doctor at the number below.    Please note that while we do our best to be available for urgent issues outside of office hours, we are not available 24/7.   If you have an urgent issue and are unable to reach [place], you may choose to seek medical care at your doctor's office, retail clinic, urgent care center, or emergency room.  If you have a medical emergency, please immediately call 911 or go to the emergency department.  Pager Numbers  - Dr. Nehemiah Massed: [phone]  - Dr. Laurence Ferrari: [phone]  - Dr. Nicole Kindred: [phone]  In the event of inclement weather, please call our main line at [phone] for an update on the status of any delays or closures.  Dermatology Medication Tips: Please keep the boxes that topical medications come in in order to help keep track of the instructions about where and how to use these. Pharmacies typically print the medication instructions only on the boxes and not directly on the medication tubes.   If your medication is too expensive, please contact our office at [phone] option 4 or send [place] a message through [place].   We are unable to tell what your co-pay for medications will be in advance as this is different  depending on your insurance coverage. However, we may be able to find a substitute medication at lower cost or fill out paperwork to get insurance to cover a needed medication.   If a prior authorization is required to get your medication covered by your insurance company, please allow [place] 1-2 business days to complete this process.  Drug prices often vary depending on where the prescription is filled and some  pharmacies may offer cheaper prices.  The website www.goodrx.com contains coupons for medications through different pharmacies. The prices here do not account for what the cost may be with help from insurance (it may be cheaper with your insurance), but the website can give you the price if you did not use any insurance.  - You can print the associated coupon and take it with your prescription to the pharmacy.  - You may also stop by our office during regular business hours and pick up a GoodRx coupon card.  - If you need your prescription sent electronically to a different pharmacy, notify our office through [facility] or by phone at [phone] option 4.     Si Usted Necesita Algo Despus de Su Visita  Tambin puede enviarnos un mensaje a travs de Pharmacist, community. Por lo general respondemos a los mensajes de MyChart en el transcurso de 1 a 2 das hbiles.  Para renovar recetas, por favor pida a su farmacia que se ponga en contacto con nuestra oficina. Harland Dingwall de fax es [place] [phone].  Si tiene un asunto urgente cuando la clnica est cerrada y que no puede esperar hasta el siguiente da hbil, puede llamar/localizar a su doctor(a) al nmero que aparece a continuacin.   Por favor, tenga en cuenta que aunque hacemos todo lo posible para estar disponibles para asuntos urgentes fuera del horario de [place], no estamos disponibles las 24 horas del da, los 7 das de la [place].   Si tiene un problema urgente y no puede comunicarse con nosotros, puede optar por buscar atencin mdica  en el consultorio de su doctor(a), en una clnica privada, en un centro de atencin urgente o en una sala de emergencias.  Si tiene Engineering geologist, por favor llame inmediatamente al 911 o vaya a la sala de emergencias.  Nmeros de bper  - Dr. Nehemiah Massed: [phone]  - Dra. Moye: [phone]  - Dra. Nicole Kindred: [phone]  En caso de inclemencias del [place], por favor llame a Johnsie Kindred  principal al [phone] para una actualizacin sobre el [place] de cualquier retraso o cierre.  Consejos para la medicacin en dermatologa: Por favor, guarde las cajas en las que vienen los medicamentos de uso tpico para ayudarle a seguir las instrucciones sobre dnde y cmo usarlos. Las farmacias generalmente imprimen las instrucciones del medicamento slo en las cajas y no directamente en los tubos del [place].   Si su medicamento es muy caro, por favor, pngase en contacto con Zigmund Daniel llamando al [phone] y presione la opcin 4 o envenos un mensaje a travs de Pharmacist, community.   No podemos decirle cul ser su copago por los medicamentos por adelantado ya que esto es diferente dependiendo de la cobertura de su seguro. Sin embargo, es posible que podamos encontrar un medicamento sustituto a Electrical engineer un formulario para que el seguro cubra el medicamento que se considera necesario.   Si se requiere una autorizacin previa para que su compaa de seguros [place] su medicamento, por favor permtanos de 1 a 2 das hbiles para [employer]  proceso.  Los precios de los medicamentos varan con frecuencia dependiendo del Environmental consultant de dnde se surte la receta y alguna farmacias pueden ofrecer precios ms baratos.  El sitio web www.goodrx.com tiene cupones para medicamentos de Airline pilot. Los precios aqu no tienen en cuenta lo que podra costar con la ayuda del seguro (puede ser ms barato con su seguro), pero el sitio web puede darle el precio si no utiliz Research scientist (physical sciences).  - Puede imprimir el cupn correspondiente y llevarlo con su receta a la farmacia.  - Tambin puede pasar por nuestra oficina durante el horario de atencin regular y Charity fundraiser una tarjeta de cupones de GoodRx.  - Si necesita que su receta se enve electrnicamente a una farmacia diferente, informe a nuestra oficina a travs de MyChart de [place] o por telfono llamando al [phone] y presione la opcin  4.

## 2022-06-12 NOTE — Progress Notes (Signed)
Follow-Up Visit   Subjective  Sherry Gonzalez is a 51 y.o. female who presents for the following: Follow-up.  Patient here for 6 month follow-up. Rosacea is improved  with Soolantra Cream, but still has some redness. She is having laser treatments by Lind Covert every 2 1/2 months. Seborrheic Dermatitis of the scalp is controlled with clobetasol shampoo and mometasone lotion. She also has a rash of the right inguinal crease. She was given Diflucan and Lotrisone Cream from another provider. She would like this area checked to see if there is another treatment she can try.   The following portions of the chart were reviewed this encounter and updated as appropriate:       Review of Systems:  No other skin or systemic complaints except as noted in HPI or Assessment and Plan.  Objective  Well appearing patient in no apparent distress; mood and affect are within normal limits.  A focused examination was performed including face, scalp, inguinal. Relevant physical exam findings are noted in the Assessment and Plan.  R inguinal crease Pink macerated macules and papules of the right inguinal crease  face Erythema on the cheeks and nose with telangiectasias  Scalp Clear    Assessment & Plan  Erythema intertrigo R inguinal crease  Chronic and persistent condition with duration or expected duration over one year. Condition is bothersome/symptomatic for patient. Currently flared.   Intertrigo is a chronic recurrent rash that occurs in skin fold areas that may be associated with friction; heat; moisture; yeast; fungus; and bacteria.  It is exacerbated by increased movement / activity; sweating; and higher atmospheric temperature.  d/c Lotrisone Cream due to risk skin atrophy from steroid  Start Ketoconazole 2% Cream Apply to skin folds QHS dsp 60g 5Rf. Continue Diflucan 150 mg as previously prescribed.  Start Zeasorb AF Powder Apply to skin folds every morning and prior to  exercise  ketoconazole (NIZORAL) 2 % cream - R inguinal crease Apply to skin folds every night for rash.  Rosacea face  Chronic condition with duration or expected duration over one year. Currently well-controlled.   Rosacea is a chronic progressive skin condition usually affecting the face of adults, causing redness and/or acne bumps. It is treatable but not curable. It sometimes affects the eyes (ocular rosacea) as well. It may respond to topical and/or systemic medication and can flare with stress, sun exposure, alcohol, exercise and some foods.  Daily application of broad spectrum spf 30+ sunscreen to face is recommended to reduce flares.  Continue Soolantra Cream Apply to face QHS.  Continue BBL treatments (referred to [place]) Continue daily sunscreen  Discussed adding Rhofade cream for persistent redness, pt defers at this time  Related Medications Ivermectin (SOOLANTRA) 1 % CREA Apply to face at bedtime.  Seborrheic dermatitis Scalp  Chronic condition with duration or expected duration over one year. Currently well-controlled.   Seborrheic Dermatitis  -  is a chronic persistent rash characterized by pinkness and scaling most commonly of the mid face but also can occur on the scalp (dandruff), ears; mid chest, mid back and groin.  It tends to be exacerbated by stress and cooler weather.  People who have neurologic disease may experience new onset or exacerbation of existing seborrheic dermatitis.  The condition is not curable but treatable and can be controlled.    Continue clobetasol shampoo apply 1-2 times per week, massage into scalp and leave in for 15 minutes before rinsing out. Avoid applying to face, groin, and axilla. Use as  directed. Long-term use can cause thinning of the skin.  Continue mometasone lotion Apply 1-2 gtts to AA scalp QD/BID prn itch. Avoid face, groin, axilla.  Topical steroids (such as triamcinolone, fluocinolone, fluocinonide, mometasone, clobetasol,  halobetasol, betamethasone, hydrocortisone) can cause thinning and lightening of the skin if they are used for too long in the same area. Your physician has selected the right strength medicine for your problem and area affected on the body. Please use your medication only as directed by your physician to prevent side effects.    mometasone (ELOCON) 0.1 % lotion - Scalp Apply 1-2 drops to affected areas scalp once to twice daily as needed for itch. Avoid face, groin, underarms.  Related Medications Clobetasol Propionate 0.05 % shampoo Apply to dry scalp twice weekly, let sit for 5 minutes, then massage and rinse in shower. When improved, decrease to once weekly.   Return in about 6 months (around 12/13/2022) for rosacea, seb derm, intertrigo.  IJamesetta Orleans, CMA, am acting as scribe for Brendolyn Patty, MD .  Documentation: I have reviewed the above documentation for accuracy and completeness, and I agree with the above.  Brendolyn Patty MD

## 2022-06-13 ENCOUNTER — Other Ambulatory Visit: Payer: Self-pay | Admitting: Family Medicine

## 2022-06-15 NOTE — Telephone Encounter (Signed)
Refilled 05/02/2022 #90 3 refills - 1 year supply. Requested Prescriptions  Pending Prescriptions Disp Refills  . levothyroxine (SYNTHROID) 75 MCG tablet [Pharmacy Med Name: SYNTHROID 0.'075MG'$  (75MCG)TABLETS] 90 tablet 3    Sig: TAKE 1 TABLET(75 MCG) BY MOUTH DAILY BEFORE BREAKFAST     Endocrinology:  Hypothyroid Agents Passed - 06/13/2022  3:44 PM      Passed - TSH in normal range and within 360 days    TSH  Date Value Ref Range Status  05/01/2022 2.450 0.450 - 4.500 uIU/mL Final         Passed - Valid encounter within last 12 months    Recent Outpatient Visits          2 months ago Upper respiratory tract infection, unspecified type   [place], Megan P, DO   3 months ago Panic disorder without agoraphobia   [facility] [place], Megan P, DO   9 months ago Routine general medical examination at a health care facility   [place], [place], DO   1 year ago Chronic fatigue   [place], [place], DO   1 year ago Routine general medical examination at a health care facility   [facility] Valerie Roys, DO      Future Appointments            In 3 months Valerie Roys, DO [facility], [place]   In 6 months Brendolyn Patty, MD [place]

## 2022-07-13 ENCOUNTER — Telehealth: Payer: Self-pay | Admitting: Family Medicine

## 2022-07-13 NOTE — Telephone Encounter (Signed)
Pt also would like to have refills for fluconazole (DIFLUCAN) 150 MG tablet on hand/ she stated she doesn't need then at this moment but would like to have them on hand at the pharmacy if needed / please advise

## 2022-07-13 NOTE — Telephone Encounter (Signed)
Requested medication (s) are due for refill today: Symbicort - No  Diflucan - yes  Requested medication (s) are on the active medication list: Yes  Last refill:  Symbicort - 04/20/22   Diflucan - 03/22/22  Future visit scheduled: Yes  Notes to clinic:  See request.    Requested Prescriptions  Pending Prescriptions Disp Refills   fluconazole (DIFLUCAN) 150 MG tablet 2 tablet 2    Sig: Take 1 tablet (150 mg total) by mouth daily. May repeat after 24 hours if not better     Off-Protocol Failed - 07/13/2022 11:59 AM      Failed - Medication not assigned to a protocol, review manually.      Passed - Valid encounter within last 12 months    Recent Outpatient Visits           3 months ago Upper respiratory tract infection, unspecified type   [place], Megan P, DO   4 months ago Panic disorder without agoraphobia   [facility] [place], Megan P, DO   10 months ago Routine general medical examination at a health care facility   [place], [place], DO   1 year ago Chronic fatigue   [place], [place], DO   1 year ago Routine general medical examination at a health care facility   [place], Barb Merino, DO       Future Appointments             In 2 months Valerie Roys, DO [facility], [place]   In 5 months Brendolyn Patty, MD [place]             budesonide-formoterol [facility]) 160-4.5 MCG/ACT inhaler 1 each 3    Sig: Inhale 2 puffs into the lungs 2 (two) times daily.     Pulmonology:  Combination Products Passed - 07/13/2022 11:59 AM      Passed - Valid encounter within last 12 months    Recent Outpatient Visits           3 months ago Upper respiratory tract infection, unspecified type   [facility], Megan P, DO   4 months ago Panic disorder without agoraphobia   [facility] [place], Megan P, DO   10 months ago  Routine general medical examination at a health care facility   [place], [place], DO   1 year ago Chronic fatigue   [place], [place], DO   1 year ago Routine general medical examination at a health care facility   [facility] Valerie Roys, DO       Future Appointments             In 2 months Valerie Roys, DO [facility], [place]   In 5 months Brendolyn Patty, MD [place]

## 2022-07-13 NOTE — Telephone Encounter (Signed)
Pt says that her insurance is requesting that she change to a 3 month supply for these 2 Rx.   budesonide-formoterol (SYMBICORT) 160-4.5 MCG/ACT levothyroxine (SYNTHROID) 75 MCG tablet     Pharmacy: [facility] [place], [place] AT [facility] OF [address]  [address] [place], [place] [address]  Phone:  [phone]  Fax:  [phone]    CB: [phone]

## 2022-07-14 MED ORDER — FLUCONAZOLE 150 MG PO TABS: 150.0000 mg | ORAL_TABLET | Freq: Every day | ORAL | 2 refills | Status: DC

## 2022-07-14 MED ORDER — BUDESONIDE-FORMOTEROL FUMARATE 160-4.5 MCG/ACT IN AERO: 2.0000 | INHALATION_SPRAY | Freq: Two times a day (BID) | RESPIRATORY_TRACT | 3 refills | Status: DC

## 2022-07-18 ENCOUNTER — Telehealth: Payer: Self-pay | Admitting: Family Medicine

## 2022-07-18 NOTE — Telephone Encounter (Signed)
Patient called in about getting brand name(synthroid of levothyroxine , not the generic. Please call back

## 2022-07-18 NOTE — Telephone Encounter (Signed)
Patient called in states that the budesonide-formoterol (SYMBICORT) 160-4.5 MCG/ACT inhaler , it's not covered for a 90 day supply and its too expensive around [id number].Marland [id number]. Please call back for another alternative

## 2022-07-18 NOTE — Telephone Encounter (Signed)
Patient called in med, budesonide-formoterol (SYMBICORT) 160-4.5 MCG/ACT inhaler  is not covered under 90 day supply. Too expensive. Please call back to discuss

## 2022-07-19 MED ORDER — LEVOTHYROXINE SODIUM 75 MCG PO TABS: ORAL_TABLET | ORAL | 3 refills | Status: DC

## 2022-07-19 NOTE — Telephone Encounter (Signed)
Can we check to see what is covered?

## 2022-07-19 NOTE — Telephone Encounter (Signed)
Patient called to get an update on the synthroid rx. Patient states the generic did not seem to work as well and she is ok paying the extra money for the brand name. Please advise.

## 2022-07-19 NOTE — Telephone Encounter (Signed)
I don't see that she's ever had the brand synthroid- can we please find out what's going on- it will be more expensive for her. I'm fine to call it in, but we may be able to get it covered if we know why

## 2022-07-20 ENCOUNTER — Other Ambulatory Visit: Payer: Self-pay | Admitting: Family Medicine

## 2022-07-20 NOTE — Telephone Encounter (Signed)
Please send rx for budesonide-formoterol (SYMBICORT) 160-4.5 MCG/ACT inhaler  to [place], [place] Phone:  [phone]  Fax:  [phone]    Rx was sent to [facility] on 07/14/22.

## 2022-07-20 NOTE — Telephone Encounter (Signed)
Per pharmacy inhaler needs to be written for 90 days, pharmacist changed to 90 days and will be $20. Patient is aware.

## 2022-07-21 MED ORDER — BUDESONIDE-FORMOTEROL FUMARATE 160-4.5 MCG/ACT IN AERO
2.0000 | INHALATION_SPRAY | Freq: Two times a day (BID) | RESPIRATORY_TRACT | 3 refills | Status: DC
Start: 2022-07-21 — End: 2022-09-29

## 2022-07-21 NOTE — Telephone Encounter (Signed)
Pt wants mail order Requested Prescriptions  Pending Prescriptions Disp Refills  . budesonide-formoterol (SYMBICORT) 160-4.5 MCG/ACT inhaler 1 each 3    Sig: Inhale 2 puffs into the lungs 2 (two) times daily.     Pulmonology:  Combination Products Passed - 07/20/2022  2:21 PM      Passed - Valid encounter within last 12 months    Recent Outpatient Visits          4 months ago Upper respiratory tract infection, unspecified type   [facility], Megan P, DO   4 months ago Panic disorder without agoraphobia   [facility] [address] [place], Megan P, DO   10 months ago Routine general medical examination at a health care facility   [place], [place], DO   1 year ago Chronic fatigue   [place], [place], DO   1 year ago Routine general medical examination at a health care facility   [facility] Valerie Roys, DO      Future Appointments            In 2 months Valerie Roys, DO [facility], [place]   In 5 months Brendolyn Patty, MD [place]

## 2022-09-07 ENCOUNTER — Other Ambulatory Visit: Payer: Self-pay | Admitting: Family Medicine

## 2022-09-07 NOTE — Telephone Encounter (Signed)
Requested Prescriptions  Pending Prescriptions Disp Refills  . mirtazapine (REMERON) 15 MG tablet [Pharmacy Med Name: MIRTAZAPINE '15MG'$  TABLETS] 90 tablet 0    Sig: TAKE 1 TABLET(15 MG) BY MOUTH AT BEDTIME     Psychiatry: Antidepressants - mirtazapine Passed - 09/07/2022 10:29 AM      Passed - Valid encounter within last 6 months    Recent Outpatient Visits          5 months ago Upper respiratory tract infection, unspecified type   [facility], Megan P, DO   6 months ago Panic disorder without agoraphobia   [facility] [place], Megan P, DO   11 months ago Routine general medical examination at a health care facility   [place], [place], DO   1 year ago Chronic fatigue   [place], [place], DO   1 year ago Routine general medical examination at a health care facility   [place], Barb Merino, DO      Future Appointments            In 3 weeks Valerie Roys, DO [facility], [place]   In 3 months Brendolyn Patty, MD [place]

## 2022-09-18 ENCOUNTER — Other Ambulatory Visit: Payer: Self-pay | Admitting: Family Medicine

## 2022-09-19 NOTE — Telephone Encounter (Signed)
Future OV in 1 week .  Requested Prescriptions  Pending Prescriptions Disp Refills  . albuterol (VENTOLIN HFA) 108 (90 Base) MCG/ACT inhaler [Pharmacy Med Name: ALBUTEROL HFA INH (200 PUFFS) 8.5GM] 8.5 g 0    Sig: INHALE 2 PUFFS INTO THE LUNGS EVERY 6 HOURS AS NEEDED FOR WHEEZING OR SHORTNESS OF BREATH     Pulmonology:  Beta Agonists 2 Passed - 09/18/2022 11:22 AM      Passed - Last BP in normal range    BP Readings from Last 1 Encounters:  03/22/22 117/76         Passed - Last Heart Rate in normal range    Pulse Readings from Last 1 Encounters:  03/22/22 74         Passed - Valid encounter within last 12 months    Recent Outpatient Visits          6 months ago Upper respiratory tract infection, unspecified type   [facility] [place], Megan P, DO   6 months ago Panic disorder without agoraphobia   [facility] [place], Megan P, DO   1 year ago Routine general medical examination at a health care facility   [place], [place], DO   1 year ago Chronic fatigue   [place], [place], DO   2 years ago Routine general medical examination at a health care facility   [place], Barb Merino, DO      Future Appointments            In 1 week Valerie Roys, DO [facility], [place]   In 3 months Brendolyn Patty, MD [place]

## 2022-09-29 ENCOUNTER — Ambulatory Visit (INDEPENDENT_AMBULATORY_CARE_PROVIDER_SITE_OTHER): Payer: BC Managed Care – PPO | Admitting: Family Medicine

## 2022-09-29 ENCOUNTER — Encounter: Payer: Self-pay | Admitting: Family Medicine

## 2022-09-29 VITALS — BP 125/78 | HR 90 | Temp 97.9°F | Ht 59.5 in | Wt 186.0 lb

## 2022-09-29 DIAGNOSIS — E039 Hypothyroidism, unspecified: Secondary | ICD-10-CM | POA: Diagnosis not present

## 2022-09-29 DIAGNOSIS — Z0000 Encounter for general adult medical examination without abnormal findings: Secondary | ICD-10-CM | POA: Diagnosis not present

## 2022-09-29 DIAGNOSIS — E559 Vitamin D deficiency, unspecified: Secondary | ICD-10-CM | POA: Diagnosis not present

## 2022-09-29 DIAGNOSIS — Z1231 Encounter for screening mammogram for malignant neoplasm of breast: Secondary | ICD-10-CM

## 2022-09-29 DIAGNOSIS — Z136 Encounter for screening for cardiovascular disorders: Secondary | ICD-10-CM | POA: Diagnosis not present

## 2022-09-29 DIAGNOSIS — F410 Panic disorder [episodic paroxysmal anxiety] without agoraphobia: Secondary | ICD-10-CM | POA: Diagnosis not present

## 2022-09-29 LAB — URINALYSIS, ROUTINE W REFLEX MICROSCOPIC
Bilirubin, UA: NEGATIVE
Glucose, UA: NEGATIVE
Ketones, UA: NEGATIVE
Leukocytes,UA: NEGATIVE
Nitrite, UA: NEGATIVE
Protein,UA: NEGATIVE
RBC, UA: NEGATIVE
Specific Gravity, UA: 1.01 (ref 1.005–1.030)
Urobilinogen, Ur: 0.2 mg/dL (ref 0.2–1.0)
pH, UA: 6.5 (ref 5.0–7.5)

## 2022-09-29 MED ORDER — MIRTAZAPINE 15 MG PO TABS: 15.0000 mg | ORAL_TABLET | Freq: Every day | ORAL | 1 refills | Status: DC

## 2022-09-29 MED ORDER — BUDESONIDE-FORMOTEROL FUMARATE 160-4.5 MCG/ACT IN AERO: 2.0000 | INHALATION_SPRAY | Freq: Two times a day (BID) | RESPIRATORY_TRACT | 3 refills | Status: DC

## 2022-09-29 MED ORDER — ALBUTEROL SULFATE HFA 108 (90 BASE) MCG/ACT IN AERS: 2.0000 | INHALATION_SPRAY | Freq: Four times a day (QID) | RESPIRATORY_TRACT | 6 refills | Status: DC | PRN

## 2022-09-29 MED ORDER — ALPRAZOLAM 0.25 MG PO TABS: ORAL_TABLET | ORAL | 0 refills | Status: DC

## 2022-09-29 NOTE — Patient Instructions (Signed)
Please call to schedule your mammogram and/or bone density: [facility] at [facility]  Address: [address], [place], [place] [address] Phone: [phone]  [facility] [place] [address] [place],  [place]  [address] Phone: [phone]   

## 2022-09-29 NOTE — Progress Notes (Signed)
BP 125/78   Pulse 90   Temp 97.9 F (36.6 C)   Ht 4' 11.5" (1.511 m)   Wt 186 lb (84.4 kg)   SpO2 98%   BMI 36.94 kg/m    Subjective:    Patient ID: Sherry Gonzalez, female    DOB: November 24, 1970, 51 y.o.   MRN: [id number]  HPI: Sherry Gonzalez is a 51 y.o. female presenting on 09/29/2022 for comprehensive medical examination. Current medical complaints include:  HYPOTHYROIDISM Thyroid control status:excellent compliance Satisfied with current treatment? yes Medication side effects: no Medication compliance: excellent compliance Recent dose adjustment:no Fatigue: no Cold intolerance: no Heat intolerance: no Weight gain: no Weight loss: no Constipation: no Diarrhea/loose stools: no Palpitations: no Lower extremity edema: no Anxiety/depressed mood: no  ANXIETY/STRESS Duration: chronic Status:controlled Anxious mood: no  Excessive worrying: no Irritability: no  Sweating: no Nausea: no Palpitations:no Hyperventilation: no Panic attacks: no Agoraphobia: no  Obscessions/compulsions: no Depressed mood: no    09/29/2022    8:45 AM 09/13/2021    8:18 AM 09/10/2020    8:01 AM 08/26/2019    8:37 AM 04/11/2019    7:54 AM  Depression screen PHQ 2/9  Decreased Interest 0 0 0 0 0  Down, Depressed, Hopeless 0 0 0 0 0  PHQ - 2 Score 0 0 0 0 0  Altered sleeping 0 0 0 0 1  Tired, decreased energy 0 0 0 0 0  Change in appetite 0 0 0 0 0  Feeling bad or failure about yourself  0 0 0 0 0  Trouble concentrating 0 0 0 0 0  Moving slowly or fidgety/restless 0 0 0 0 0  Suicidal thoughts 0 0 0 0 0  PHQ-9 Score 0 0 0 0 1  Difficult doing work/chores Not difficult at all  Not difficult at all Not difficult at all Not difficult at all   Anhedonia: no Weight changes: no Insomnia: no   Hypersomnia: no Fatigue/loss of energy: no Feelings of worthlessness: no Feelings of guilt: no Impaired concentration/indecisiveness: no Suicidal ideations: no  Crying spells: no Recent Stressors/Life  Changes: no   Relationship problems: no   Family stress: no     Financial stress: no    Job stress: no    Recent death/loss: no   Menopausal Symptoms: no  Depression Screen done today and results listed below:     09/29/2022    8:45 AM 09/13/2021    8:18 AM 09/10/2020    8:01 AM 08/26/2019    8:37 AM 04/11/2019    7:54 AM  Depression screen PHQ 2/9  Decreased Interest 0 0 0 0 0  Down, Depressed, Hopeless 0 0 0 0 0  PHQ - 2 Score 0 0 0 0 0  Altered sleeping 0 0 0 0 1  Tired, decreased energy 0 0 0 0 0  Change in appetite 0 0 0 0 0  Feeling bad or failure about yourself  0 0 0 0 0  Trouble concentrating 0 0 0 0 0  Moving slowly or fidgety/restless 0 0 0 0 0  Suicidal thoughts 0 0 0 0 0  PHQ-9 Score 0 0 0 0 1  Difficult doing work/chores Not difficult at all  Not difficult at all Not difficult at all Not difficult at all    Past Medical History:  Past Medical History:  Diagnosis Date  . Allergy-induced asthma   . Carpal tunnel syndrome, left    left upper limbt  . Carpal tunnel syndrome,  right    right upper limb  . Eustachian tube disorder    left ear  . Hand eczema   . Hypothyroidism   . Mild intermittent asthma   . Trigger ring finger of left hand     Surgical History:  Past Surgical History:  Procedure Laterality Date  . COLONOSCOPY WITH PROPOFOL N/A 10/11/2020   Procedure: COLONOSCOPY WITH PROPOFOL;  Surgeon: Lucilla Lame, MD;  Location: [facility];  Service: Endoscopy;  Laterality: N/A;  . WISDOM TOOTH EXTRACTION      Medications:  Current Outpatient Medications on File Prior to Visit  Medication Sig  . acetaminophen (TYLENOL) 325 MG tablet Take 325 mg by mouth as needed.  Marland Kitchen albuterol (VENTOLIN HFA) 108 (90 Base) MCG/ACT inhaler INHALE 2 PUFFS INTO THE LUNGS EVERY 6 HOURS AS NEEDED FOR WHEEZING OR SHORTNESS OF BREATH  . ALPRAZolam (XANAX) 0.25 MG tablet TAKE 1/2 TABLET BY MOUTH DAILY AS NEEDED FOR ANXIETY AND 1/2 TABLET AT NIGHT AS NEEDED FOR  SLEEP  . budesonide-formoterol (SYMBICORT) 160-4.5 MCG/ACT inhaler Inhale 2 puffs into the lungs 2 (two) times daily.  . Cetirizine-Pseudoephedrine (ZYRTEC-D PO) Take by mouth.  . Clobetasol Propionate 0.05 % shampoo Apply to dry scalp twice weekly, let sit for 5 minutes, then massage and rinse in shower. When improved, decrease to once weekly.  . clotrimazole-betamethasone (LOTRISONE) cream Apply 1 application  topically daily.  . fluconazole (DIFLUCAN) 150 MG tablet Take 1 tablet (150 mg total) by mouth daily. May repeat after 24 hours if not better  . Ivermectin (SOOLANTRA) 1 % CREA Apply to face at bedtime.  Marland Kitchen ketoconazole (NIZORAL) 2 % cream Apply to skin folds every night for rash.  . levothyroxine (SYNTHROID) 75 MCG tablet TAKE 1 TABLET(75 MCG) BY MOUTH DAILY BEFORE BREAKFAST  . mirtazapine (REMERON) 15 MG tablet TAKE 1 TABLET(15 MG) BY MOUTH AT BEDTIME  . mometasone (ELOCON) 0.1 % lotion Apply 1-2 drops to affected areas scalp once to twice daily as needed for itch. Avoid face, groin, underarms.  Marland Kitchen VITAMIN D PO Take by mouth.   No current facility-administered medications on file prior to visit.    Allergies:  No Known Allergies  Social History:  Social History   Socioeconomic History  . Marital status: Single    Spouse name: Not on file  . Number of children: Not on file  . Years of education: Not on file  . Highest education level: Not on file  Occupational History  . Not on file  Tobacco Use  . Smoking status: Never  . Smokeless tobacco: Never  Vaping Use  . Vaping Use: Never used  Substance and Sexual Activity  . Alcohol use: No    Alcohol/week: 0.0 standard drinks of alcohol  . Drug use: No  . Sexual activity: Never  Other Topics Concern  . Not on file  Social History Narrative  . Not on file   Social Determinants of Health   Financial Resource Strain: Not on file  Food Insecurity: Not on file  Transportation Needs: Not on file  Physical Activity: Not on  file  Stress: Not on file  Social Connections: Not on file  Intimate Partner Violence: Not on file   Social History   Tobacco Use  Smoking Status Never  Smokeless Tobacco Never   Social History   Substance and Sexual Activity  Alcohol Use No  . Alcohol/week: 0.0 standard drinks of alcohol    Family History:  Family History  Problem Relation Age  of Onset  . Alcohol abuse Father   . Ehlers-Danlos syndrome Mother        Mothers side of the family  . Migraines Sister   . Breast cancer Neg Hx     Past medical history, surgical history, medications, allergies, family history and social history reviewed with patient today and changes made to appropriate areas of the chart.   Review of Systems  Constitutional: Negative.   HENT: Negative.    Eyes: Negative.   Respiratory: Negative.    Cardiovascular: Negative.   Gastrointestinal: Negative.   Skin: Negative.   Psychiatric/Behavioral: Negative.     All other ROS negative except what is listed above and in the HPI.      Objective:    BP 125/78   Pulse 90   Temp 97.9 F (36.6 C)   Ht 4' 11.5" (1.511 m)   Wt 186 lb (84.4 kg)   SpO2 98%   BMI 36.94 kg/m   Wt Readings from Last 3 Encounters:  09/29/22 186 lb (84.4 kg)  03/22/22 192 lb 6.4 oz (87.3 kg)  03/07/22 195 lb (88.5 kg)    Physical Exam  Results for orders placed or performed in visit on 05/01/22  TSH  Result Value Ref Range   TSH 2.450 0.450 - 4.500 uIU/mL      Assessment & Plan:   Problem List Items Addressed This Visit       Endocrine   Hypothyroid   Relevant Orders   TSH     Other   Anxiety disorder   Vitamin D deficiency   Relevant Orders   VITAMIN D 25 Hydroxy (Vit-D Deficiency, Fractures)   Other Visit Diagnoses     Routine general medical examination at a health care facility    -  Primary   Relevant Orders   CBC with Differential/Platelet   Comprehensive metabolic panel   Lipid Panel w/o Chol/HDL Ratio   Urinalysis, Routine w  reflex microscopic   TSH        Follow up plan: Return in about 6 months (around 03/30/2023).   LABORATORY TESTING:  - Pap smear: up to date  IMMUNIZATIONS:   - Tdap: Tetanus vaccination status reviewed: last tetanus booster within 10 years. - Influenza: Up to date - Pneumovax: Not applicable - Prevnar: Not applicable - COVID: Up to date - HPV: Not applicable - Shingrix vaccine: Not applicable  SCREENING: -Mammogram: Ordered today  - Colonoscopy: Up to date   PATIENT COUNSELING:   Advised to take 1 mg of folate supplement per day if capable of pregnancy.   Sexuality: Discussed sexually transmitted diseases, partner selection, use of condoms, avoidance of unintended pregnancy  and contraceptive alternatives.   Advised to avoid cigarette smoking.  I discussed with the patient that most people either abstain from alcohol or drink within safe limits (<=14/week and <=4 drinks/occasion for males, <=7/weeks and <= 3 drinks/occasion for females) and that the risk for alcohol disorders and other health effects rises proportionally with the number of drinks per week and how often a drinker exceeds daily limits.  Discussed cessation/primary prevention of drug use and availability of treatment for abuse.   Diet: Encouraged to adjust caloric intake to maintain  or achieve ideal body weight, to reduce intake of dietary saturated fat and total fat, to limit sodium intake by avoiding high sodium foods and not adding table salt, and to maintain adequate dietary potassium and calcium preferably from fresh fruits, vegetables, and low-fat dairy products.  stressed the importance of regular exercise  Injury prevention: Discussed safety belts, safety helmets, smoke detector, smoking near bedding or upholstery.   Dental health: Discussed importance of regular tooth brushing, flossing, and dental visits.    NEXT PREVENTATIVE PHYSICAL DUE IN 1 YEAR. Return in about 6 months (around  03/30/2023).

## 2022-09-30 LAB — CBC WITH DIFFERENTIAL/PLATELET
Basophils Absolute: 0.1 10*3/uL (ref 0.0–0.2)
Basos: 1 %
EOS (ABSOLUTE): 0.5 10*3/uL — ABNORMAL HIGH (ref 0.0–0.4)
Eos: 6 %
Hematocrit: 42.3 % (ref 34.0–46.6)
Hemoglobin: 14 g/dL (ref 11.1–15.9)
Immature Grans (Abs): 0 10*3/uL (ref 0.0–0.1)
Immature Granulocytes: 0 %
Lymphocytes Absolute: 1.2 10*3/uL (ref 0.7–3.1)
Lymphs: 14 %
MCH: 30 pg (ref 26.6–33.0)
MCHC: 33.1 g/dL (ref 31.5–35.7)
MCV: 91 fL (ref 79–97)
Monocytes Absolute: 0.6 10*3/uL (ref 0.1–0.9)
Monocytes: 7 %
Neutrophils Absolute: 6.1 10*3/uL (ref 1.4–7.0)
Neutrophils: 72 %
Platelets: 335 10*3/uL (ref 150–450)
RBC: 4.67 x10E6/uL (ref 3.77–5.28)
RDW: 13 % (ref 11.7–15.4)
WBC: 8.6 10*3/uL (ref 3.4–10.8)

## 2022-09-30 LAB — COMPREHENSIVE METABOLIC PANEL
ALT: 12 IU/L (ref 0–32)
AST: 16 IU/L (ref 0–40)
Albumin/Globulin Ratio: 1.5 (ref 1.2–2.2)
Albumin: 4.4 g/dL (ref 3.9–4.9)
Alkaline Phosphatase: 93 IU/L (ref 44–121)
BUN/Creatinine Ratio: 8 — ABNORMAL LOW (ref 9–23)
BUN: 7 mg/dL (ref 6–24)
Bilirubin Total: 0.8 mg/dL (ref 0.0–1.2)
CO2: 26 mmol/L (ref 20–29)
Calcium: 9.4 mg/dL (ref 8.7–10.2)
Chloride: 101 mmol/L (ref 96–106)
Creatinine, Ser: 0.87 mg/dL (ref 0.57–1.00)
Globulin, Total: 2.9 g/dL (ref 1.5–4.5)
Glucose: 86 mg/dL (ref 70–99)
Potassium: 3.9 mmol/L (ref 3.5–5.2)
Sodium: 139 mmol/L (ref 134–144)
Total Protein: 7.3 g/dL (ref 6.0–8.5)
eGFR: 82 mL/min/{1.73_m2} (ref >59)

## 2022-09-30 LAB — TSH: TSH: 1.45 u[IU]/mL (ref 0.450–4.500)

## 2022-09-30 LAB — LIPID PANEL W/O CHOL/HDL RATIO
Cholesterol, Total: 181 mg/dL (ref 100–199)
HDL: 52 mg/dL (ref >39)
LDL Chol Calc (NIH): 96 mg/dL (ref 0–99)
Triglycerides: 193 mg/dL — ABNORMAL HIGH (ref 0–149)
VLDL Cholesterol Cal: 33 mg/dL (ref 5–40)

## 2022-09-30 LAB — HEPATITIS B SURFACE ANTIBODY, QUANTITATIVE: Hepatitis B Surf Ab Quant: 172.4 m[IU]/mL (ref >9.9)

## 2022-09-30 LAB — HEPATITIS A ANTIBODY, IGM: Hep A IgM: NEGATIVE

## 2022-09-30 LAB — VITAMIN D 25 HYDROXY (VIT D DEFICIENCY, FRACTURES): Vit D, 25-Hydroxy: 27.7 ng/mL — ABNORMAL LOW (ref 30.0–100.0)

## 2022-10-01 NOTE — Assessment & Plan Note (Signed)
Rechecking labs today. Await results. Treat as needed.  °

## 2022-10-01 NOTE — Assessment & Plan Note (Signed)
Under good control on current regimen. Continue current regimen. Continue to monitor. Call with any concerns. Refills given.   

## 2022-10-02 ENCOUNTER — Other Ambulatory Visit: Payer: Self-pay | Admitting: Family Medicine

## 2022-10-03 ENCOUNTER — Telehealth: Payer: Self-pay

## 2022-10-03 NOTE — Telephone Encounter (Signed)
-----   Message from Valerie Roys, [place] sent at 10/02/2022 12:55 PM EST ----- Patient notified of results  by mychart  Please get her nurse visit for Hep A#1

## 2022-10-03 NOTE — Telephone Encounter (Signed)
Left message for patient to give our office a call back to get scheduled for a Nurse Visit only for Hep A #1 vaccine.   OK for [facility] to get patient scheduled.

## 2022-10-05 ENCOUNTER — Ambulatory Visit (INDEPENDENT_AMBULATORY_CARE_PROVIDER_SITE_OTHER): Payer: BC Managed Care – PPO

## 2022-10-05 ENCOUNTER — Ambulatory Visit: Payer: BC Managed Care – PPO | Admitting: Family Medicine

## 2022-10-05 DIAGNOSIS — Z23 Encounter for immunization: Secondary | ICD-10-CM | POA: Diagnosis not present

## 2022-11-02 ENCOUNTER — Other Ambulatory Visit: Payer: Self-pay | Admitting: Dermatology

## 2022-11-02 DIAGNOSIS — L304 Erythema intertrigo: Secondary | ICD-10-CM

## 2022-11-09 ENCOUNTER — Ambulatory Visit
Admission: RE | Admit: 2022-11-09 | Discharge: 2022-11-09 | Disposition: A | Discharge status: Home / Self Care | Payer: BC Managed Care – PPO | Source: Ambulatory Visit | Attending: Family Medicine | Admitting: Family Medicine

## 2022-11-09 DIAGNOSIS — Z1231 Encounter for screening mammogram for malignant neoplasm of breast: Secondary | ICD-10-CM | POA: Diagnosis not present

## 2022-12-25 ENCOUNTER — Ambulatory Visit: Payer: BC Managed Care – PPO | Admitting: Dermatology

## 2022-12-25 VITALS — BP 124/74 | HR 88

## 2022-12-25 DIAGNOSIS — L719 Rosacea, unspecified: Secondary | ICD-10-CM

## 2022-12-25 DIAGNOSIS — L219 Seborrheic dermatitis, unspecified: Secondary | ICD-10-CM | POA: Diagnosis not present

## 2022-12-25 DIAGNOSIS — L304 Erythema intertrigo: Secondary | ICD-10-CM

## 2022-12-25 DIAGNOSIS — L820 Inflamed seborrheic keratosis: Secondary | ICD-10-CM

## 2022-12-25 MED ORDER — KETOCONAZOLE 2 % EX CREA: TOPICAL_CREAM | CUTANEOUS | 5 refills | Status: DC

## 2022-12-25 MED ORDER — CLOBETASOL PROPIONATE 0.05 % EX SHAM: MEDICATED_SHAMPOO | CUTANEOUS | 3 refills | Status: DC

## 2022-12-25 MED ORDER — MOMETASONE FUROATE 0.1 % EX SOLN: CUTANEOUS | 3 refills | Status: DC

## 2022-12-25 NOTE — Patient Instructions (Addendum)
Ketoconazole cream - Apply to belly button every morning until pinkness improved. Eucrisa Ointment - Apply to belly button every evening until pinkness improved.    Eucrusa Ointment - Apply to left eyelid twice a day until improved.    Rosacea  What is rosacea? Rosacea (say: ro-zay-sha) is a common skin disease that usually begins as a trend of flushing or blushing easily.  As rosacea progresses, a persistent redness in the center of the face will develop and may gradually spread beyond the nose and cheeks to the forehead and chin.  In some cases, the ears, chest, and back could be affected.  Rosacea may appear as tiny blood vessels or small red bumps that occur in crops.  Frequently they can contain pus, and are called "pustules".  If the bumps do not contain pus, they are referred to as "papules".  Rarely, in prolonged, untreated cases of rosacea, the oil glands of the nose and cheeks may become permanently enlarged.  This is called rhinophyma, and is seen more frequently in men.  Signs and Risks In its beginning stages, rosacea tends to come and go, which makes it difficult to recognize.  It can start as intermittent flushing of the face.  Eventually, blood vessels may become permanently visible.  Pustules and papules can appear, but can be mistaken for adult acne.  People of all races, ages, genders and ethnic groups are at risk of developing rosacea.  However, it is more common in women (especially around menopause) and adults with fair skin between the ages of 62 and 71.  Treatment Dermatologists typically recommend a combination of treatments to effectively manage rosacea.  Treatment can improve symptoms and may stop the progression of the rosacea.  Treatment may involve both topical and oral medications.  The tetracycline antibiotics are often used for their anti-inflammatory effect; however, because of the possibility of developing antibiotic resistance, they should not be used long term at  full dose.  For dilated blood vessels the options include electrodessication (uses electric current through a small needle), laser treatment, and cosmetics to hide the redness.   With all forms of treatment, improvement is a slow process, and patients may not see any results for the first 3-4 weeks.  It is very important to avoid the sun and other triggers.  Patients must wear sunscreen daily.  Skin Care Instructions: Cleanse the skin with a mild soap such as CeraVe cleanser, Cetaphil cleanser, or Dove soap once or twice daily as needed. Moisturize with Eucerin Redness Relief Daily Perfecting Lotion (has a subtle green tint), CeraVe Moisturizing Cream, or Oil of Olay Daily Moisturizer with sunscreen every morning and/or night as recommended. Makeup should be "non-comedogenic" (won't clog pores) and be labeled "for sensitive skin". Good choices for cosmetics are: Neutrogena, Almay, and Physician's Formula.  Any product with a green tint tends to offset a red complexion. If your eyes are dry and irritated, use artificial tears 2-3 times per day and cleanse the eyelids daily with baby shampoo.  Have your eyes examined at least every 2 years.  Be sure to tell your eye doctor that you have rosacea. Alcoholic beverages tend to cause flushing of the skin, and may make rosacea worse. Always wear sunscreen, protect your skin from extreme hot and cold temperatures, and avoid spicy foods, hot drinks, and mechanical irritation such as rubbing, scrubbing, or massaging the face.  Avoid harsh skin cleansers, cleansing masks, astringents, and exfoliation. If a particular product burns or makes your face feel  tight, then it is likely to flare your rosacea. If you are having difficulty finding a sunscreen that you can tolerate, you may try switching to a chemical-free sunscreen.  These are ones whose active ingredient is zinc oxide or titanium dioxide only.  They should also be fragrance free, non-comedogenic, and labeled  for sensitive skin. Rosacea triggers may vary from person to person.  There are a variety of foods that have been reported to trigger rosacea.  Some patients find that keeping a diary of what they were doing when they flared helps them avoid triggers.   Due to recent changes in healthcare laws, you may see results of your pathology and/or laboratory studies on MyChart before the doctors have had a chance to review them. We understand that in some cases there may be results that are confusing or concerning to you. Please understand that not all results are received at the same time and often the doctors may need to interpret multiple results in order to provide you with the best plan of care or course of treatment. Therefore, we ask that you please give [place] 2 business days to thoroughly review all your results before contacting the office for clarification. Should we see a critical lab result, you will be contacted sooner.   If You Need Anything After Your Visit  If you have any questions or concerns for your doctor, please call our main line at [phone] and press option 4 to reach your doctor's medical assistant. If no one answers, please leave a voicemail as directed and we will return your call as soon as possible. Messages left after 4 pm will be answered the following business day.   You may also send [place] a message via [place]. We typically respond to MyChart messages within 1-2 business days.  For prescription refills, please ask your pharmacy to contact our office. Our fax number is [phone].  If you have an urgent issue when the clinic is closed that cannot wait until the next business day, you can page your doctor at the number below.    Please note that while we do our best to be available for urgent issues outside of office hours, we are not available 24/7.   If you have an urgent issue and are unable to reach [place], you may choose to seek medical care at your doctor's office, retail  clinic, urgent care center, or emergency room.  If you have a medical emergency, please immediately call 911 or go to the emergency department.  Pager Numbers  - Dr. Nehemiah Massed: [phone]  - Dr. Laurence Ferrari: [phone]  - Dr. Nicole Kindred: [phone]  In the event of inclement weather, please call our main line at [phone] for an update on the status of any delays or closures.  Dermatology Medication Tips: Please keep the boxes that topical medications come in in order to help keep track of the instructions about where and how to use these. Pharmacies typically print the medication instructions only on the boxes and not directly on the medication tubes.   If your medication is too expensive, please contact our office at [phone] option 4 or send [place] a message through [place].   We are unable to tell what your co-pay for medications will be in advance as this is different depending on your insurance coverage. However, we may be able to find a substitute medication at lower cost or fill out paperwork to get insurance to cover a needed medication.   If a prior authorization  is required to get your medication covered by your insurance company, please allow [place] 1-2 business days to complete this process.  Drug prices often vary depending on where the prescription is filled and some pharmacies may offer cheaper prices.  The website www.goodrx.com contains coupons for medications through different pharmacies. The prices here do not account for what the cost may be with help from insurance (it may be cheaper with your insurance), but the website can give you the price if you did not use any insurance.  - You can print the associated coupon and take it with your prescription to the pharmacy.  - You may also stop by our office during regular business hours and pick up a GoodRx coupon card.  - If you need your prescription sent electronically to a different pharmacy, notify our office through [facility] or by phone at [phone] option 4.     Si Usted Necesita Algo Despus de Su Visita  Tambin puede enviarnos un mensaje a travs de Pharmacist, community. Por lo general respondemos a los mensajes de MyChart en el transcurso de 1 a 2 das hbiles.  Para renovar recetas, por favor pida a su farmacia que se ponga en contacto con nuestra oficina. Harland Dingwall de fax es [place] [phone].  Si tiene un asunto urgente cuando la clnica est cerrada y que no puede esperar hasta el siguiente da hbil, puede llamar/localizar a su doctor(a) al nmero que aparece a continuacin.   Por favor, tenga en cuenta que aunque hacemos todo lo posible para estar disponibles para asuntos urgentes fuera del horario de [place], no estamos disponibles las 24 horas del da, los 7 das de la [place].   Si tiene un problema urgente y no puede comunicarse con nosotros, puede optar por buscar atencin mdica  en el consultorio de su doctor(a), en una clnica privada, en un centro de atencin urgente o en una sala de emergencias.  Si tiene Engineering geologist, por favor llame inmediatamente al 911 o vaya a la sala de emergencias.  Nmeros de bper  - Dr. Nehemiah Massed: [phone]  - Dra. Moye: [phone]  - Dra. Nicole Kindred: [phone]  En caso de inclemencias del [place], por favor llame a Johnsie Kindred principal al [phone] para una actualizacin sobre el [facility] de cualquier retraso o cierre.  Consejos para la medicacin en dermatologa: Por favor, guarde las cajas en las que vienen los medicamentos de uso tpico para ayudarle a seguir las instrucciones sobre dnde y cmo usarlos. Las farmacias generalmente imprimen las instrucciones del medicamento slo en las cajas y no directamente en los tubos del [place].   Si su medicamento es muy caro, por favor, pngase en contacto con Zigmund Daniel llamando al [phone] y presione la opcin 4 o envenos un mensaje a travs de Pharmacist, community.   No podemos decirle  cul ser su copago por los medicamentos por adelantado ya que esto es diferente dependiendo de la cobertura de su seguro. Sin embargo, es posible que podamos encontrar un medicamento sustituto a Electrical engineer un formulario para que el seguro cubra el medicamento que se considera necesario.   Si se requiere una autorizacin previa para que su compaa de seguros [place] su medicamento, por favor permtanos de 1 a 2 das hbiles para completar este proceso.  Los precios de los medicamentos varan con frecuencia dependiendo del Environmental consultant de dnde se surte la receta y alguna farmacias pueden ofrecer precios ms baratos.  El sitio web www.goodrx.com tiene cupones para medicamentos  de diferentes farmacias. Los precios aqu no tienen en cuenta lo que podra costar con la ayuda del seguro (puede ser ms barato con su seguro), pero el sitio web puede darle el precio si no utiliz ningn seguro.  - Puede imprimir el cupn correspondiente y llevarlo con su receta a la farmacia.  - Tambin puede pasar por nuestra oficina durante el horario de atencin regular y recoger una tarjeta de cupones de GoodRx.  - Si necesita que su receta se enve electrnicamente a una farmacia diferente, informe a nuestra oficina a travs de MyChart de [place] o por telfono llamando al [phone] y presione la opcin 4.  

## 2022-12-25 NOTE — Progress Notes (Signed)
Follow-Up Visit   Subjective  Sherry Gonzalez is a 52 y.o. female who presents for the following: Follow-up.  Patient presents for 6 month follow-up. Rosacea is controlled with Soolantra cream and BBL treatments with Sonia Baller. Intertrigo controlled with ketoconazole 2% cream and Zeasorb AF powder. She does have some pinkness of the umbilicus. She has used A&D ointment and Zeasorb AF powder, with some improvement. She has seborrheic dermatitis of the scalp, improved with clobetasol shampoo and mometasone lotion, as needed. She also has a spot on her left upper eyelid that has come up recently that is a little sore and growing.   The following portions of the chart were reviewed this encounter and updated as appropriate:       Review of Systems:  No other skin or systemic complaints except as noted in HPI or Assessment and Plan.  Objective  Well appearing patient in no apparent distress; mood and affect are within normal limits.  A focused examination was performed including face, scalp, trunk. Relevant physical exam findings are noted in the Assessment and Plan.  face Mild erythema on the medial cheeks.  Scalp Mild scaling of the scalp.   lower abdomen, inguinal creases Mild erythema of the umbilicus.  Left Upper Eyelid Erythematous stuck-on, waxy papule    Assessment & Plan  Rosacea face  Chronic and persistent condition with duration or expected duration over one year. Condition is symptomatic / bothersome to patient. Improving with topicals and BBL with Sonia Baller at [facility], but not currently at goal.  Rosacea is a chronic progressive skin condition usually affecting the face of adults, causing redness and/or acne bumps. It is treatable but not curable. It sometimes affects the eyes (ocular rosacea) as well. It may respond to topical and/or systemic medication and can flare with stress, sun exposure, alcohol, exercise, topical steroids (including  hydrocortisone/cortisone 10) and some foods.  Daily application of broad spectrum spf 30+ sunscreen to face is recommended to reduce flares.  Continue Soolantra Cream Apply to face QHS.  Continue BBL treatments (patient sees Sonia Baller) Continue daily sunscreen  Related Medications Ivermectin (SOOLANTRA) 1 % CREA Apply to face at bedtime.  Seborrheic dermatitis Scalp  Chronic and persistent condition with duration or expected duration over one year. Condition is symptomatic / bothersome to patient. Not to goal.  Seborrheic Dermatitis  -  is a chronic persistent rash characterized by pinkness and scaling most commonly of the mid face but also can occur on the scalp (dandruff), ears; mid chest, mid back and groin.  It tends to be exacerbated by stress and cooler weather.  People who have neurologic disease may experience new onset or exacerbation of existing seborrheic dermatitis.  The condition is not curable but treatable and can be controlled.  Continue clobetasol shampoo apply 1-2 times per week, massage into scalp and leave in for 15 minutes before rinsing out. Avoid applying to face, groin, and axilla. Use as directed. Long-term use can cause thinning of the skin. May alternate with Head & Shoulders shampoo - sample given.    Continue mometasone lotion Apply 1-2 gtts to AA scalp QD/BID prn itch. Avoid face, groin, axilla.   Topical steroids (such as triamcinolone, fluocinolone, fluocinonide, mometasone, clobetasol, halobetasol, betamethasone, hydrocortisone) can cause thinning and lightening of the skin if they are used for too long in the same area. Your physician has selected the right strength medicine for your problem and area affected on the body. Please use your medication  only as directed by your physician to prevent side effects.     Related Medications mometasone (ELOCON) 0.1 % lotion Apply 1-2 drops to affected areas scalp once to twice daily as needed for itch. Avoid face, groin,  underarms.  Clobetasol Propionate 0.05 % shampoo Apply to dry scalp twice weekly, let sit for 5 minutes, then massage and rinse in shower. When improved, decrease to once weekly.  Intertrigo lower abdomen, inguinal creases  Chronic and persistent condition with duration or expected duration over one year. Condition is symptomatic / bothersome to patient. Not to goal, but improved.  Intertrigo is a chronic recurrent rash that occurs in skin fold areas that may be associated with friction; heat; moisture; yeast; fungus; and bacteria.  It is exacerbated by increased movement / activity; sweating; and higher atmospheric temperature.  Continue Ketoconazole 2% Cream Apply to skin folds QHS dsp 60g 5Rf. Continue Zeasorb AF Powder Apply to skin folds every morning and prior to exercise. Start Eucrisa ointment Apply to umbilicus QHS and Ketoconazole cream QAM umbilicus.    Erythema intertrigo  Related Medications ketoconazole (NIZORAL) 2 % cream APPLY TOPICALLY TO THE SKIN FOLDS EVERY NIGHT FOR RASH  Inflamed seborrheic keratosis Left Upper Eyelid  Symptomatic, irritating.   Sample of Eucrisa Ointment given today. Apply BID to AA with symptoms.  Lot [facility] Exp 11/2024  If not improving with topical, discussed cryotherapy.    Return in about 6 months (around 06/25/2023) for rosacea.  IJamesetta Orleans, CMA, am acting as scribe for Brendolyn Patty, MD .  Documentation: I have reviewed the above documentation for accuracy and completeness, and I agree with the above.  Brendolyn Patty MD

## 2023-03-24 ENCOUNTER — Other Ambulatory Visit: Payer: Self-pay | Admitting: Family Medicine

## 2023-03-26 NOTE — Telephone Encounter (Signed)
Requested Prescriptions  Pending Prescriptions Disp Refills   albuterol (VENTOLIN HFA) 108 (90 Base) MCG/ACT inhaler [Pharmacy Med Name: ALBUTEROL HFA INH (200 PUFFS) 8.5GM] 18 g 1    Sig: INHALE 2 PUFFS INTO THE LUNGS EVERY 6 HOURS AS NEEDED FOR WHEEZING OR SHORTNESS OF BREATH     Pulmonology:  Beta Agonists 2 Passed - 03/24/2023  9:26 AM      Passed - Last BP in normal range    BP Readings from Last 1 Encounters:  12/25/22 124/74         Passed - Last Heart Rate in normal range    Pulse Readings from Last 1 Encounters:  12/25/22 88         Passed - Valid encounter within last 12 months    Recent Outpatient Visits           5 months ago Routine general medical examination at a health care facility   [facility] [place], [place] P, DO   1 year ago Upper respiratory tract infection, unspecified type   [place] [facility] [place], Megan P, DO   1 year ago Panic disorder without agoraphobia   [place] [facility] [place], Megan P, DO   1 year ago Routine general medical examination at a health care facility   [facility] [place], [place] P, DO   2 years ago Chronic fatigue   [place] [facility] [place], Oralia Rud, DO       Future Appointments             In 1 week Dorcas Carrow, DO [place] [facility]   In 3 months Willeen Niece, MD [facility]

## 2023-04-05 ENCOUNTER — Ambulatory Visit: Payer: BC Managed Care – PPO | Admitting: Family Medicine

## 2023-04-05 VITALS — BP 136/82 | HR 86 | Temp 98.5°F | Ht 59.5 in | Wt 192.9 lb

## 2023-04-05 DIAGNOSIS — Z23 Encounter for immunization: Secondary | ICD-10-CM

## 2023-04-05 DIAGNOSIS — E559 Vitamin D deficiency, unspecified: Secondary | ICD-10-CM

## 2023-04-05 DIAGNOSIS — E039 Hypothyroidism, unspecified: Secondary | ICD-10-CM

## 2023-04-05 DIAGNOSIS — F410 Panic disorder [episodic paroxysmal anxiety] without agoraphobia: Secondary | ICD-10-CM

## 2023-04-05 MED ORDER — ALBUTEROL SULFATE HFA 108 (90 BASE) MCG/ACT IN AERS: 2.0000 | INHALATION_SPRAY | Freq: Four times a day (QID) | RESPIRATORY_TRACT | 2 refills | Status: DC | PRN

## 2023-04-05 MED ORDER — MIRTAZAPINE 15 MG PO TABS: 15.0000 mg | ORAL_TABLET | Freq: Every day | ORAL | 1 refills | Status: DC

## 2023-04-05 MED ORDER — BUDESONIDE-FORMOTEROL FUMARATE 160-4.5 MCG/ACT IN AERO: 2.0000 | INHALATION_SPRAY | Freq: Two times a day (BID) | RESPIRATORY_TRACT | 3 refills | Status: DC

## 2023-04-05 MED ORDER — ALPRAZOLAM 0.25 MG PO TABS: ORAL_TABLET | ORAL | 0 refills | Status: DC

## 2023-04-05 NOTE — Assessment & Plan Note (Signed)
Rechecking labs today. Await results. Treat as needed.  °

## 2023-04-05 NOTE — Progress Notes (Signed)
BP 136/82   Pulse 86   Temp 98.5 F (36.9 C) (Oral)   Ht 4' 11.5" (1.511 m)   Wt 192 lb 14.4 oz (87.5 kg)   SpO2 98%   BMI 38.31 kg/m    Subjective:    Patient ID: Sherry Gonzalez, female    DOB: 08/17/1971, 52 y.o.   MRN: [id number]  HPI: Sherry Gonzalez is a 52 y.o. female  Chief Complaint  Patient presents with   Anxiety   Hypothyroidism    Patient says she would like to discuss with provider some issues and other things she has been feelings in regards to her Thyroid.    HYPOTHYROIDISM Thyroid control status:unsure Satisfied with current treatment? no Medication side effects: no Medication compliance: excellent compliance Recent dose adjustment:no Fatigue: yes Cold intolerance: no Heat intolerance: no Weight gain: yes Weight loss: no Constipation: yes Diarrhea/loose stools: no Palpitations: no Lower extremity edema: no Anxiety/depressed mood: yes  ANXIETY/STRESS Duration: chronic Status:stable Anxious mood: yes  Excessive worrying: no Irritability: no  Sweating: no Nausea: no Palpitations:no Hyperventilation: no Panic attacks: no Agoraphobia: no  Obscessions/compulsions: no Depressed mood: no    04/05/2023    9:06 AM 09/29/2022    8:45 AM 09/13/2021    8:18 AM 09/10/2020    8:01 AM 08/26/2019    8:37 AM  Depression screen PHQ 2/9  Decreased Interest 0 0 0 0 0  Down, Depressed, Hopeless 0 0 0 0 0  PHQ - 2 Score 0 0 0 0 0  Altered sleeping 0 0 0 0 0  Tired, decreased energy 1 0 0 0 0  Change in appetite 0 0 0 0 0  Feeling bad or failure about yourself  0 0 0 0 0  Trouble concentrating 0 0 0 0 0  Moving slowly or fidgety/restless 0 0 0 0 0  Suicidal thoughts 0 0 0 0 0  PHQ-9 Score 1 0 0 0 0  Difficult doing work/chores Not difficult at all Not difficult at all  Not difficult at all Not difficult at all   Anhedonia: no Weight changes: no Insomnia: no   Hypersomnia: no Fatigue/loss of energy: yes Feelings of worthlessness: no Feelings of guilt:  no Impaired concentration/indecisiveness: no Suicidal ideations: no  Crying spells: no Recent Stressors/Life Changes: no   Relationship problems: no   Family stress: no     Financial stress: no    Job stress: no    Recent death/loss: no   Relevant past medical, surgical, family and social history reviewed and updated as indicated. Interim medical history since our last visit reviewed. Allergies and medications reviewed and updated.  Review of Systems  Constitutional: Negative.   Respiratory: Negative.    Cardiovascular: Negative.   Gastrointestinal: Negative.   Musculoskeletal:  Positive for myalgias, neck pain and neck stiffness. Negative for arthralgias, back pain, gait problem and joint swelling.  Neurological: Negative.   Psychiatric/Behavioral:  Negative for agitation, behavioral problems, confusion, decreased concentration, dysphoric mood, hallucinations, self-injury, sleep disturbance and suicidal ideas. The patient is nervous/anxious. The patient is not hyperactive.     Per HPI unless specifically indicated above     Objective:    BP 136/82   Pulse 86   Temp 98.5 F (36.9 C) (Oral)   Ht 4' 11.5" (1.511 m)   Wt 192 lb 14.4 oz (87.5 kg)   SpO2 98%   BMI 38.31 kg/m   Wt Readings from Last 3 Encounters:  04/05/23 192 lb 14.4 oz (87.5 kg)  09/29/22 186 lb (84.4 kg)  03/22/22 192 lb 6.4 oz (87.3 kg)    Physical Exam Vitals and nursing note reviewed.  Constitutional:      General: She is not in acute distress.    Appearance: Normal appearance. She is obese. She is not ill-appearing, toxic-appearing or diaphoretic.  HENT:     Head: Normocephalic and atraumatic.     Right Ear: External ear normal.     Left Ear: External ear normal.     Nose: Nose normal.     Mouth/Throat:     Mouth: Mucous membranes are moist.     Pharynx: Oropharynx is clear.  Eyes:     General: No scleral icterus.       Right eye: No discharge.        Left eye: No discharge.      Extraocular Movements: Extraocular movements intact.     Conjunctiva/sclera: Conjunctivae normal.     Pupils: Pupils are equal, round, and reactive to light.  Cardiovascular:     Rate and Rhythm: Normal rate and regular rhythm.     Pulses: Normal pulses.     Heart sounds: Normal heart sounds. No murmur heard.    No friction rub. No gallop.  Pulmonary:     Effort: Pulmonary effort is normal. No respiratory distress.     Breath sounds: Normal breath sounds. No stridor. No wheezing, rhonchi or rales.  Chest:     Chest wall: No tenderness.  Musculoskeletal:        General: Normal range of motion.     Cervical back: Normal range of motion and neck supple.  Skin:    General: Skin is warm and dry.     Capillary Refill: Capillary refill takes less than 2 seconds.     Coloration: Skin is not jaundiced or pale.     Findings: No bruising, erythema, lesion or rash.  Neurological:     General: No focal deficit present.     Mental Status: She is alert and oriented to person, place, and time. Mental status is at baseline.  Psychiatric:        Mood and Affect: Mood normal.        Behavior: Behavior normal.        Thought Content: Thought content normal.        Judgment: Judgment normal.     Results for orders placed or performed in visit on 09/29/22  CBC with Differential/Platelet  Result Value Ref Range   WBC 8.6 3.4 - 10.8 x10E3/uL   RBC 4.67 3.77 - 5.28 x10E6/uL   Hemoglobin 14.0 11.1 - 15.9 g/dL   Hematocrit 78.2 95.6 - 46.6 %   MCV 91 79 - 97 fL   MCH 30.0 26.6 - 33.0 pg   MCHC 33.1 31.5 - 35.7 g/dL   RDW 21.3 08.6 - 57.8 %   Platelets 335 150 - 450 x10E3/uL   Neutrophils 72 Not Estab. %   Lymphs 14 Not Estab. %   Monocytes 7 Not Estab. %   Eos 6 Not Estab. %   Basos 1 Not Estab. %   Neutrophils Absolute 6.1 1.4 - 7.0 x10E3/uL   Lymphocytes Absolute 1.2 0.7 - 3.1 x10E3/uL   Monocytes Absolute 0.6 0.1 - 0.9 x10E3/uL   EOS (ABSOLUTE) 0.5 (H) 0.0 - 0.4 x10E3/uL   Basophils  Absolute 0.1 0.0 - 0.2 x10E3/uL   Immature Granulocytes 0 Not Estab. %   Immature Grans (Abs) 0.0 0.0 - 0.1 x10E3/uL  Comprehensive  metabolic panel  Result Value Ref Range   Glucose 86 70 - 99 mg/dL   BUN 7 6 - 24 mg/dL   Creatinine, Ser 1.61 0.57 - 1.00 mg/dL   eGFR 82 >09 UE/AVW/0.98   BUN/Creatinine Ratio 8 (L) 9 - 23   Sodium 139 134 - 144 mmol/L   Potassium 3.9 3.5 - 5.2 mmol/L   Chloride 101 96 - 106 mmol/L   CO2 26 20 - 29 mmol/L   Calcium 9.4 8.7 - 10.2 mg/dL   Total Protein 7.3 6.0 - 8.5 g/dL   Albumin 4.4 3.9 - 4.9 g/dL   Globulin, Total 2.9 1.5 - 4.5 g/dL   Albumin/Globulin Ratio 1.5 1.2 - 2.2   Bilirubin Total 0.8 0.0 - 1.2 mg/dL   Alkaline Phosphatase 93 44 - 121 IU/L   AST 16 0 - 40 IU/L   ALT 12 0 - 32 IU/L  Lipid Panel w/o Chol/HDL Ratio  Result Value Ref Range   Cholesterol, Total 181 100 - 199 mg/dL   Triglycerides 119 (H) 0 - 149 mg/dL   HDL 52 >14 mg/dL   VLDL Cholesterol Cal 33 5 - 40 mg/dL   LDL Chol Calc (NIH) 96 0 - 99 mg/dL  Urinalysis, Routine w reflex microscopic  Result Value Ref Range   Specific Gravity, UA 1.010 1.005 - 1.030   pH, UA 6.5 5.0 - 7.5   Color, UA Yellow Yellow   Appearance Ur Clear Clear   Leukocytes,UA Negative Negative   Protein,UA Negative Negative/Trace   Glucose, UA Negative Negative   Ketones, UA Negative Negative   RBC, UA Negative Negative   Bilirubin, UA Negative Negative   Urobilinogen, Ur 0.2 0.2 - 1.0 mg/dL   Nitrite, UA Negative Negative   Microscopic Examination Comment   TSH  Result Value Ref Range   TSH 1.450 0.450 - 4.500 uIU/mL  VITAMIN D 25 Hydroxy (Vit-D Deficiency, Fractures)  Result Value Ref Range   Vit D, 25-Hydroxy 27.7 (L) 30.0 - 100.0 ng/mL  Hepatitis B surface antibody,quantitative  Result Value Ref Range   Hepatitis B Surf Ab Quant 172.4 Immunity>9.9 mIU/mL  Hepatitis A antibody, IgM  Result Value Ref Range   Hep A IgM Negative Negative      Assessment & Plan:   Problem List Items  Addressed This Visit       Endocrine   Hypothyroid    Rechecking labs today. Await results. Treat as needed.       Relevant Orders   TSH     Other   Anxiety disorder - Primary    Under good control on current regimen. Continue current regimen. Continue to monitor. Call with any concerns. Refills given. Xanax should last 6 months.      Relevant Medications   ALPRAZolam (XANAX) 0.25 MG tablet   mirtazapine (REMERON) 15 MG tablet   Vitamin D deficiency    Rechecking labs today. Await results. Treat as needed.       Relevant Orders   VITAMIN D 25 Hydroxy (Vit-D Deficiency, Fractures)     Follow up plan: Return in about 6 months (around 10/06/2023) for physical.

## 2023-04-05 NOTE — Assessment & Plan Note (Signed)
Under good control on current regimen. Continue current regimen. Continue to monitor. Call with any concerns. Refills given. Xanax should last 6 months.

## 2023-04-06 LAB — VITAMIN D 25 HYDROXY (VIT D DEFICIENCY, FRACTURES): Vit D, 25-Hydroxy: 22.7 ng/mL — ABNORMAL LOW (ref 30.0–100.0)

## 2023-04-06 LAB — TSH: TSH: 0.807 u[IU]/mL (ref 0.450–4.500)

## 2023-04-09 ENCOUNTER — Ambulatory Visit: Payer: BC Managed Care – PPO | Admitting: Family Medicine

## 2023-04-09 ENCOUNTER — Other Ambulatory Visit: Payer: Self-pay | Admitting: Family Medicine

## 2023-04-09 MED ORDER — VITAMIN D (ERGOCALCIFEROL) 1.25 MG (50000 UNIT) PO CAPS: 50000.0000 [IU] | ORAL_CAPSULE | ORAL | 1 refills | Status: DC

## 2023-04-09 MED ORDER — LEVOTHYROXINE SODIUM 75 MCG PO TABS: ORAL_TABLET | ORAL | 3 refills | Status: DC

## 2023-05-10 ENCOUNTER — Telehealth: Payer: Self-pay

## 2023-05-10 NOTE — Telephone Encounter (Signed)
Patient called to reschedule her August appt with Dr Roseanne Reno until Oct 22, patient will call here if she need refills of her rosacea topicals before her Oct appt, pt was last here 12/25/22

## 2023-07-03 ENCOUNTER — Ambulatory Visit: Payer: BC Managed Care – PPO | Admitting: Dermatology

## 2023-07-31 ENCOUNTER — Other Ambulatory Visit: Payer: Self-pay | Admitting: Family Medicine

## 2023-08-01 NOTE — Telephone Encounter (Signed)
Requested medication (s) are due for refill today: yes  Requested medication (s) are on the active medication list: yes  Last refill:  04/05/23  Future visit scheduled: yes  Notes to clinic:  Unable to refill per protocol, cannot delegate.      Requested Prescriptions  Pending Prescriptions Disp Refills   ALPRAZolam (XANAX) 0.25 MG tablet [Pharmacy Med Name: ALPRAZOLAM 0.25MG  TABLETS] 30 tablet     Sig: TAKE 1/2 TABLET BY MOUTH DAILY AT NIGHT AS NEEDED FOR ANXIETY OR SLEEP     Not Delegated - Psychiatry: Anxiolytics/Hypnotics 2 Failed - 07/31/2023  9:09 AM      Failed - This refill cannot be delegated      Failed - Urine Drug Screen completed in last 360 days      Passed - Patient is not pregnant      Passed - Valid encounter within last 6 months    Recent Outpatient Visits           3 months ago Panic disorder without agoraphobia   [place] [facility] [place], Megan P, DO   10 months ago Routine general medical examination at a health care facility   [facility] [place], [place] P, DO   1 year ago Upper respiratory tract infection, unspecified type   [place] [facility] [place], Megan P, DO   1 year ago Panic disorder without agoraphobia   [place] [facility] [place], Megan P, DO   1 year ago Routine general medical examination at a health care facility   [facility] Dorcas Carrow, DO       Future Appointments             In 1 month Willeen Niece, MD [facility]   In 2 months Dorcas Carrow, DO [place] [facility]

## 2023-08-20 ENCOUNTER — Other Ambulatory Visit: Payer: Self-pay | Admitting: Dermatology

## 2023-08-20 DIAGNOSIS — L304 Erythema intertrigo: Secondary | ICD-10-CM

## 2023-09-11 ENCOUNTER — Ambulatory Visit: Payer: BC Managed Care – PPO | Admitting: Dermatology

## 2023-09-11 DIAGNOSIS — L304 Erythema intertrigo: Secondary | ICD-10-CM | POA: Diagnosis not present

## 2023-09-11 DIAGNOSIS — L219 Seborrheic dermatitis, unspecified: Secondary | ICD-10-CM

## 2023-09-11 DIAGNOSIS — L719 Rosacea, unspecified: Secondary | ICD-10-CM | POA: Diagnosis not present

## 2023-09-11 DIAGNOSIS — Z7189 Other specified counseling: Secondary | ICD-10-CM | POA: Diagnosis not present

## 2023-09-11 MED ORDER — IVERMECTIN 1 % EX CREA
TOPICAL_CREAM | CUTANEOUS | 11 refills | Status: DC
Start: 2023-09-11 — End: 2024-03-11

## 2023-09-11 MED ORDER — MOMETASONE FUROATE 0.1 % EX SOLN
CUTANEOUS | 3 refills | Status: DC
Start: 2023-09-11 — End: 2024-04-03

## 2023-09-11 NOTE — Progress Notes (Signed)
Follow-Up Visit   Subjective  Sherry Gonzalez is a 52 y.o. female who presents for the following: Rosacea, uses Soolantra. Patient reports had BBL treatment in June 2024, bbl treatment in nov 21 Hx of seborrheic dermatitis at scalp using H&S clinical strength and mometasone lotion as needed, hx of intertrigo, using ketoconazole cream which helps    The following portions of the chart were reviewed this encounter and updated as appropriate: medications, allergies, medical history  Review of Systems:  No other skin or systemic complaints except as noted in HPI or Assessment and Plan.  Objective  Well appearing patient in no apparent distress; mood and affect are within normal limits.  Areas Examined: Face, scalp  Relevant physical exam findings are noted in the Assessment and Plan.    Assessment & Plan   Rosacea  Related Medications Ivermectin (SOOLANTRA) 1 % CREA Apply to face at bedtime.  Seborrheic dermatitis  Related Medications Clobetasol Propionate 0.05 % shampoo Apply to dry scalp twice weekly, let sit for 5 minutes, then massage and rinse in shower. When improved, decrease to once weekly.  mometasone (ELOCON) 0.1 % lotion Apply 1-2 drops to affected areas scalp once to twice daily as needed for itch. Avoid face, groin, underarms.    ROSACEA  Exam:  Mid face erythema with telangiectasias  Chronic and persistent condition with duration or expected duration over one year. Condition is improving with treatment but not currently at goal.   Rosacea is a chronic progressive skin condition usually affecting the face of adults, causing redness and/or acne bumps. It is treatable but not curable. It sometimes affects the eyes (ocular rosacea) as well. It may respond to topical and/or systemic medication and can flare with stress, sun exposure, alcohol, exercise, topical steroids (including hydrocortisone/cortisone 10) and some foods.  Daily application of broad spectrum spf  30+ sunscreen to face is recommended to reduce flares.  Treatment Plan Continue Soolantra Cream Apply to face QHS.  Continue BBL treatments (patient treated in [facility] ) Continue daily sunscreen  Counseling for BBL / IPL / Laser and Coordination of Care Discussed the treatment option of Broad Band Light (BBL) Sherry Gonzalez Pulsed Light (IPL)/ Laser for skin discoloration, including brown spots and redness.  Typically we recommend at least 1-3 treatment sessions about 5-8 weeks apart for best results.  Cannot have tanned skin when BBL performed, and regular use of sunscreen/photoprotection is advised after the procedure to help maintain results. The patient's condition may also require "maintenance treatments" in the future.  The fee for BBL / laser treatments is $350 per treatment session for the whole face.  A fee can be quoted for other parts of the body.  Insurance typically does not pay for BBL/laser treatments and therefore the fee is an out-of-pocket cost. Recommend prophylactic valtrex treatment. Once scheduled for procedure, will send Rx in prior to patient's appointment.     Seborrheic dermatitis Scalp  Exam: mild scaling in scalp   Chronic condition with duration or expected duration over one year. Currently well-controlled.    Seborrheic Dermatitis  -  is a chronic persistent rash characterized by pinkness and scaling most commonly of the mid face but also can occur on the scalp (dandruff), ears; mid chest, mid back and groin.  It tends to be exacerbated by stress and cooler weather.  People who have neurologic disease may experience new onset or exacerbation of existing seborrheic dermatitis.  The condition is not curable but treatable and can be controlled.  Treatment Plan:   D/C clobetasol shampoo- no longer covered by insurance Continue head and shoulders clinical strength shampoo  Continue mometasone lotion Apply 1-2 gtts to AA scalp QD/BID prn itch. Avoid face, groin,  axilla.   Topical steroids (such as triamcinolone, fluocinolone, fluocinonide, mometasone, clobetasol, halobetasol, betamethasone, hydrocortisone) can cause thinning and lightening of the skin if they are used for too long in the same area. Your physician has selected the right strength medicine for your problem and area affected on the body. Please use your medication only as directed by your physician to prevent side effects.   Intertrigo Exam: lower abdomen, inguinal creases with mild erythema  Chronic condition with duration or expected duration over one year. Currently well-controlled.    Intertrigo is a chronic recurrent rash that occurs in skin fold areas that may be associated with friction; heat; moisture; yeast; fungus; and bacteria.  It is exacerbated by increased movement / activity; sweating; and higher atmospheric temperature.   Treatment Plan:  Continue as needed  Ketoconazole 2% Cream Apply to skin folds QHS dsp 60g 5Rf. Continue as needed Zeasorb AF Powder Apply to skin folds every morning and prior to exercise.   Return in about 6 months (around 03/11/2024) for rosacea/intertrigo/seb derm.  I, Asher Muir, CMA, am acting as scribe for Willeen Niece, MD.   Documentation: I have reviewed the above documentation for accuracy and completeness, and I agree with the above.  Willeen Niece, MD

## 2023-09-11 NOTE — Patient Instructions (Addendum)
For rosacea   Continue soolantra nightly Continue BBL  Continue sunscreen daily    For Intertrigo Lower abdomen, inguinal creases  Continue Ketoconazole 2% Cream Apply to skin folds nightly  dsp 60g 5Rf. Continue Zeasorb AF Powder Apply to skin folds every morning and prior to exercise.   For seborrheic dermatitis at scalp   a chronic persistent rash characterized by pinkness and scaling most commonly of the mid face but also can occur on the scalp (dandruff), ears; mid chest, mid back and groin.  It tends to be exacerbated by stress and cooler weather.  People who have neurologic disease may experience new onset or exacerbation of existing seborrheic dermatitis.  The condition is not curable but treatable and can be controlled  Continue Head and Shoulders Clinical Strength   Continue mometasone lotion apply 1 - 2 drop to affected areas of scalp daily to twice daily as needed for itch. Avoid face, groin, and axilla   Can use ketoconazole 2 % cream to dry areas at face       Due to recent changes in healthcare laws, you may see results of your pathology and/or laboratory studies on MyChart before the doctors have had a chance to review them. We understand that in some cases there may be results that are confusing or concerning to you. Please understand that not all results are received at the same time and often the doctors may need to interpret multiple results in order to provide you with the best plan of care or course of treatment. Therefore, we ask that you please give [place] 2 business days to thoroughly review all your results before contacting the office for clarification. Should we see a critical lab result, you will be contacted sooner.   If You Need Anything After Your Visit  If you have any questions or concerns for your doctor, please call our main line at [phone] and press option 4 to reach your doctor's medical assistant. If no one answers, please leave a voicemail as  directed and we will return your call as soon as possible. Messages left after 4 pm will be answered the following business day.   You may also send [place] a message via MyChart. We typically respond to MyChart messages within 1-2 business days.  For prescription refills, please ask your pharmacy to contact our office. Our fax number is [phone].  If you have an urgent issue when the clinic is closed that cannot wait until the next business day, you can page your doctor at the number below.    Please note that while we do our best to be available for urgent issues outside of office hours, we are not available 24/7.   If you have an urgent issue and are unable to reach [place], you may choose to seek medical care at your doctor's office, retail clinic, urgent care center, or emergency room.  If you have a medical emergency, please immediately call 911 or go to the emergency department.  Pager Numbers  - Dr. Gwen Pounds: [phone]  - Dr. Roseanne Reno: [phone]  - Dr. Katrinka Blazing: [phone]   In the event of inclement weather, please call our main line at [phone] for an update on the status of any delays or closures.  Dermatology Medication Tips: Please keep the boxes that topical medications come in in order to help keep track of the instructions about where and how to use these. Pharmacies typically print the medication instructions only on the boxes and not directly on  the medication tubes.   If your medication is too expensive, please contact our office at [phone] option 4 or send [place] a message through MyChart.   We are unable to tell what your co-pay for medications will be in advance as this is different depending on your insurance coverage. However, we may be able to find a substitute medication at lower cost or fill out paperwork to get insurance to cover a needed medication.   If a prior authorization is required to get your medication covered by your insurance company, please  allow [place] 1-2 business days to complete this process.  Drug prices often vary depending on where the prescription is filled and some pharmacies may offer cheaper prices.  The website www.goodrx.com contains coupons for medications through different pharmacies. The prices here do not account for what the cost may be with help from insurance (it may be cheaper with your insurance), but the website can give you the price if you did not use any insurance.  - You can print the associated coupon and take it with your prescription to the pharmacy.  - You may also stop by our office during regular business hours and pick up a GoodRx coupon card.  - If you need your prescription sent electronically to a different pharmacy, notify our office through [facility] or by phone at [phone] option 4.     Si Usted Necesita Algo Despus de Su Visita  Tambin puede enviarnos un mensaje a travs de Clinical cytogeneticist. Por lo general respondemos a los mensajes de MyChart en el transcurso de 1 a 2 das hbiles.  Para renovar recetas, por favor pida a su farmacia que se ponga en contacto con nuestra oficina. Annie Sable de fax es [place] [phone].  Si tiene un asunto urgente cuando la clnica est cerrada y que no puede esperar hasta el siguiente da hbil, puede llamar/localizar a su doctor(a) al nmero que aparece a continuacin.   Por favor, tenga en cuenta que aunque hacemos todo lo posible para estar disponibles para asuntos urgentes fuera del horario de [place], no estamos disponibles las 24 horas del da, los 7 [address]  [address] de la [place].   Si tiene un problema urgente y no puede comunicarse con nosotros, puede optar por buscar atencin mdica  en el consultorio de su doctor(a), en una clnica privada, en un centro de atencin urgente o en una sala de emergencias.  Si tiene Engineer, drilling, por favor llame inmediatamente al 911 o vaya a la sala de emergencias.  Nmeros de bper  - Dr. Gwen Pounds:  [phone]  - Dra. Roseanne Reno: [phone]  - Dr. Katrinka Blazing: [phone]   En caso de inclemencias del tiempo, por favor llame a Lacy Duverney principal al [phone] para una actualizacin sobre el [place] de cualquier retraso o cierre.  Consejos para la medicacin en dermatologa: Por favor, guarde las cajas en las que vienen los medicamentos de uso tpico para ayudarle a seguir las instrucciones sobre dnde y cmo usarlos. Las farmacias generalmente imprimen las instrucciones del medicamento slo en las cajas y no directamente en los tubos del [place].   Si su medicamento es muy caro, por favor, pngase en contacto con Rolm Gala llamando al [phone] y presione la opcin 4 o envenos un mensaje a travs de Clinical cytogeneticist.   No podemos decirle cul ser su copago por los medicamentos por adelantado ya que esto es diferente dependiendo de la cobertura de su seguro. Sin embargo, es posible que podamos encontrar un medicamento sustituto  a Audiological scientist un formulario para que el seguro cubra el medicamento que se considera necesario.   Si se requiere una autorizacin previa para que su compaa de seguros [place] su medicamento, por favor permtanos de 1 a 2 das hbiles para completar [address]  Los precios de los medicamentos varan con frecuencia dependiendo del Environmental consultant de dnde se surte la receta y alguna farmacias pueden ofrecer precios ms baratos.  El sitio web www.goodrx.com tiene cupones para medicamentos de Health and safety inspector. Los precios aqu no tienen en cuenta lo que podra costar con la ayuda del seguro (puede ser ms barato con su seguro), pero el sitio web puede darle el precio si no utiliz Tourist information centre manager.  - Puede imprimir el cupn correspondiente y llevarlo con su receta a la farmacia.  - Tambin puede pasar por nuestra oficina durante el horario de atencin regular y Education officer, museum una tarjeta de cupones de GoodRx.  - Si necesita que su receta se enve electrnicamente a  una farmacia diferente, informe a nuestra oficina a travs de MyChart de [place] o por telfono llamando al [phone] y presione la opcin 4.

## 2023-10-05 ENCOUNTER — Encounter: Payer: Self-pay | Admitting: Family Medicine

## 2023-10-05 ENCOUNTER — Ambulatory Visit (INDEPENDENT_AMBULATORY_CARE_PROVIDER_SITE_OTHER): Payer: BC Managed Care – PPO | Admitting: Family Medicine

## 2023-10-05 VITALS — BP 128/84 | HR 79 | Temp 98.4°F | Ht 59.0 in | Wt 192.4 lb

## 2023-10-05 DIAGNOSIS — E559 Vitamin D deficiency, unspecified: Secondary | ICD-10-CM

## 2023-10-05 DIAGNOSIS — Z0000 Encounter for general adult medical examination without abnormal findings: Secondary | ICD-10-CM | POA: Diagnosis not present

## 2023-10-05 DIAGNOSIS — E039 Hypothyroidism, unspecified: Secondary | ICD-10-CM | POA: Diagnosis not present

## 2023-10-05 DIAGNOSIS — Z1231 Encounter for screening mammogram for malignant neoplasm of breast: Secondary | ICD-10-CM

## 2023-10-05 DIAGNOSIS — Z23 Encounter for immunization: Secondary | ICD-10-CM

## 2023-10-05 DIAGNOSIS — E785 Hyperlipidemia, unspecified: Secondary | ICD-10-CM | POA: Diagnosis not present

## 2023-10-05 DIAGNOSIS — F410 Panic disorder [episodic paroxysmal anxiety] without agoraphobia: Secondary | ICD-10-CM | POA: Diagnosis not present

## 2023-10-05 MED ORDER — BUDESONIDE-FORMOTEROL FUMARATE 160-4.5 MCG/ACT IN AERO: 2.0000 | INHALATION_SPRAY | Freq: Two times a day (BID) | RESPIRATORY_TRACT | 3 refills | Status: AC

## 2023-10-05 MED ORDER — CLOTRIMAZOLE-BETAMETHASONE 1-0.05 % EX CREA: 1.0000 | TOPICAL_CREAM | Freq: Every day | CUTANEOUS | 3 refills | Status: DC

## 2023-10-05 MED ORDER — ALBUTEROL SULFATE HFA 108 (90 BASE) MCG/ACT IN AERS: 2.0000 | INHALATION_SPRAY | Freq: Four times a day (QID) | RESPIRATORY_TRACT | 2 refills | Status: DC | PRN

## 2023-10-05 MED ORDER — ALPRAZOLAM 0.25 MG PO TABS: ORAL_TABLET | ORAL | 0 refills | Status: DC

## 2023-10-05 MED ORDER — MIRTAZAPINE 15 MG PO TABS: 15.0000 mg | ORAL_TABLET | Freq: Every day | ORAL | 1 refills | Status: DC

## 2023-10-05 NOTE — Patient Instructions (Signed)
Please call to schedule your mammogram and/or bone density: [facility]  Address: [address], [place], [place] [address] Phone: [phone]  [facility] [place] [address] [place],  [place]  [address] Phone: [phone]   

## 2023-10-05 NOTE — Assessment & Plan Note (Signed)
Rechecking labs today. Await results. Treat as needed.  °

## 2023-10-05 NOTE — Assessment & Plan Note (Signed)
Under good control on current regimen. Continue current regimen. Continue to monitor. Call with any concerns. Refills given.   

## 2023-10-05 NOTE — Progress Notes (Signed)
BP 128/84   Pulse 79   Temp 98.4 F (36.9 C) (Oral)   Ht 4\' 11"  (1.499 m)   Wt 192 lb 6.4 oz (87.3 kg)   SpO2 98%   BMI 38.86 kg/m    Subjective:    Patient ID: Sherry Gonzalez, female    DOB: 1971-08-13, 52 y.o.   MRN: [id number]  HPI: Sherry Gonzalez is a 52 y.o. female presenting on 10/05/2023 for comprehensive medical examination. Current medical complaints include:  HYPOTHYROIDISM Thyroid control status:controlled Satisfied with current treatment? yes Medication side effects: no Medication compliance: excellent compliance Recent dose adjustment:no Fatigue: no Cold intolerance: no Heat intolerance: no Weight gain: no Weight loss: no Constipation: no Diarrhea/loose stools: no Palpitations: no Lower extremity edema: no Anxiety/depressed mood: yes  ANXIETY/STRESS- has about a week where she was feeling more anxious, but feels better now Duration: chronic Status:stable Anxious mood: yes  Excessive worrying: no Irritability: no  Sweating: no Nausea: no Palpitations:no Hyperventilation: no Panic attacks: no Agoraphobia: no  Obscessions/compulsions: no Depressed mood: no    10/05/2023    8:21 AM 04/05/2023    9:06 AM 09/29/2022    8:45 AM 09/13/2021    8:18 AM 09/10/2020    8:01 AM  Depression screen PHQ 2/9  Decreased Interest 0 0 0 0 0  Down, Depressed, Hopeless 0 0 0 0 0  PHQ - 2 Score 0 0 0 0 0  Altered sleeping 0 0 0 0 0  Tired, decreased energy 0 1 0 0 0  Change in appetite 0 0 0 0 0  Feeling bad or failure about yourself  0 0 0 0 0  Trouble concentrating 0 0 0 0 0  Moving slowly or fidgety/restless 0 0 0 0 0  Suicidal thoughts 0 0 0 0 0  PHQ-9 Score 0 1 0 0 0  Difficult doing work/chores Not difficult at all Not difficult at all Not difficult at all  Not difficult at all   Anhedonia: no Weight changes: no Insomnia: no   Hypersomnia: no Fatigue/loss of energy: no Feelings of worthlessness: no Feelings of guilt: no Impaired  concentration/indecisiveness: no Suicidal ideations: no  Crying spells: no Recent Stressors/Life Changes: no   Relationship problems: no   Family stress: no     Financial stress: no    Job stress: no    Recent death/loss: no  Menopausal Symptoms: no  Depression Screen done today and results listed below:     10/05/2023    8:21 AM 04/05/2023    9:06 AM 09/29/2022    8:45 AM 09/13/2021    8:18 AM 09/10/2020    8:01 AM  Depression screen PHQ 2/9  Decreased Interest 0 0 0 0 0  Down, Depressed, Hopeless 0 0 0 0 0  PHQ - 2 Score 0 0 0 0 0  Altered sleeping 0 0 0 0 0  Tired, decreased energy 0 1 0 0 0  Change in appetite 0 0 0 0 0  Feeling bad or failure about yourself  0 0 0 0 0  Trouble concentrating 0 0 0 0 0  Moving slowly or fidgety/restless 0 0 0 0 0  Suicidal thoughts 0 0 0 0 0  PHQ-9 Score 0 1 0 0 0  Difficult doing work/chores Not difficult at all Not difficult at all Not difficult at all  Not difficult at all    Past Medical History:  Past Medical History:  Diagnosis Date   Allergy-induced asthma  Carpal tunnel syndrome, left    left upper limbt   Carpal tunnel syndrome, right    right upper limb   Eustachian tube disorder    left ear   Hand eczema    Hypothyroidism    Mild intermittent asthma    Trigger ring finger of left hand     Surgical History:  Past Surgical History:  Procedure Laterality Date   COLONOSCOPY WITH PROPOFOL N/A 10/11/2020   Procedure: COLONOSCOPY WITH PROPOFOL;  Surgeon: Midge Minium, MD;  Location: [facility];  Service: Endoscopy;  Laterality: N/A;   WISDOM TOOTH EXTRACTION      Medications:  Current Outpatient Medications on File Prior to Visit  Medication Sig   acetaminophen (TYLENOL) 325 MG tablet Take 325 mg by mouth as needed.   Cetirizine-Pseudoephedrine (ZYRTEC-D PO) Take by mouth.   Clobetasol Propionate 0.05 % shampoo Apply to dry scalp twice weekly, let sit for 5 minutes, then massage and rinse in shower.  When improved, decrease to once weekly.   Ivermectin (SOOLANTRA) 1 % CREA Apply to face at bedtime.   ketoconazole (NIZORAL) 2 % cream APPLY TOPICALLY TO THE SKIN FOLDS EVERY NIGHT FOR RASH   levothyroxine (SYNTHROID) 75 MCG tablet TAKE 1 TABLET(75 MCG) BY MOUTH DAILY BEFORE BREAKFAST   mometasone (ELOCON) 0.1 % lotion Apply 1-2 drops to affected areas scalp once to twice daily as needed for itch. Avoid face, groin, underarms.   PREVIDENT 5000 PLUS 1.1 % CREA dental cream Take by mouth daily.   VITAMIN D PO Take by mouth.   Vitamin D, Ergocalciferol, (DRISDOL) 1.25 MG (50000 UNIT) CAPS capsule Take 1 capsule (50,000 Units total) by mouth every 7 (seven) days.   No current facility-administered medications on file prior to visit.    Allergies:  No Known Allergies  Social History:  Social History   Socioeconomic History   Marital status: Single    Spouse name: Not on file   Number of children: Not on file   Years of education: Not on file   Highest education level: Master's degree (e.g., MA, MS, MEng, MEd, MSW, MBA)  Occupational History   Not on file  Tobacco Use   Smoking status: Never   Smokeless tobacco: Never  Vaping Use   Vaping status: Never Used  Substance and Sexual Activity   Alcohol use: No    Alcohol/week: 0.0 standard drinks of alcohol   Drug use: No   Sexual activity: Never  Other Topics Concern   Not on file  Social History Narrative   Not on file   Social Determinants of Health   Financial Resource Strain: Low Risk  (04/05/2023)   Overall Financial Resource Strain (CARDIA)    Difficulty of Paying Living Expenses: Not hard at all  Food Insecurity: No Food Insecurity (04/05/2023)   Hunger Vital Sign    Worried About Running Out of Food in the Last Year: Never true    Ran Out of Food in the Last Year: Never true  Transportation Needs: No Transportation Needs (04/05/2023)   PRAPARE - Administrator, Civil Service (Medical): No    Lack of  Transportation (Non-Medical): No  Physical Activity: Insufficiently Active (04/05/2023)   Exercise Vital Sign    Days of Exercise per Week: 3 days    Minutes of Exercise per Session: 20 min  Stress: Not on file  Social Connections: Moderately Integrated (04/05/2023)   Social Connection and Isolation Panel [NHANES]    Frequency of Communication with  Friends and Family: More than three times a week    Frequency of Social Gatherings with Friends and Family: Once a week    Attends Religious Services: More than 4 times per year    Active Member of [employer] or Organizations: Yes    Attends Engineer, structural: More than 4 times per year    Marital Status: Never married  Catering manager Violence: Not on file   Social History   Tobacco Use  Smoking Status Never  Smokeless Tobacco Never   Social History   Substance and Sexual Activity  Alcohol Use No   Alcohol/week: 0.0 standard drinks of alcohol    Family History:  Family History  Problem Relation Age of Onset   Alcohol abuse Father    Ehlers-Danlos syndrome Mother        Mothers side of the family   Migraines Sister    Breast cancer Neg Hx     Past medical history, surgical history, medications, allergies, family history and social history reviewed with patient today and changes made to appropriate areas of the chart.   Review of Systems  Constitutional: Negative.   HENT: Negative.    Eyes: Negative.   Respiratory: Negative.    Cardiovascular: Negative.   Gastrointestinal:  Positive for constipation. Negative for abdominal pain, blood in stool, diarrhea, heartburn, melena, nausea and vomiting.  Genitourinary: Negative.   Musculoskeletal: Negative.   Skin: Negative.   Neurological: Negative.   Endo/Heme/Allergies: Negative.   Psychiatric/Behavioral:  Negative for depression, hallucinations, memory loss, substance abuse and suicidal ideas. The patient is nervous/anxious. The patient does not have insomnia.    All  other ROS negative except what is listed above and in the HPI.      Objective:    BP 128/84   Pulse 79   Temp 98.4 F (36.9 C) (Oral)   Ht 4\' 11"  (1.499 m)   Wt 192 lb 6.4 oz (87.3 kg)   SpO2 98%   BMI 38.86 kg/m   Wt Readings from Last 3 Encounters:  10/05/23 192 lb 6.4 oz (87.3 kg)  04/05/23 192 lb 14.4 oz (87.5 kg)  09/29/22 186 lb (84.4 kg)    Physical Exam Vitals and nursing note reviewed.  Constitutional:      General: She is not in acute distress.    Appearance: Normal appearance. She is obese. She is not ill-appearing, toxic-appearing or diaphoretic.  HENT:     Head: Normocephalic and atraumatic.     Right Ear: Tympanic membrane, ear canal and external ear normal. There is no impacted cerumen.     Left Ear: Tympanic membrane, ear canal and external ear normal. There is no impacted cerumen.     Nose: Nose normal. No congestion or rhinorrhea.     Mouth/Throat:     Mouth: Mucous membranes are moist.     Pharynx: Oropharynx is clear. No oropharyngeal exudate or posterior oropharyngeal erythema.  Eyes:     General: No scleral icterus.       Right eye: No discharge.        Left eye: No discharge.     Extraocular Movements: Extraocular movements intact.     Conjunctiva/sclera: Conjunctivae normal.     Pupils: Pupils are equal, round, and reactive to light.  Neck:     Vascular: No carotid bruit.  Cardiovascular:     Rate and Rhythm: Normal rate and regular rhythm.     Pulses: Normal pulses.     Heart sounds: No murmur  heard.    No friction rub. No gallop.  Pulmonary:     Effort: Pulmonary effort is normal. No respiratory distress.     Breath sounds: Normal breath sounds. No stridor. No wheezing, rhonchi or rales.  Chest:     Chest wall: No tenderness.  Abdominal:     General: Abdomen is flat. Bowel sounds are normal. There is no distension.     Palpations: Abdomen is soft. There is no mass.     Tenderness: There is no abdominal tenderness. There is no right CVA  tenderness, left CVA tenderness, guarding or rebound.     Hernia: No hernia is present.  Genitourinary:    Comments: Breast and pelvic exams deferred with shared decision making Musculoskeletal:        General: No swelling, tenderness, deformity or signs of injury.     Cervical back: Normal range of motion and neck supple. No rigidity. No muscular tenderness.     Right lower leg: No edema.     Left lower leg: No edema.  Lymphadenopathy:     Cervical: No cervical adenopathy.  Skin:    General: Skin is warm and dry.     Capillary Refill: Capillary refill takes less than 2 seconds.     Coloration: Skin is not jaundiced or pale.     Findings: No bruising, erythema, lesion or rash.  Neurological:     General: No focal deficit present.     Mental Status: She is alert and oriented to person, place, and time. Mental status is at baseline.     Cranial Nerves: No cranial nerve deficit.     Sensory: No sensory deficit.     Motor: No weakness.     Coordination: Coordination normal.     Gait: Gait normal.     Deep Tendon Reflexes: Reflexes normal.  Psychiatric:        Mood and Affect: Mood normal.        Behavior: Behavior normal.        Thought Content: Thought content normal.        Judgment: Judgment normal.     Results for orders placed or performed in visit on 04/05/23  VITAMIN D 25 Hydroxy (Vit-D Deficiency, Fractures)  Result Value Ref Range   Vit D, 25-Hydroxy 22.7 (L) 30.0 - 100.0 ng/mL  TSH  Result Value Ref Range   TSH 0.807 0.450 - 4.500 uIU/mL      Assessment & Plan:   Problem List Items Addressed This Visit       Endocrine   Hypothyroid    Rechecking labs today. Await results. Treat as needed.       Relevant Orders   TSH     Other   Anxiety disorder    Under good control on current regimen. Continue current regimen. Continue to monitor. Call with any concerns. Refills given.        Relevant Medications   ALPRAZolam (XANAX) 0.25 MG tablet   mirtazapine  (REMERON) 15 MG tablet   Vitamin D deficiency    Rechecking labs today. Await results. Treat as needed.       Relevant Orders   VITAMIN D 25 Hydroxy (Vit-D Deficiency, Fractures)   Other Visit Diagnoses     Routine general medical examination at a health care facility    -  Primary   Vaccines up to date. Screening labs checked today. Pap, mammo and colonoscopy up to date. Continue diet and exercise. Call with any concerns.  Relevant Orders   CBC with Differential/Platelet   Comprehensive metabolic panel   Lipid Panel w/o Chol/HDL Ratio   TSH   Encounter for screening mammogram for malignant neoplasm of breast       Mammo ordered today.   Relevant Orders   MM 3D SCREENING MAMMOGRAM BILATERAL BREAST        Follow up plan: Return in about 6 months (around 04/03/2024).   LABORATORY TESTING:  - Pap smear: up to date  IMMUNIZATIONS:   - Tdap: Tetanus vaccination status reviewed: last tetanus booster within 10 years. - Influenza: Up to date - Pneumovax: Not applicable - Prevnar: Not applicable - COVID: Refused - HPV: Not applicable - Shingrix vaccine: Administered today  SCREENING: -Mammogram: Ordered today  - Colonoscopy: Up to date   PATIENT COUNSELING:   Advised to take 1 mg of folate supplement per day if capable of pregnancy.   Sexuality: Discussed sexually transmitted diseases, partner selection, use of condoms, avoidance of unintended pregnancy  and contraceptive alternatives.   Advised to avoid cigarette smoking.  I discussed with the patient that most people either abstain from alcohol or drink within safe limits (<=14/week and <=4 drinks/occasion for males, <=7/weeks and <= 3 drinks/occasion for females) and that the risk for alcohol disorders and other health effects rises proportionally with the number of drinks per week and how often a drinker exceeds daily limits.  Discussed cessation/primary prevention of drug use and availability of treatment for abuse.    Diet: Encouraged to adjust caloric intake to maintain  or achieve ideal body weight, to reduce intake of dietary saturated fat and total fat, to limit sodium intake by avoiding high sodium foods and not adding table salt, and to maintain adequate dietary potassium and calcium preferably from fresh fruits, vegetables, and low-fat dairy products.    stressed the importance of regular exercise  Injury prevention: Discussed safety belts, safety helmets, smoke detector, smoking near bedding or upholstery.   Dental health: Discussed importance of regular tooth brushing, flossing, and dental visits.    NEXT PREVENTATIVE PHYSICAL DUE IN 1 YEAR. Return in about 6 months (around 04/03/2024).

## 2023-10-06 LAB — COMPREHENSIVE METABOLIC PANEL
ALT: 17 [IU]/L (ref 0–32)
AST: 21 [IU]/L (ref 0–40)
Albumin: 4.1 g/dL (ref 3.9–4.9)
Alkaline Phosphatase: 89 [IU]/L (ref 44–121)
BUN/Creatinine Ratio: 13 (ref 9–23)
BUN: 10 mg/dL (ref 6–24)
Bilirubin Total: 0.8 mg/dL (ref 0.0–1.2)
CO2: 25 mmol/L (ref 20–29)
Calcium: 9.1 mg/dL (ref 8.7–10.2)
Chloride: 100 mmol/L (ref 96–106)
Creatinine, Ser: 0.8 mg/dL (ref 0.57–1.00)
Globulin, Total: 2.6 g/dL (ref 1.5–4.5)
Glucose: 91 mg/dL (ref 70–99)
Potassium: 3.9 mmol/L (ref 3.5–5.2)
Sodium: 137 mmol/L (ref 134–144)
Total Protein: 6.7 g/dL (ref 6.0–8.5)
eGFR: 90 mL/min/{1.73_m2} (ref >59)

## 2023-10-06 LAB — LIPID PANEL W/O CHOL/HDL RATIO
Cholesterol, Total: 213 mg/dL — ABNORMAL HIGH (ref 100–199)
HDL: 58 mg/dL (ref >39)
LDL Chol Calc (NIH): 133 mg/dL — ABNORMAL HIGH (ref 0–99)
Triglycerides: 124 mg/dL (ref 0–149)
VLDL Cholesterol Cal: 22 mg/dL (ref 5–40)

## 2023-10-06 LAB — CBC WITH DIFFERENTIAL/PLATELET
Basophils Absolute: 0 10*3/uL (ref 0.0–0.2)
Basos: 0 %
EOS (ABSOLUTE): 0.3 10*3/uL (ref 0.0–0.4)
Eos: 3 %
Hematocrit: 41.5 % (ref 34.0–46.6)
Hemoglobin: 13.3 g/dL (ref 11.1–15.9)
Immature Grans (Abs): 0 10*3/uL (ref 0.0–0.1)
Immature Granulocytes: 0 %
Lymphocytes Absolute: 1.3 10*3/uL (ref 0.7–3.1)
Lymphs: 15 %
MCH: 29.2 pg (ref 26.6–33.0)
MCHC: 32 g/dL (ref 31.5–35.7)
MCV: 91 fL (ref 79–97)
Monocytes Absolute: 0.6 10*3/uL (ref 0.1–0.9)
Monocytes: 7 %
Neutrophils Absolute: 6 10*3/uL (ref 1.4–7.0)
Neutrophils: 75 %
Platelets: 300 10*3/uL (ref 150–450)
RBC: 4.55 x10E6/uL (ref 3.77–5.28)
RDW: 13.1 % (ref 11.7–15.4)
WBC: 8.2 10*3/uL (ref 3.4–10.8)

## 2023-10-06 LAB — TSH: TSH: 0.19 u[IU]/mL — ABNORMAL LOW (ref 0.450–4.500)

## 2023-10-06 LAB — VITAMIN D 25 HYDROXY (VIT D DEFICIENCY, FRACTURES): Vit D, 25-Hydroxy: 41.2 ng/mL (ref 30.0–100.0)

## 2023-10-07 ENCOUNTER — Other Ambulatory Visit: Payer: Self-pay | Admitting: Family Medicine

## 2023-10-07 DIAGNOSIS — E039 Hypothyroidism, unspecified: Secondary | ICD-10-CM

## 2023-10-07 MED ORDER — LEVOTHYROXINE SODIUM 50 MCG PO TABS: 50.0000 ug | ORAL_TABLET | Freq: Every day | ORAL | 0 refills | Status: DC

## 2023-10-08 NOTE — Addendum Note (Signed)
Addended by: Dorcas Carrow on: 10/08/2023 04:47 PM   Modules accepted: Orders

## 2023-11-12 ENCOUNTER — Ambulatory Visit
Admission: RE | Admit: 2023-11-12 | Discharge: 2023-11-12 | Disposition: A | Discharge status: Home / Self Care | Payer: BC Managed Care – PPO | Source: Ambulatory Visit | Attending: Family Medicine | Admitting: Family Medicine

## 2023-11-12 DIAGNOSIS — Z1231 Encounter for screening mammogram for malignant neoplasm of breast: Secondary | ICD-10-CM | POA: Diagnosis not present

## 2023-11-19 ENCOUNTER — Other Ambulatory Visit (INDEPENDENT_AMBULATORY_CARE_PROVIDER_SITE_OTHER): Payer: BC Managed Care – PPO

## 2023-11-19 DIAGNOSIS — E785 Hyperlipidemia, unspecified: Secondary | ICD-10-CM

## 2023-11-19 DIAGNOSIS — E039 Hypothyroidism, unspecified: Secondary | ICD-10-CM | POA: Diagnosis not present

## 2023-11-20 LAB — LIPID PANEL W/O CHOL/HDL RATIO
Cholesterol, Total: 206 mg/dL — ABNORMAL HIGH (ref 100–199)
HDL: 51 mg/dL (ref >39)
LDL Chol Calc (NIH): 120 mg/dL — ABNORMAL HIGH (ref 0–99)
Triglycerides: 200 mg/dL — ABNORMAL HIGH (ref 0–149)
VLDL Cholesterol Cal: 35 mg/dL (ref 5–40)

## 2023-11-20 LAB — TSH: TSH: 1.85 u[IU]/mL (ref 0.450–4.500)

## 2023-11-23 ENCOUNTER — Other Ambulatory Visit: Payer: Self-pay | Admitting: Family Medicine

## 2023-11-23 MED ORDER — LEVOTHYROXINE SODIUM 50 MCG PO TABS: 50.0000 ug | ORAL_TABLET | Freq: Every day | ORAL | 3 refills | Status: DC

## 2023-12-19 ENCOUNTER — Other Ambulatory Visit: Payer: Self-pay | Admitting: Family Medicine

## 2023-12-20 NOTE — Telephone Encounter (Signed)
Requested Prescriptions  Pending Prescriptions Disp Refills   albuterol (VENTOLIN HFA) 108 (90 Base) MCG/ACT inhaler [Pharmacy Med Name: ALBUTEROL HFA INH (200 PUFFS) 8.5GM] 8.5 g 0    Sig: INHALE 2 PUFFS INTO THE LUNGS EVERY 6 HOURS AS NEEDED FOR WHEEZING OR SHORTNESS OF BREATH     Pulmonology:  Beta Agonists 2 Passed - 12/20/2023  3:28 PM      Passed - Last BP in normal range    BP Readings from Last 1 Encounters:  10/05/23 128/84         Passed - Last Heart Rate in normal range    Pulse Readings from Last 1 Encounters:  10/05/23 79         Passed - Valid encounter within last 12 months    Recent Outpatient Visits           2 months ago Routine general medical examination at a health care facility   [facility] Megan P, DO   8 months ago Panic disorder without agoraphobia   [facility] [facility] [place], [place] P, DO   1 year ago Routine general medical examination at a health care facility   [facility] [place], [place] P, DO   1 year ago Upper respiratory tract infection, unspecified type   [facility] [place], Megan P, DO   1 year ago Panic disorder without agoraphobia   [place] [facility] Dorcas Carrow, DO       Future Appointments             In 2 months Willeen Niece, MD [facility]   In 3 months Laural Benes, Oralia Rud, DO  [facility]

## 2024-01-07 ENCOUNTER — Other Ambulatory Visit: Payer: Self-pay | Admitting: Dermatology

## 2024-01-07 DIAGNOSIS — L304 Erythema intertrigo: Secondary | ICD-10-CM

## 2024-01-25 ENCOUNTER — Telehealth: Payer: Self-pay

## 2024-01-25 NOTE — Telephone Encounter (Signed)
 Called and scheduled patient's shingles vaccine.   Copied from CRM [id number]. Topic: Appointments - Scheduling Inquiry for Clinic >> Jan 25, 2024 10:03 AM Franchot Heidelberg wrote: Reason for CRM: Pt wants her 2nd shingles vaccine, please advise   Best contact: [phone]

## 2024-02-01 ENCOUNTER — Ambulatory Visit (INDEPENDENT_AMBULATORY_CARE_PROVIDER_SITE_OTHER)

## 2024-02-01 DIAGNOSIS — Z23 Encounter for immunization: Secondary | ICD-10-CM | POA: Diagnosis not present

## 2024-02-19 ENCOUNTER — Other Ambulatory Visit: Payer: Self-pay | Admitting: Family Medicine

## 2024-02-21 NOTE — Telephone Encounter (Signed)
 Requested Prescriptions  Pending Prescriptions Disp Refills   albuterol (VENTOLIN HFA) 108 (90 Base) MCG/ACT inhaler [Pharmacy Med Name: ALBUTEROL HFA INH (200 PUFFS) 8.5GM] 8.5 g 0    Sig: INHALE 2 PUFFS INTO THE LUNGS EVERY 6 HOURS AS NEEDED FOR WHEEZING OR SHORTNESS OF BREATH     Pulmonology:  Beta Agonists 2 Failed - 02/21/2024 12:18 PM      Failed - Valid encounter within last 12 months    Recent Outpatient Visits   None     Future Appointments             In 2 weeks Willeen Niece, MD [facility]   In 1 month [place], [place], DO [facility]            Passed - Last BP in normal range    BP Readings from Last 1 Encounters:  10/05/23 128/84         Passed - Last Heart Rate in normal range    Pulse Readings from Last 1 Encounters:  10/05/23 79

## 2024-03-11 ENCOUNTER — Encounter: Payer: Self-pay | Admitting: Dermatology

## 2024-03-11 ENCOUNTER — Ambulatory Visit: Payer: BC Managed Care – PPO | Admitting: Dermatology

## 2024-03-11 DIAGNOSIS — M67442 Ganglion, left hand: Secondary | ICD-10-CM

## 2024-03-11 DIAGNOSIS — M6740 Ganglion, unspecified site: Secondary | ICD-10-CM

## 2024-03-11 DIAGNOSIS — Z79899 Other long term (current) drug therapy: Secondary | ICD-10-CM | POA: Diagnosis not present

## 2024-03-11 DIAGNOSIS — L304 Erythema intertrigo: Secondary | ICD-10-CM | POA: Diagnosis not present

## 2024-03-11 DIAGNOSIS — L719 Rosacea, unspecified: Secondary | ICD-10-CM

## 2024-03-11 MED ORDER — KETOCONAZOLE 2 % EX CREA: TOPICAL_CREAM | CUTANEOUS | 5 refills | Status: AC

## 2024-03-11 MED ORDER — IVERMECTIN 1 % EX CREA: TOPICAL_CREAM | CUTANEOUS | 11 refills | Status: DC

## 2024-03-11 NOTE — Progress Notes (Signed)
 Follow-Up Visit   Subjective  Sherry Gonzalez is a 53 y.o. female who presents for the following: Rosacea. Using Soolantra  cream as directed. Does use Sulfacetamide sodium-sulfur cleanser after laser treatments. Has quarterly BBL treatments in [facility]. New tender lesion came up on hand couple days go.    The following portions of the chart were reviewed this encounter and updated as appropriate: medications, allergies, medical history  Review of Systems:  No other skin or systemic complaints except as noted in HPI or Assessment and Plan.  Objective  Well appearing patient in no apparent distress; mood and affect are within normal limits.  Areas Examined: face  Relevant physical exam findings are noted in the Assessment and Plan.  Left Palmar Proximal 4th Finger Subcutaneous nodule  Assessment & Plan   GANGLION CYST Left Palmar Proximal 4th Finger Benign, observe.    If doesn't self-resolve, recommend seeing hand surgeon for evaluation and treatment options.  Call for referral if not resolving. Plan to refer to Dr. Aloha Arnold in [place] at [facility] ROSACEA   Related Medications Ivermectin  (SOOLANTRA ) 1 % CREA Apply to face at bedtime. ERYTHEMA INTERTRIGO   Related Medications ketoconazole  (NIZORAL ) 2 % cream Apply at bedtime to skin folds as needed.  ROSACEA  Exam:  Mild erythema at cheeks, nose, chin, forehead with few telangiectasias   Chronic and persistent condition with duration or expected duration over one year. Condition is improving with treatment but not currently at goal.   Rosacea is a chronic progressive skin condition usually affecting the face of adults, causing redness and/or acne bumps. It is treatable but not curable. It sometimes affects the eyes (ocular rosacea) as well. It may respond to topical and/or systemic medication and can flare with stress, sun exposure, alcohol, exercise, topical steroids (including hydrocortisone /cortisone 10) and  some foods.  Daily application of broad spectrum spf 30+ sunscreen to face is recommended to reduce flares.  Treatment Plan Continue Soolantra  Cream Apply to face QHS.  Continue BBL treatments (patient treated in [facility] ) Continue daily sunscreen Continue Sulfacetamide sodium-sulfur cleanser as directed by derm in [facility].   Long term medication management.  Patient is using long term (months to years) prescription medication  to control their dermatologic condition.  These medications require periodic monitoring to evaluate for efficacy and side effects.   INTERTRIGO Exam: Mild erythema in body folds  Chronic and persistent condition with duration or expected duration over one year. Condition is symptomatic/ bothersome to patient. Not currently at goal.   Intertrigo is a chronic recurrent rash that occurs in skin fold areas that may be associated with friction; heat; moisture; yeast; fungus; and bacteria.  It is exacerbated by increased movement / activity; sweating; and higher atmospheric temperature.  Use of an absorbant powder such as Zeasorb AF powder or other OTC antifungal powder to the area daily can prevent rash recurrence. Other options to help keep the area dry include blow drying the area after bathing or using antiperspirant products such as Duradry sweat minimizing gel.  Treatment Plan: Continue as needed  Ketoconazole  2% Cream Apply to skin folds QHS dsp 60g 5Rf. Continue as needed Zeasorb AF Powder Apply to skin folds every morning and prior to exercise.     Return in about 1 year (around 03/11/2025) for Rosacea Follow Up.  I, Jill Parcell, CMA, am acting as scribe for Artemio Larry, MD.   Documentation: I have reviewed the above documentation for accuracy and completeness, and I agree with  the above.  Artemio Larry, MD

## 2024-03-11 NOTE — Patient Instructions (Addendum)
 Continue Soolantra  Cream Apply to face QHS.  Continue BBL treatments (patient treated in [facility] ) Continue daily sunscreen Continue Sulfacetamide sodium-sulfur cleanser as directed by derm in [facility].   Continue as needed  Ketoconazole  2% Cream Apply to skin folds nightly at bedtime Continue as needed Zeasorb AF Powder Apply to skin folds every morning and prior to exercise.    Intertrigo is a chronic recurrent rash that occurs in skin fold areas that may be associated with friction; heat; moisture; yeast; fungus; and bacteria.  It is exacerbated by increased movement / activity; sweating; and higher atmospheric temperature.  Use of an absorbant powder such as Zeasorb AF powder or other OTC antifungal powder to the area daily can prevent rash recurrence. Other options to help keep the area dry include blow drying the area after bathing or using antiperspirant products such as Duradry sweat minimizing gel.   Recommend seeing hand surgeon for evaluation and treatment options.  Call for referral if not resolving. Plan to refer to Dr. Aloha Arnold in [place] at [facility]   Recommend daily broad spectrum sunscreen SPF 30+ to sun-exposed areas, reapply every 2 hours as needed. Call for new or changing lesions.  Staying in the shade or wearing long sleeves, sun glasses (UVA+UVB protection) and wide brim hats (4-inch brim around the entire circumference of the hat) are also recommended for sun protection.     Due to recent changes in healthcare laws, you may see results of your pathology and/or laboratory studies on MyChart before the doctors have had a chance to review them. We understand that in some cases there may be results that are confusing or concerning to you. Please understand that not all results are received at the same time and often the doctors may need to interpret multiple results in order to provide you with the best plan of care or course of treatment. Therefore, we ask that  you please give us  2 business days to thoroughly review all your results before contacting the office for clarification. Should we see a critical lab result, you will be contacted sooner.   If You Need Anything After Your Visit  If you have any questions or concerns for your doctor, please call our main line at [phone] and press option 4 to reach your doctor's medical assistant. If no one answers, please leave a voicemail as directed and we will return your call as soon as possible. Messages left after 4 pm will be answered the following business day.   You may also send us  a message via MyChart. We typically respond to MyChart messages within 1-2 business days.  For prescription refills, please ask your pharmacy to contact our office. Our fax number is [phone].  If you have an urgent issue when the clinic is closed that cannot wait until the next business day, you can page your doctor at the number below.    Please note that while we do our best to be available for urgent issues outside of office hours, we are not available 24/7.   If you have an urgent issue and are unable to reach us , you may choose to seek medical care at your doctor's office, retail clinic, urgent care center, or emergency room.  If you have a medical emergency, please immediately call 911 or go to the emergency department.  Pager Numbers  - Dr. Bary Likes: [phone]  - Dr. Annette Barters: [phone]  - Dr. Felipe Horton: [phone]   In the event of inclement weather, please  call our main line at [phone] for an update on the status of any delays or closures.  Dermatology Medication Tips: Please keep the boxes that topical medications come in in order to help keep track of the instructions about where and how to use these. Pharmacies typically print the medication instructions only on the boxes and not directly on the medication tubes.   If your medication is too expensive, please contact our office at  [phone] option 4 or send us  a message through MyChart.   We are unable to tell what your co-pay for medications will be in advance as this is different depending on your insurance coverage. However, we may be able to find a substitute medication at lower cost or fill out paperwork to get insurance to cover a needed medication.   If a prior authorization is required to get your medication covered by your insurance company, please allow us  1-2 business days to complete this process.  Drug prices often vary depending on where the prescription is filled and some pharmacies may offer cheaper prices.  The website www.goodrx.com contains coupons for medications through different pharmacies. The prices here do not account for what the cost may be with help from insurance (it may be cheaper with your insurance), but the website can give you the price if you did not use any insurance.  - You can print the associated coupon and take it with your prescription to the pharmacy.  - You may also stop by our office during regular business hours and pick up a GoodRx coupon card.  - If you need your prescription sent electronically to a different pharmacy, notify our office through [facility] or by phone at [phone] option 4.     Si Usted Necesita Algo Despus de Su Visita  Tambin puede enviarnos un mensaje a travs de Clinical cytogeneticist. Por lo general respondemos a los mensajes de MyChart en el transcurso de 1 a 2 das hbiles.  Para renovar recetas, por favor pida a su farmacia que se ponga en contacto con nuestra oficina. Franz Jacks de fax es [place] [phone].  Si tiene un asunto urgente cuando la clnica est cerrada y que no puede esperar hasta el siguiente da hbil, puede llamar/localizar a su doctor(a) al nmero que aparece a continuacin.   Por favor, tenga en cuenta que aunque hacemos todo lo posible para estar disponibles para asuntos urgentes fuera del horario de [place], no estamos  disponibles las 24 horas del da, los 7 [address]  [address] de la [place].   Si tiene un problema urgente y no puede comunicarse con nosotros, puede optar por buscar atencin mdica  en el consultorio de su doctor(a), en una clnica privada, en un centro de atencin urgente o en una sala de emergencias.  Si tiene Engineer, drilling, por favor llame inmediatamente al 911 o vaya a la sala de emergencias.  Nmeros de bper  - Dr. Bary Likes: [phone]  - Dra. Annette Barters: [phone]  - Dr. Felipe Horton: [phone]   En caso de inclemencias del tiempo, por favor llame a [place] principal al [phone] para una actualizacin sobre el [place] de cualquier retraso o cierre.  Consejos para la medicacin en dermatologa: Por favor, guarde las cajas en las que vienen los medicamentos de uso tpico para ayudarle a seguir las instrucciones sobre dnde y cmo usarlos. Las farmacias generalmente imprimen las instrucciones del medicamento slo en las cajas y no directamente en los tubos del [place].   Si su medicamento es [employer],  por favor, pngase en contacto con nuestra oficina llamando al [phone] y presione la opcin 4 o envenos un mensaje a travs de Clinical cytogeneticist.   No podemos decirle cul ser su copago por los medicamentos por adelantado ya que esto es diferente dependiendo de la cobertura de su seguro. Sin embargo, es posible que podamos encontrar un medicamento sustituto a Audiological scientist un formulario para que el seguro cubra el medicamento que se considera necesario.   Si se requiere una autorizacin previa para que su compaa de seguros [place] su medicamento, por favor permtanos de 1 a 2 das hbiles para completar este proceso.  Los precios de los medicamentos varan con frecuencia dependiendo del Environmental consultant de dnde se surte la receta y alguna farmacias pueden ofrecer precios ms baratos.  El sitio web www.goodrx.com tiene cupones para medicamentos de Health and safety inspector. Los precios aqu no  tienen en cuenta lo que podra costar con la ayuda del seguro (puede ser ms barato con su seguro), pero el sitio web puede darle el precio si no utiliz Tourist information centre manager.  - Puede imprimir el cupn correspondiente y llevarlo con su receta a la farmacia.  - Tambin puede pasar por nuestra oficina durante el horario de atencin regular y Education officer, museum una tarjeta de cupones de GoodRx.  - Si necesita que su receta se enve electrnicamente a una farmacia diferente, informe a nuestra oficina a travs de MyChart de [place] o por telfono llamando al [phone] y presione la opcin 4.

## 2024-03-27 ENCOUNTER — Ambulatory Visit: Admitting: Nurse Practitioner

## 2024-03-27 VITALS — BP 135/84 | HR 101 | Temp 97.9°F | Ht 59.0 in | Wt 195.0 lb

## 2024-03-27 DIAGNOSIS — F410 Panic disorder [episodic paroxysmal anxiety] without agoraphobia: Secondary | ICD-10-CM

## 2024-03-27 NOTE — Assessment & Plan Note (Signed)
 Suspect symptoms are related to anxiety.  No red flags on exam.  Recommend using Xanax  as needed for anxiety.  Discussed with patient the option to increase Mirtazipine or add another medication to help control anxiety.  She would like to hold off at this time.  Has an appointment with PCP next week.  If symptoms are not controlled at that time can discuss changing medication regimen.

## 2024-03-27 NOTE — Progress Notes (Signed)
 BP 135/84 (BP Location: Right Arm, Patient Position: Sitting, Cuff Size: Large)   Pulse (!) 101   Temp 97.9 F (36.6 C) (Oral)   Ht 4\' 11"  (1.499 m)   Wt 195 lb (88.5 kg)   SpO2 98%   BMI 39.39 kg/m    Subjective:    Patient ID: Sherry Gonzalez, female    DOB: 09-18-1971, 53 y.o.   MRN: [id number]  HPI: Sherry Gonzalez is a 53 y.o. female  Chief Complaint  Patient presents with   Head Injury    Pt hit had on a glass door on Monday. Night sweats that night. Pain went away and came back Tuesday afternoon. Pain is at the top of her head and the back of head. Pt has also been running a low grade temp since the incident. Denies LOC. No nausea, vomiting, or dizziness. Loss of appetite since incident.  Pain 3/10 right now.      Anxiety   Patient states she his her head on a glass door on Monday afternoon.  Not sure how hard it was.  About an hour later she noticed that the front of her head was burning.  She took tylenol- taking it every 4 hours since that times.  Denies blurring vision, loss of LOC.  One night sweat on Monday night.  On Tuesday she woke up and felt fine.  At some point during the day on Tuesday she started having pain on the top of her head.  She was able to work that day but then Tuesday night she woke up and had a temp (didn't check her temp).  Wasn't sure if she was having a panic attack. She took tylenol and went back to sleep.  She has been worried about this situation since that time.  She has been able to work.  She did take a drive to the bank and felt like it was because of the panic attack.  She mostly feels fine but right now she is having pain on the front of her head. She is on Mirtazipine daily which has helped with her symptoms.  She uses the Xanax  rarely.    Relevant past medical, surgical, family and social history reviewed and updated as indicated. Interim medical history since our last visit reviewed. Allergies and medications reviewed and updated.  Review of  Systems  Constitutional:  Positive for fever.  Eyes:  Negative for visual disturbance.  Endocrine:       Night sweat  Neurological:  Positive for headaches.  Psychiatric/Behavioral:  The patient is nervous/anxious.     Per HPI unless specifically indicated above     Objective:     BP 135/84 (BP Location: Right Arm, Patient Position: Sitting, Cuff Size: Large)   Pulse (!) 101   Temp 97.9 F (36.6 C) (Oral)   Ht 4\' 11"  (1.499 m)   Wt 195 lb (88.5 kg)   SpO2 98%   BMI 39.39 kg/m   Wt Readings from Last 3 Encounters:  03/27/24 195 lb (88.5 kg)  10/05/23 192 lb 6.4 oz (87.3 kg)  04/05/23 192 lb 14.4 oz (87.5 kg)    Physical Exam Vitals and nursing note reviewed.  Constitutional:      General: She is not in acute distress.    Appearance: Normal appearance. She is normal weight. She is not ill-appearing, toxic-appearing or diaphoretic.  HENT:     Head: Normocephalic.     Right Ear: External ear normal.     Left Ear:  External ear normal.     Nose: Nose normal.     Mouth/Throat:     Mouth: Mucous membranes are moist.     Pharynx: Oropharynx is clear.  Eyes:     General:        Right eye: No discharge.        Left eye: No discharge.     Extraocular Movements: Extraocular movements intact.     Conjunctiva/sclera: Conjunctivae normal.     Pupils: Pupils are equal, round, and reactive to light.  Cardiovascular:     Rate and Rhythm: Normal rate and regular rhythm.     Heart sounds: No murmur heard. Pulmonary:     Effort: Pulmonary effort is normal. No respiratory distress.     Breath sounds: Normal breath sounds. No wheezing or rales.  Musculoskeletal:     Cervical back: Normal range of motion and neck supple.  Skin:    General: Skin is warm and dry.     Capillary Refill: Capillary refill takes less than 2 seconds.  Neurological:     General: No focal deficit present.     Mental Status: She is alert and oriented to person, place, and time. Mental status is at baseline.      Cranial Nerves: Cranial nerves 2-12 are intact.     Sensory: Sensation is intact.     Motor: Motor function is intact.     Coordination: Coordination is intact.     Gait: Gait is intact.  Psychiatric:        Mood and Affect: Mood normal.        Behavior: Behavior normal.        Thought Content: Thought content normal.        Judgment: Judgment normal.     Results for orders placed or performed in visit on 11/19/23  Lipid Panel w/o Chol/HDL Ratio   Collection Time: 11/19/23 11:08 AM  Result Value Ref Range   Cholesterol, Total 206 (H) 100 - 199 mg/dL   Triglycerides 161 (H) 0 - 149 mg/dL   HDL 51 >09 mg/dL   VLDL Cholesterol Cal 35 5 - 40 mg/dL   LDL Chol Calc (NIH) 604 (H) 0 - 99 mg/dL  TSH   Collection Time: 11/19/23 11:08 AM  Result Value Ref Range   TSH 1.850 0.450 - 4.500 uIU/mL      Assessment & Plan:   Problem List Items Addressed This Visit       Other   Anxiety disorder - Primary   Suspect symptoms are related to anxiety.  No red flags on exam.  Recommend using Xanax  as needed for anxiety.  Discussed with patient the option to increase Mirtazipine or add another medication to help control anxiety.  She would like to hold off at this time.  Has an appointment with PCP next week.  If symptoms are not controlled at that time can discuss changing medication regimen.         Follow up plan: No follow-ups on file.

## 2024-04-03 ENCOUNTER — Encounter: Payer: Self-pay | Admitting: Family Medicine

## 2024-04-03 ENCOUNTER — Ambulatory Visit: Payer: Self-pay | Admitting: Family Medicine

## 2024-04-03 VITALS — BP 143/82 | HR 83 | Ht 59.0 in | Wt 199.2 lb

## 2024-04-03 DIAGNOSIS — E039 Hypothyroidism, unspecified: Secondary | ICD-10-CM | POA: Diagnosis not present

## 2024-04-03 DIAGNOSIS — E559 Vitamin D deficiency, unspecified: Secondary | ICD-10-CM | POA: Diagnosis not present

## 2024-04-03 DIAGNOSIS — F410 Panic disorder [episodic paroxysmal anxiety] without agoraphobia: Secondary | ICD-10-CM

## 2024-04-03 MED ORDER — ALPRAZOLAM 0.25 MG PO TABS: ORAL_TABLET | ORAL | 0 refills | Status: DC

## 2024-04-03 MED ORDER — BUPROPION HCL ER (SR) 150 MG PO TB12: ORAL_TABLET | ORAL | 2 refills | Status: DC

## 2024-04-03 MED ORDER — MIRTAZAPINE 15 MG PO TABS: 15.0000 mg | ORAL_TABLET | Freq: Every day | ORAL | 1 refills | Status: DC

## 2024-04-03 MED ORDER — ALBUTEROL SULFATE HFA 108 (90 BASE) MCG/ACT IN AERS: 2.0000 | INHALATION_SPRAY | Freq: Four times a day (QID) | RESPIRATORY_TRACT | 3 refills | Status: DC | PRN

## 2024-04-03 NOTE — Assessment & Plan Note (Signed)
 Rechecking labs today. Await results. Treat as needed.

## 2024-04-03 NOTE — Progress Notes (Signed)
 BP (!) 143/82 (BP Location: Left Arm, Patient Position: Sitting, Cuff Size: Large)   Pulse 83   Ht 4\' 11"  (1.499 m)   Wt 199 lb 3.2 oz (90.4 kg)   SpO2 97%   BMI 40.23 kg/m    Subjective:    Patient ID: Sherry Gonzalez, female    DOB: Jun 29, 1971, 53 y.o.   MRN: [id number]  HPI: Sherry Gonzalez is a 53 y.o. female  Chief Complaint  Patient presents with   Anxiety    Pt would like to discuss a possible additional anxiety med to have something to take during the day.    Obesity    Would like to discuss getting on a weight loss med.    ANXIETY/STRESS Duration: chronic Status:stable Previous Medications: mirtazpine, sertraline , effexor  Anxious mood: yes  Excessive worrying: yes Irritability: no  Sweating: no Nausea: no Palpitations:no Hyperventilation: no Panic attacks: no Agoraphobia: no  Obscessions/compulsions: no Depressed mood: yes    04/03/2024    1:24 PM 03/27/2024   11:32 AM 10/05/2023    8:21 AM 04/05/2023    9:06 AM 09/29/2022    8:45 AM  Depression screen PHQ 2/9  Decreased Interest 0 0 0 0 0  Down, Depressed, Hopeless 0 0 0 0 0  PHQ - 2 Score 0 0 0 0 0  Altered sleeping 0 0 0 0 0  Tired, decreased energy 0 0 0 1 0  Change in appetite 0 0 0 0 0  Feeling bad or failure about yourself  0 0 0 0 0  Trouble concentrating 0 0 0 0 0  Moving slowly or fidgety/restless 0 0 0 0 0  Suicidal thoughts 0 0 0 0 0  PHQ-9 Score 0 0 0 1 0  Difficult doing work/chores   Not difficult at all Not difficult at all Not difficult at all   Anhedonia: no Weight changes: no Insomnia: no   Hypersomnia: no Fatigue/loss of energy: yes Feelings of worthlessness: no Feelings of guilt: no Impaired concentration/indecisiveness: no Suicidal ideations: no  Crying spells: no Recent Stressors/Life Changes: no   Relationship problems: no   Family stress: no     Financial stress: no    Job stress: no    Recent death/loss: no  HYPOTHYROIDISM Thyroid  control status:controlled Satisfied  with current treatment? no Medication side effects: no Medication compliance: excellent compliance Recent dose adjustment:no Fatigue: yes Cold intolerance: no Heat intolerance: no Weight gain: yes Weight loss: no Constipation: no Diarrhea/loose stools: no Palpitations: no Lower extremity edema: no Anxiety/depressed mood: yes  OBESITY Duration: chronic Previous attempts at weight loss: yes- diet and exercise Complications of obesity: Anxiety Peak weight: current (195#) Weight loss goal: to be healthy Weight loss to date: none Requesting obesity pharmacotherapy: yes Current weight loss supplements/medications: no Previous weight loss supplements/meds: no  Relevant past medical, surgical, family and social history reviewed and updated as indicated. Interim medical history since our last visit reviewed. Allergies and medications reviewed and updated.  Review of Systems  Constitutional: Negative.   Respiratory: Negative.    Cardiovascular: Negative.   Musculoskeletal: Negative.   Skin: Negative.   Psychiatric/Behavioral: Negative.      Per HPI unless specifically indicated above     Objective:     BP (!) 143/82 (BP Location: Left Arm, Patient Position: Sitting, Cuff Size: Large)   Pulse 83   Ht 4\' 11"  (1.499 m)   Wt 199 lb 3.2 oz (90.4 kg)   SpO2 97%   BMI 40.23 kg/m  Wt Readings from Last 3 Encounters:  04/03/24 199 lb 3.2 oz (90.4 kg)  03/27/24 195 lb (88.5 kg)  10/05/23 192 lb 6.4 oz (87.3 kg)    Physical Exam Vitals and nursing note reviewed.  Constitutional:      General: She is not in acute distress.    Appearance: Normal appearance. She is obese. She is not ill-appearing, toxic-appearing or diaphoretic.  HENT:     Head: Normocephalic and atraumatic.     Right Ear: External ear normal.     Left Ear: External ear normal.     Nose: Nose normal.     Mouth/Throat:     Mouth: Mucous membranes are moist.     Pharynx: Oropharynx is clear.  Eyes:      General: No scleral icterus.       Right eye: No discharge.        Left eye: No discharge.     Extraocular Movements: Extraocular movements intact.     Conjunctiva/sclera: Conjunctivae normal.     Pupils: Pupils are equal, round, and reactive to light.  Cardiovascular:     Rate and Rhythm: Normal rate and regular rhythm.     Pulses: Normal pulses.     Heart sounds: Normal heart sounds. No murmur heard.    No friction rub. No gallop.  Pulmonary:     Effort: Pulmonary effort is normal. No respiratory distress.     Breath sounds: Normal breath sounds. No stridor. No wheezing, rhonchi or rales.  Chest:     Chest wall: No tenderness.  Musculoskeletal:        General: Normal range of motion.     Cervical back: Normal range of motion and neck supple.  Skin:    General: Skin is warm and dry.     Capillary Refill: Capillary refill takes less than 2 seconds.     Coloration: Skin is not jaundiced or pale.     Findings: No bruising, erythema, lesion or rash.  Neurological:     General: No focal deficit present.     Mental Status: She is alert and oriented to person, place, and time. Mental status is at baseline.  Psychiatric:        Mood and Affect: Mood normal.        Behavior: Behavior normal.        Thought Content: Thought content normal.        Judgment: Judgment normal.     Results for orders placed or performed in visit on 11/19/23  Lipid Panel w/o Chol/HDL Ratio   Collection Time: 11/19/23 11:08 AM  Result Value Ref Range   Cholesterol, Total 206 (H) 100 - 199 mg/dL   Triglycerides 829 (H) 0 - 149 mg/dL   HDL 51 >56 mg/dL   VLDL Cholesterol Cal 35 5 - 40 mg/dL   LDL Chol Calc (NIH) 213 (H) 0 - 99 mg/dL  TSH   Collection Time: 11/19/23 11:08 AM  Result Value Ref Range   TSH 1.850 0.450 - 4.500 uIU/mL      Assessment & Plan:   Problem List Items Addressed This Visit       Endocrine   Hypothyroid   Rechecking labs today. Await results. Treat as needed.        Relevant Orders   TSH   CBC with Differential/Platelet   Basic metabolic panel with GFR     Other   Anxiety disorder - Primary   Relevant Medications   buPROPion (WELLBUTRIN SR) 150  MG 12 hr tablet   ALPRAZolam  (XANAX ) 0.25 MG tablet   mirtazapine  (REMERON ) 15 MG tablet   Other Relevant Orders   CBC with Differential/Platelet   Basic metabolic panel with GFR   Vitamin D  deficiency   Rechecking labs today. Await results. Treat as needed.       Relevant Orders   VITAMIN D  25 Hydroxy (Vit-D Deficiency, Fractures)   CBC with Differential/Platelet   Basic metabolic panel with GFR     Follow up plan: Return in about 6 weeks (around 05/15/2024).

## 2024-04-03 NOTE — Assessment & Plan Note (Signed)
 Not doing great. Will start her on wellbutrin and recheck in 4-6 weeks. Call with any concerns.

## 2024-04-04 ENCOUNTER — Ambulatory Visit: Payer: Self-pay | Admitting: Family Medicine

## 2024-04-04 LAB — BASIC METABOLIC PANEL WITH GFR
BUN/Creatinine Ratio: 14 (ref 9–23)
BUN: 12 mg/dL (ref 6–24)
CO2: 25 mmol/L (ref 20–29)
Calcium: 9.7 mg/dL (ref 8.7–10.2)
Chloride: 101 mmol/L (ref 96–106)
Creatinine, Ser: 0.88 mg/dL (ref 0.57–1.00)
Glucose: 86 mg/dL (ref 70–99)
Potassium: 4.2 mmol/L (ref 3.5–5.2)
Sodium: 140 mmol/L (ref 134–144)
eGFR: 80 mL/min/1.73

## 2024-04-04 LAB — CBC WITH DIFFERENTIAL/PLATELET
Basophils Absolute: 0 10*3/uL (ref 0.0–0.2)
Basos: 0 %
EOS (ABSOLUTE): 0.3 10*3/uL (ref 0.0–0.4)
Eos: 3 %
Hematocrit: 40.5 % (ref 34.0–46.6)
Hemoglobin: 13.3 g/dL (ref 11.1–15.9)
Immature Grans (Abs): 0 10*3/uL (ref 0.0–0.1)
Immature Granulocytes: 1 %
Lymphocytes Absolute: 1.6 10*3/uL (ref 0.7–3.1)
Lymphs: 18 %
MCH: 30.3 pg (ref 26.6–33.0)
MCHC: 32.8 g/dL (ref 31.5–35.7)
MCV: 92 fL (ref 79–97)
Monocytes Absolute: 0.7 10*3/uL (ref 0.1–0.9)
Monocytes: 8 %
Neutrophils Absolute: 6.2 10*3/uL (ref 1.4–7.0)
Neutrophils: 70 %
Platelets: 283 10*3/uL (ref 150–450)
RBC: 4.39 x10E6/uL (ref 3.77–5.28)
RDW: 12.5 % (ref 11.7–15.4)
WBC: 8.8 10*3/uL (ref 3.4–10.8)

## 2024-04-04 LAB — TSH: TSH: 2.75 u[IU]/mL (ref 0.450–4.500)

## 2024-04-04 LAB — VITAMIN D 25 HYDROXY (VIT D DEFICIENCY, FRACTURES): Vit D, 25-Hydroxy: 22.5 ng/mL — ABNORMAL LOW (ref 30.0–100.0)

## 2024-04-04 MED ORDER — VITAMIN D (ERGOCALCIFEROL) 1.25 MG (50000 UNIT) PO CAPS: 50000.0000 [IU] | ORAL_CAPSULE | ORAL | 1 refills | Status: DC

## 2024-04-09 ENCOUNTER — Ambulatory Visit
Admission: EM | Admit: 2024-04-09 | Discharge: 2024-04-09 | Disposition: A | Discharge status: Home / Self Care | Attending: Emergency Medicine | Admitting: Emergency Medicine

## 2024-04-09 ENCOUNTER — Encounter: Payer: Self-pay | Admitting: Emergency Medicine

## 2024-04-09 DIAGNOSIS — R079 Chest pain, unspecified: Secondary | ICD-10-CM | POA: Diagnosis not present

## 2024-04-09 DIAGNOSIS — M79602 Pain in left arm: Secondary | ICD-10-CM | POA: Diagnosis not present

## 2024-04-09 DIAGNOSIS — J45909 Unspecified asthma, uncomplicated: Secondary | ICD-10-CM | POA: Diagnosis not present

## 2024-04-09 DIAGNOSIS — E039 Hypothyroidism, unspecified: Secondary | ICD-10-CM | POA: Diagnosis not present

## 2024-04-09 DIAGNOSIS — R072 Precordial pain: Secondary | ICD-10-CM | POA: Diagnosis not present

## 2024-04-09 DIAGNOSIS — F410 Panic disorder [episodic paroxysmal anxiety] without agoraphobia: Secondary | ICD-10-CM | POA: Diagnosis not present

## 2024-04-09 NOTE — Discharge Instructions (Addendum)
 Please go to the emergency department at [facility] to be evaluated for your chest pain.

## 2024-04-09 NOTE — ED Triage Notes (Signed)
 Patient states that she had a panic attack at 2 am. Heart was racing and she took .25 of xanex. She has noticed tingling in left arm last night. Started again today and tingling was radiating up arm. Then started having chest discomfort in the middle of her chest.

## 2024-04-09 NOTE — ED Provider Notes (Signed)
 [facility]    CSN: [id number] Arrival date & time: 04/09/24  1459      History   Chief Complaint Chief Complaint  Patient presents with   Chest Pain    HPI Sherry Gonzalez is a 53 y.o. female.   HPI  53 year old female with past medical history significant for allergy induced asthma, anxiety, hypothyroidism, carpal tunnel syndrome, and eczema presents for evaluation of substernal chest pain that started at around 2-2:30 this afternoon which she rates as a 3.5/10.  She reports that the pain does not radiate into her jaw or back but she is complaining of left arm numbness.  She also denies any sweating, nausea, or shortness of breath.  She reports that the discomfort has gotten worse since its onset.  She did have a panic attack this morning at around 2 AM and took 0.25 mg of Xanax .  Then she developed the tingling in her left arm.  It got better and then returned.  Past Medical History:  Diagnosis Date   Allergy-induced asthma    Carpal tunnel syndrome, left    left upper limbt   Carpal tunnel syndrome, right    right upper limb   Eustachian tube disorder    left ear   Hand eczema    Hypothyroidism    Mild intermittent asthma    Trigger ring finger of left hand     Patient Active Problem List   Diagnosis Date Noted   Rosacea 04/11/2019   Ehlers-Danlos disease 07/05/2018   Hypothyroid 05/13/2018   Hand eczema 04/18/2018   Family history of musculoskeletal disease 04/05/2018   Bunion of great toe of right foot 04/05/2018   Vitamin D  deficiency 03/28/2017   Hearing loss in left ear 05/02/2016   Anxiety disorder 04/10/2016   Allergic rhinitis 04/03/2016   Dermatitis 04/03/2016   Tachycardia 04/03/2016    Past Surgical History:  Procedure Laterality Date   COLONOSCOPY WITH PROPOFOL  N/A 10/11/2020   Procedure: COLONOSCOPY WITH PROPOFOL ;  Surgeon: Marnee Sink, MD;  Location: [facility];  Service: Endoscopy;  Laterality: N/A;   WISDOM TOOTH  EXTRACTION      OB History   No obstetric history on file.      Home Medications    Prior to Admission medications   Medication Sig Start Date End Date Taking? Authorizing Provider  acetaminophen (TYLENOL) 325 MG tablet Take 325 mg by mouth as needed.    [provider]  albuterol  (VENTOLIN  HFA) 108 (90 Base) MCG/ACT inhaler Inhale 2 puffs into the lungs every 6 (six) hours as needed for wheezing or shortness of breath. 04/03/24   Johnson, Megan P, DO  ALPRAZolam  (XANAX ) 0.25 MG tablet TAKE 1/2 TABLET BY MOUTH DAILY AS NEEDED FOR ANXIETY AND 1/2 TABLET AT NIGHT AS NEEDED FOR SLEEP 04/03/24  Yes Johnson, Megan P, DO  budesonide -formoterol  (SYMBICORT ) 160-4.5 MCG/ACT inhaler Inhale 2 puffs into the lungs 2 (two) times daily. 10/05/23   Johnson, Megan P, DO  buPROPion  (WELLBUTRIN  SR) 150 MG 12 hr tablet Will start 1 pill daily in the AM for 1 week, then increase to 1 pill BID 04/03/24  Yes Johnson, Megan P, DO  ketoconazole  (NIZORAL ) 2 % cream Apply at bedtime to skin folds as needed. Patient not taking: Reported on 04/03/2024 03/11/24   Artemio Larry, MD  levothyroxine  (SYNTHROID ) 50 MCG tablet Take 1 tablet (50 mcg total) by mouth daily before breakfast. 11/23/23  Yes Johnson, Megan P, DO  mirtazapine  (REMERON ) 15 MG tablet  Take 1 tablet (15 mg total) by mouth at bedtime. 04/03/24  Yes Johnson, Megan P, DO  Vitamin D , Ergocalciferol , (DRISDOL ) 1.25 MG (50000 UNIT) CAPS capsule Take 1 capsule (50,000 Units total) by mouth every 7 (seven) days. 04/04/24  Yes Johnson, Jerilee Montane, DO    Family History Family History  Problem Relation Age of Onset   Alcohol abuse Father    Ehlers-Danlos syndrome Mother        Mothers side of the family   Migraines Sister    Breast cancer Neg Hx     Social History Social History   Tobacco Use   Smoking status: Never   Smokeless tobacco: Never  Vaping Use   Vaping status: Never Used  Substance Use Topics   Alcohol use: No    Alcohol/week: 0.0  standard drinks of alcohol   Drug use: No     Allergies   Patient has no known allergies.   Review of Systems Review of Systems  Constitutional:  Negative for diaphoresis.  Respiratory:  Negative for shortness of breath and wheezing.   Cardiovascular:  Positive for chest pain.  Gastrointestinal:  Negative for nausea.     Physical Exam Triage Vital Signs ED Triage Vitals  Encounter Vitals Group     BP      Systolic BP Percentile      Diastolic BP Percentile      Pulse      Resp      Temp      Temp src      SpO2      Weight      Height      Head Circumference      Peak Flow      Pain Score      Pain Loc      Pain Education      Exclude from Growth Chart    No data found.  Updated Vital Signs BP (!) 154/91 (BP Location: Right Arm)   Pulse (!) 103   Temp 98.6 F (37 C) (Oral)   Resp 16   LMP 11/08/2021   SpO2 98%   Visual Acuity Right Eye Distance:   Left Eye Distance:   Bilateral Distance:    Right Eye Near:   Left Eye Near:    Bilateral Near:     Physical Exam Vitals and nursing note reviewed.  Constitutional:      Appearance: Normal appearance. She is not ill-appearing.  HENT:     Head: Normocephalic and atraumatic.  Cardiovascular:     Rate and Rhythm: Normal rate and regular rhythm.     Pulses: Normal pulses.     Heart sounds: Normal heart sounds. No murmur heard.    No friction rub. No gallop.  Pulmonary:     Effort: Pulmonary effort is normal.     Breath sounds: Normal breath sounds. No wheezing, rhonchi or rales.  Skin:    General: Skin is warm and dry.     Capillary Refill: Capillary refill takes less than 2 seconds.     Findings: No erythema or rash.  Neurological:     General: No focal deficit present.     Mental Status: She is alert and oriented to person, place, and time.      UC Treatments / Results  Labs (all labs ordered are listed, but only abnormal results are displayed) Labs Reviewed - No data to  display  EKG Sinus tachycardia with ventricular rate of 110 bpm PR interval  112 ms QRS duration 80 ms QT/QTc 348/470 ms Minimal voltage criteria for LVH, may be normal variant.   Radiology No results found.  Procedures Procedures (including critical care time)  Medications Ordered in UC Medications - No data to display  Initial Impression / Assessment and Plan / UC Course  I have reviewed the triage vital signs and the nursing notes.  Pertinent labs & imaging results that were available during my care of the patient were reviewed by me and considered in my medical decision making (see chart for details).   Patient is a pleasant 53 year old female presenting for evaluation of substernal chest pain as outlined in the HPI above.  She reports that the pain started at around 2-2 30 this afternoon and is still present.  She reports that it had started to improve but then it returned and now it has worsened.  It is associated with tingling in the left arm.  The left arm tingling began last night, improved, and then returned this afternoon.  No associated diaphoresis, nausea, or radiation to the jaw.  Also no shortness of breath.  EKG shows sinus tachycardia.  It is reading minimal voltage criteria for LVH.  No other tracings available for comparison in epic.  No ST or T wave abnormalities noted.  However, given that patient is complaining of substernal chest pain that is worsening I do feel she should be evaluated in the emergency department as we do not have the ability to monitor the patient or check cardiac enzymes here urgent care.  She is in agreement and she will go to [facility] via POV.   Final Clinical Impressions(s) / UC Diagnoses   Final diagnoses:  Chest pain, unspecified type     Discharge Instructions      Please go to the emergency department at [facility] to be evaluated for your chest pain.   ED Prescriptions   None    PDMP not reviewed this  encounter.   Kent Pear, NP 04/09/24 [phone]

## 2024-04-10 DIAGNOSIS — R079 Chest pain, unspecified: Secondary | ICD-10-CM | POA: Diagnosis not present

## 2024-04-26 ENCOUNTER — Encounter: Payer: Self-pay | Admitting: Family Medicine

## 2024-06-03 ENCOUNTER — Encounter: Payer: Self-pay | Admitting: Family Medicine

## 2024-06-03 ENCOUNTER — Ambulatory Visit: Admitting: Family Medicine

## 2024-06-03 VITALS — BP 136/77 | HR 109 | Temp 98.5°F | Resp 15 | Ht 59.61 in | Wt 193.4 lb

## 2024-06-03 DIAGNOSIS — E6609 Other obesity due to excess calories: Secondary | ICD-10-CM | POA: Diagnosis not present

## 2024-06-03 DIAGNOSIS — F410 Panic disorder [episodic paroxysmal anxiety] without agoraphobia: Secondary | ICD-10-CM | POA: Diagnosis not present

## 2024-06-03 DIAGNOSIS — Z6838 Body mass index (BMI) 38.0-38.9, adult: Secondary | ICD-10-CM

## 2024-06-03 DIAGNOSIS — Z005 Encounter for examination of potential donor of organ and tissue: Secondary | ICD-10-CM

## 2024-06-03 DIAGNOSIS — E66812 Obesity, class 2: Secondary | ICD-10-CM | POA: Diagnosis not present

## 2024-06-03 NOTE — Progress Notes (Signed)
 BP 136/77 (BP Location: Left Arm, Patient Position: Sitting, Cuff Size: Large)   Pulse (!) 109   Temp 98.5 F (36.9 C) (Oral)   Resp 15   Ht 4' 11.61 (1.514 m)   Wt 193 lb 6.4 oz (87.7 kg)   LMP 11/08/2021   SpO2 98%   BMI 38.27 kg/m    Subjective:    Patient ID: Sherry Gonzalez, female    DOB: September 02, 1971, 53 y.o.   MRN: [id number]  HPI: Sherry Gonzalez is a 53 y.o. female  Chief Complaint  Patient presents with   Panic disorder    Feels better. Doesn't do well during severe weather.    Obesity   ANXIETY/STRESS- stopped her wellbutrin  due to increased anxiety Duration: chronic Status:stable Anxious mood: yes  Excessive worrying: yes Irritability: no  Sweating: no Nausea: no Palpitations:no Hyperventilation: no Panic attacks: yes Agoraphobia: no  Obscessions/compulsions: no Depressed mood: no    06/03/2024    8:35 AM 04/03/2024    1:24 PM 03/27/2024   11:32 AM 10/05/2023    8:21 AM 04/05/2023    9:06 AM  Depression screen PHQ 2/9  Decreased Interest 0 0 0 0 0  Down, Depressed, Hopeless 0 0 0 0 0  PHQ - 2 Score 0 0 0 0 0  Altered sleeping 0 0 0 0 0  Tired, decreased energy 0 0 0 0 1  Change in appetite 0 0 0 0 0  Feeling bad or failure about yourself  0 0 0 0 0  Trouble concentrating 0 0 0 0 0  Moving slowly or fidgety/restless 0 0 0 0 0  Suicidal thoughts 0 0 0 0 0  PHQ-9 Score 0 0 0 0 1  Difficult doing work/chores    Not difficult at all Not difficult at all      06/03/2024    8:35 AM 04/03/2024    1:24 PM 03/27/2024   11:32 AM 10/05/2023    8:21 AM  GAD 7 : Generalized Anxiety Score  Nervous, Anxious, on Edge 0 1 1 0  Control/stop worrying 0 1 1 0  Worry too much - different things 0 0 1 0  Trouble relaxing 0 1 0 0  Restless 0 0 0 0  Easily annoyed or irritable 0 0 0 0  Afraid - awful might happen 0 1 1 0  Total GAD 7 Score 0 4 4 0  Anxiety Difficulty  Somewhat difficult Somewhat difficult Not difficult at all   Anhedonia: no Weight changes:  yes Insomnia: no   Hypersomnia: no Fatigue/loss of energy: no Feelings of worthlessness: no Feelings of guilt: no Impaired concentration/indecisiveness: no Suicidal ideations: no  Crying spells: no Recent Stressors/Life Changes: no   Relationship problems: no   Family stress: no     Financial stress: no    Job stress: no    Recent death/loss: no  OBESITY Duration: chronic Previous attempts at weight loss: yes- portion control and exercise 3-5x a week Complications of obesity: depression, hypothyroidism Peak weight: 213lbs Weight loss goal: to be healthy Weight loss to date: 20lbs Requesting obesity pharmacotherapy: unsure Current weight loss supplements/medications: no Previous weight loss supplements/meds: no   Relevant past medical, surgical, family and social history reviewed and updated as indicated. Interim medical history since our last visit reviewed. Allergies and medications reviewed and updated.  Review of Systems  Constitutional: Negative.   Respiratory: Negative.    Cardiovascular: Negative.   Musculoskeletal: Negative.   Psychiatric/Behavioral:  Positive for dysphoric  mood. Negative for agitation, behavioral problems, confusion, decreased concentration, hallucinations, self-injury, sleep disturbance and suicidal ideas. The patient is nervous/anxious. The patient is not hyperactive.     Per HPI unless specifically indicated above     Objective:    BP 136/77 (BP Location: Left Arm, Patient Position: Sitting, Cuff Size: Large)   Pulse (!) 109   Temp 98.5 F (36.9 C) (Oral)   Resp 15   Ht 4' 11.61 (1.514 m)   Wt 193 lb 6.4 oz (87.7 kg)   LMP 11/08/2021   SpO2 98%   BMI 38.27 kg/m   Wt Readings from Last 3 Encounters:  06/03/24 193 lb 6.4 oz (87.7 kg)  04/03/24 199 lb 3.2 oz (90.4 kg)  03/27/24 195 lb (88.5 kg)    Physical Exam Vitals and nursing note reviewed.  Constitutional:      General: She is not in acute distress.    Appearance: Normal  appearance. She is not ill-appearing, toxic-appearing or diaphoretic.  HENT:     Head: Normocephalic and atraumatic.     Right Ear: External ear normal.     Left Ear: External ear normal.     Nose: Nose normal.     Mouth/Throat:     Mouth: Mucous membranes are moist.     Pharynx: Oropharynx is clear.  Eyes:     General: No scleral icterus.       Right eye: No discharge.        Left eye: No discharge.     Extraocular Movements: Extraocular movements intact.     Conjunctiva/sclera: Conjunctivae normal.     Pupils: Pupils are equal, round, and reactive to light.  Cardiovascular:     Rate and Rhythm: Normal rate and regular rhythm.     Pulses: Normal pulses.     Heart sounds: Normal heart sounds. No murmur heard.    No friction rub. No gallop.  Pulmonary:     Effort: Pulmonary effort is normal. No respiratory distress.     Breath sounds: Normal breath sounds. No stridor. No wheezing, rhonchi or rales.  Chest:     Chest wall: No tenderness.  Musculoskeletal:        General: Normal range of motion.     Cervical back: Normal range of motion and neck supple.  Skin:    General: Skin is warm and dry.     Capillary Refill: Capillary refill takes less than 2 seconds.     Coloration: Skin is not jaundiced or pale.     Findings: No bruising, erythema, lesion or rash.  Neurological:     General: No focal deficit present.     Mental Status: She is alert and oriented to person, place, and time. Mental status is at baseline.  Psychiatric:        Mood and Affect: Mood normal.        Behavior: Behavior normal.        Thought Content: Thought content normal.        Judgment: Judgment normal.     Results for orders placed or performed in visit on 04/03/24  VITAMIN D  25 Hydroxy (Vit-D Deficiency, Fractures)   Collection Time: 04/03/24  2:44 PM  Result Value Ref Range   Vit D, 25-Hydroxy 22.5 (L) 30.0 - 100.0 ng/mL  TSH   Collection Time: 04/03/24  2:44 PM  Result Value Ref Range   TSH  2.750 0.450 - 4.500 uIU/mL  CBC with Differential/Platelet   Collection Time: 04/03/24  2:44 PM  Result Value Ref Range   WBC 8.8 3.4 - 10.8 x10E3/uL   RBC 4.39 3.77 - 5.28 x10E6/uL   Hemoglobin 13.3 11.1 - 15.9 g/dL   Hematocrit 59.4 65.9 - 46.6 %   MCV 92 79 - 97 fL   MCH 30.3 26.6 - 33.0 pg   MCHC 32.8 31.5 - 35.7 g/dL   RDW 87.4 88.2 - 84.5 %   Platelets 283 150 - 450 x10E3/uL   Neutrophils 70 Not Estab. %   Lymphs 18 Not Estab. %   Monocytes 8 Not Estab. %   Eos 3 Not Estab. %   Basos 0 Not Estab. %   Neutrophils Absolute 6.2 1.4 - 7.0 x10E3/uL   Lymphocytes Absolute 1.6 0.7 - 3.1 x10E3/uL   Monocytes Absolute 0.7 0.1 - 0.9 x10E3/uL   EOS (ABSOLUTE) 0.3 0.0 - 0.4 x10E3/uL   Basophils Absolute 0.0 0.0 - 0.2 x10E3/uL   Immature Granulocytes 1 Not Estab. %   Immature Grans (Abs) 0.0 0.0 - 0.1 x10E3/uL  Basic metabolic panel with GFR   Collection Time: 04/03/24  2:44 PM  Result Value Ref Range   Glucose 86 70 - 99 mg/dL   BUN 12 6 - 24 mg/dL   Creatinine, Ser 9.11 0.57 - 1.00 mg/dL   eGFR 80 >40 fO/fpw/8.26   BUN/Creatinine Ratio 14 9 - 23   Sodium 140 134 - 144 mmol/L   Potassium 4.2 3.5 - 5.2 mmol/L   Chloride 101 96 - 106 mmol/L   CO2 25 20 - 29 mmol/L   Calcium 9.7 8.7 - 10.2 mg/dL      Assessment & Plan:   Problem List Items Addressed This Visit       Other   Anxiety disorder - Primary   Doing OK. Working on coping mechanisms. Will continue current regimen. Consider talking to Child psychotherapist. Call with any concerns.       Class 2 obesity due to excess calories without serious comorbidity with body mass index (BMI) of 38.0 to 38.9 in adult   Will consider starting a GLP-1 if covered by insurance. Will call and let us  know.       Other Visit Diagnoses       Examination of potential donor of organ and tissue       Labs drawn today. Await results.   Relevant Orders   ABO/Rh        Follow up plan: Return in about 4 months (around 10/06/2024) for  physical.

## 2024-06-03 NOTE — Assessment & Plan Note (Signed)
 Doing OK. Working on coping mechanisms. Will continue current regimen. Consider talking to Child psychotherapist. Call with any concerns.

## 2024-06-03 NOTE — Assessment & Plan Note (Signed)
 Will consider starting a GLP-1 if covered by insurance. Will call and let us  know.

## 2024-06-04 ENCOUNTER — Encounter: Payer: Self-pay | Admitting: Family Medicine

## 2024-06-04 ENCOUNTER — Other Ambulatory Visit: Payer: Self-pay | Admitting: Family Medicine

## 2024-06-04 LAB — ABO/RH: Rh Factor: POSITIVE

## 2024-06-04 MED ORDER — ZEPBOUND 5 MG/0.5ML ~~LOC~~ SOAJ: 5.0000 mg | SUBCUTANEOUS | 1 refills | Status: DC

## 2024-06-04 MED ORDER — TIRZEPATIDE-WEIGHT MANAGEMENT 2.5 MG/0.5ML ~~LOC~~ SOLN: 2.5000 mg | SUBCUTANEOUS | 0 refills | Status: DC

## 2024-06-09 ENCOUNTER — Other Ambulatory Visit (HOSPITAL_COMMUNITY): Payer: Self-pay

## 2024-06-09 ENCOUNTER — Telehealth: Payer: Self-pay

## 2024-06-09 NOTE — Telephone Encounter (Signed)
 Pharmacy Patient Advocate Encounter   Received notification from Onbase that prior authorization for Zepbound  2.5MG /0.5ML pen-injectors  is required/requested.   Insurance verification completed.   The patient is insured through [employer] .   Per test claim: PA required; PA submitted to above mentioned insurance via CoverMyMeds Key/confirmation #/EOC [id number] Status is pending

## 2024-06-10 ENCOUNTER — Other Ambulatory Visit (HOSPITAL_COMMUNITY): Payer: Self-pay

## 2024-06-10 ENCOUNTER — Ambulatory Visit

## 2024-06-10 NOTE — Telephone Encounter (Signed)
 Pharmacy Patient Advocate Encounter  Received notification from [facility] that Prior Authorization for Zepbound  2.5MG /0.5ML pen-injectors  has been APPROVED from 05/10/24 to 02/05/25. Unable to obtain price due to refill too soon rejection, last fill date 06/10/24 next available fill date08/13/25   PA #/Case ID/Reference #: [id number]

## 2024-06-12 ENCOUNTER — Ambulatory Visit

## 2024-06-16 ENCOUNTER — Ambulatory Visit: Payer: Self-pay

## 2024-06-16 NOTE — Telephone Encounter (Signed)
 FYI Only or Action Required?: FYI only for provider.  Patient was last seen in primary care on 06/03/2024 by Vicci Duwaine SQUIBB, DO.  Called Nurse Triage reporting Cough and Sinusitis.  Symptoms began several days ago.  Interventions attempted: OTC medications: Tylenol.  Symptoms are: stable.  Triage Disposition: Home Care  Patient/caregiver understands and will follow disposition?: Yes                             Copied from CRM [id number]. Topic: Clinical - Red Word Triage >> Jun 16, 2024  4:25 PM Tobias L wrote: Red Word that prompted transfer to Nurse Triage: possible sinus infection, scratchy throat, coughed up yellow possibly green phlegm Reason for Disposition  [1] Sinus congestion as part of a cold AND [2] present < 10 days  Answer Assessment - Initial Assessment Questions 1. LOCATION: Where does it hurt?      Denies 2. ONSET: When did the sinus pain start?  (e.g., hours, days)      Friday 3. SEVERITY: How bad is the pain?   (Scale 0-10; or none, mild, moderate or severe)     Denies 4. RECURRENT SYMPTOM: Have you ever had sinus problems before? If Yes, ask: When was the last time? and What happened that time?      Yes, states this is the second time this year 5. NASAL CONGESTION: Is the nose blocked? If Yes, ask: Can you open it or must you breathe through your mouth?     States nose is not completely stopped up 6. NASAL DISCHARGE: Do you have discharge from your nose? If so ask, What color?     Clear 7. FEVER: Do you have a fever? If Yes, ask: What is it, how was it measured, and when did it start?      Denies, recent temperature of 98.5 8. OTHER SYMPTOMS: Do you have any other symptoms? (e.g., sore throat, cough, earache, difficulty breathing)     Coughing up yellow/green phlegm, scratchy throat, denies earache    Advised patient to try a nasal decongestant, saline rinses or neti pot. Patient verbalized  understanding.  Protocols used: Sinus Pain or Congestion-A-AH

## 2024-06-17 NOTE — Telephone Encounter (Signed)
 Discussed with patient how she is feeling today. She does explain that she started feeling sick on Friday afternoon/evening and has since gotten worse. She no longer has a sore throat but congestion, scratchy voice/deep voice, flushed. We discussed scheduling an appointment and went ahead with next available. She will continue to take tylenol as needed as well as Claritin D. If she feels better prior to appointment she will cancel.

## 2024-06-19 ENCOUNTER — Ambulatory Visit: Admitting: Family Medicine

## 2024-06-24 ENCOUNTER — Telehealth: Payer: Self-pay

## 2024-06-24 NOTE — Telephone Encounter (Addendum)
 Discussed with patient as we received a message from [employer] that didn't make sense.   We went over her concerns with increasing her Zepbound  dose as tonight will be her 3rd dose. Overall she is doing well and has no concerns. She states losing 10 pounds so far and wants to be sure she does not lose to much to fast. She does know that last week she was sick and didn't eat much at all.  She will plan to go ahead with her next dose increase to be at 5 mg 07/02/2024 and let us  know how she is feeling at that time. I also plan to call her prior to her last week at 5 mg to see how she is doing. Asked that I just update the provider with this information.  She also lastly mentioned that she is considering asking to be started on something during the day as the mirtazapine  is working well but wonders if something in the day would help more. She is aware that in order to start something she would need a visit.

## 2024-06-25 NOTE — Telephone Encounter (Signed)
 We can discuss this at next visit. Thanks!

## 2024-07-08 ENCOUNTER — Ambulatory Visit: Admitting: Family Medicine

## 2024-07-08 ENCOUNTER — Encounter: Payer: Self-pay | Admitting: Family Medicine

## 2024-07-08 VITALS — BP 132/79 | HR 100 | Ht 59.5 in | Wt 180.0 lb

## 2024-07-08 DIAGNOSIS — E66812 Obesity, class 2: Secondary | ICD-10-CM

## 2024-07-08 DIAGNOSIS — Z6838 Body mass index (BMI) 38.0-38.9, adult: Secondary | ICD-10-CM

## 2024-07-08 DIAGNOSIS — E6609 Other obesity due to excess calories: Secondary | ICD-10-CM

## 2024-07-08 NOTE — Assessment & Plan Note (Signed)
 Patient down 13lbs since starting her zepbound . She is down 6.7% of her body weight. Will continue symptomatic care for her constipation and will increase her zepbound  to 5mg  and recheck in about 6 weeks.

## 2024-07-08 NOTE — Progress Notes (Signed)
 BP 132/79   Pulse 100   Ht 4' 11.5 (1.511 m)   Wt 180 lb (81.6 kg)   LMP 11/08/2021   SpO2 100%   BMI 35.75 kg/m    Subjective:    Patient ID: Sherry Gonzalez, female    DOB: 01/09/1971, 53 y.o.   MRN: [id number]  HPI: Sherry Gonzalez is a 53 y.o. female  Chief Complaint  Patient presents with   Obesity   OBESITY- has been a little constipated with the zepbound  Duration: chronic Previous attempts at weight loss: yes- portion control and exercise 3-5x a week Complications of obesity: depression, hypothyroidism Peak weight: 213lbs Weight loss goal: to be healthy Weight loss to date: 33 lbs Requesting obesity pharmacotherapy: unsure Current weight loss supplements/medications: no Previous weight loss supplements/meds: zepbound   Relevant past medical, surgical, family and social history reviewed and updated as indicated. Interim medical history since our last visit reviewed. Allergies and medications reviewed and updated.  Review of Systems  Constitutional: Negative.   Respiratory: Negative.    Cardiovascular: Negative.   Musculoskeletal: Negative.   Neurological:  Positive for numbness. Negative for dizziness, tremors, seizures, syncope, facial asymmetry, speech difficulty, weakness, light-headedness and headaches.  Psychiatric/Behavioral: Negative.      Per HPI unless specifically indicated above     Objective:    BP 132/79   Pulse 100   Ht 4' 11.5 (1.511 m)   Wt 180 lb (81.6 kg)   LMP 11/08/2021   SpO2 100%   BMI 35.75 kg/m   Wt Readings from Last 3 Encounters:  07/08/24 180 lb (81.6 kg)  06/03/24 193 lb 6.4 oz (87.7 kg)  04/03/24 199 lb 3.2 oz (90.4 kg)    Physical Exam Vitals and nursing note reviewed.  Constitutional:      General: She is not in acute distress.    Appearance: Normal appearance. She is obese. She is not ill-appearing, toxic-appearing or diaphoretic.  HENT:     Head: Normocephalic and atraumatic.     Right Ear: External ear normal.      Left Ear: External ear normal.     Nose: Nose normal.     Mouth/Throat:     Mouth: Mucous membranes are moist.     Pharynx: Oropharynx is clear.  Eyes:     General: No scleral icterus.       Right eye: No discharge.        Left eye: No discharge.     Extraocular Movements: Extraocular movements intact.     Conjunctiva/sclera: Conjunctivae normal.     Pupils: Pupils are equal, round, and reactive to light.  Cardiovascular:     Rate and Rhythm: Normal rate and regular rhythm.     Pulses: Normal pulses.     Heart sounds: Normal heart sounds. No murmur heard.    No friction rub. No gallop.  Pulmonary:     Effort: Pulmonary effort is normal. No respiratory distress.     Breath sounds: Normal breath sounds. No stridor. No wheezing, rhonchi or rales.  Chest:     Chest wall: No tenderness.  Musculoskeletal:        General: Normal range of motion.     Cervical back: Normal range of motion and neck supple.  Skin:    General: Skin is warm and dry.     Capillary Refill: Capillary refill takes less than 2 seconds.     Coloration: Skin is not jaundiced or pale.     Findings: No bruising, erythema, lesion  or rash.  Neurological:     General: No focal deficit present.     Mental Status: She is alert and oriented to person, place, and time. Mental status is at baseline.  Psychiatric:        Mood and Affect: Mood normal.        Behavior: Behavior normal.        Thought Content: Thought content normal.        Judgment: Judgment normal.     Results for orders placed or performed in visit on 06/03/24  ABO/Rh   Collection Time: 06/03/24  9:33 AM  Result Value Ref Range   ABO Grouping AB    Rh Factor Positive       Assessment & Plan:   Problem List Items Addressed This Visit       Other   Class 2 obesity due to excess calories without serious comorbidity with body mass index (BMI) of 38.0 to 38.9 in adult - Primary   Patient down 13lbs since starting her zepbound . She is down 6.7% of  her body weight. Will continue symptomatic care for her constipation and will increase her zepbound  to 5mg  and recheck in about 6 weeks.         Follow up plan: Return in about 6 weeks (around 08/19/2024).

## 2024-07-16 ENCOUNTER — Other Ambulatory Visit: Payer: Self-pay | Admitting: Family Medicine

## 2024-07-18 NOTE — Telephone Encounter (Signed)
 Appt- 08/12/24- RF a little early but will give #1 to get patient to upcoming appointment.  Requested Prescriptions  Pending Prescriptions Disp Refills   albuterol  (VENTOLIN  HFA) 108 (90 Base) MCG/ACT inhaler [Pharmacy Med Name: ALBUTEROL  HFA INH (200 PUFFS) 8.5GM] 8.5 g 0    Sig: INHALE 2 PUFFS INTO THE LUNGS EVERY 6 HOURS AS NEEDED FOR WHEEZING OR SHORTNESS OF BREATH     Pulmonology:  Beta Agonists 2 Passed - 07/18/2024 12:58 PM      Passed - Last BP in normal range    BP Readings from Last 1 Encounters:  07/08/24 132/79         Passed - Last Heart Rate in normal range    Pulse Readings from Last 1 Encounters:  07/08/24 100         Passed - Valid encounter within last 12 months    Recent Outpatient Visits           1 week ago Class 2 obesity due to excess calories without serious comorbidity with body mass index (BMI) of 38.0 to 38.9 in adult   [facility] [place], Megan P, DO   1 month ago Panic disorder without agoraphobia   [facility] [place], Megan P, DO   3 months ago Panic disorder without agoraphobia   [facility], Megan P, DO   3 months ago Panic disorder without agoraphobia   [facility] Melvin Pao, NP       Future Appointments             In 8 months Jackquline Sawyer, MD [facility]

## 2024-08-09 ENCOUNTER — Other Ambulatory Visit: Payer: Self-pay | Admitting: Family Medicine

## 2024-08-11 NOTE — Telephone Encounter (Signed)
 Requested Prescriptions  Pending Prescriptions Disp Refills   albuterol  (VENTOLIN  HFA) 108 (90 Base) MCG/ACT inhaler [Pharmacy Med Name: ALBUTEROL  HFA INH (200 PUFFS) 8.5GM] 8.5 g 2    Sig: INHALE 2 PUFFS INTO THE LUNGS EVERY 6 HOURS AS NEEDED FOR WHEEZING OR SHORTNESS OF BREATH     Pulmonology:  Beta Agonists 2 Passed - 08/11/2024  1:56 PM      Passed - Last BP in normal range    BP Readings from Last 1 Encounters:  07/08/24 132/79         Passed - Last Heart Rate in normal range    Pulse Readings from Last 1 Encounters:  07/08/24 100         Passed - Valid encounter within last 12 months    Recent Outpatient Visits           1 month ago Class 2 obesity due to excess calories without serious comorbidity with body mass index (BMI) of 38.0 to 38.9 in adult   [facility] [place], Megan P, DO   2 months ago Panic disorder without agoraphobia   [facility] [place], Megan P, DO   4 months ago Panic disorder without agoraphobia   [place] [facility] [place], Megan P, DO   4 months ago Panic disorder without agoraphobia   [place] [facility] Melvin Pao, NP       Future Appointments             In 7 months Jackquline Sawyer, MD [facility] [place]

## 2024-08-12 ENCOUNTER — Encounter: Payer: Self-pay | Admitting: Family Medicine

## 2024-08-12 ENCOUNTER — Ambulatory Visit: Admitting: Family Medicine

## 2024-08-12 VITALS — BP 136/76 | HR 88 | Ht 59.5 in | Wt 169.8 lb

## 2024-08-12 DIAGNOSIS — E66811 Obesity, class 1: Secondary | ICD-10-CM

## 2024-08-12 DIAGNOSIS — Z23 Encounter for immunization: Secondary | ICD-10-CM

## 2024-08-12 DIAGNOSIS — Z6833 Body mass index (BMI) 33.0-33.9, adult: Secondary | ICD-10-CM | POA: Diagnosis not present

## 2024-08-12 DIAGNOSIS — E6609 Other obesity due to excess calories: Secondary | ICD-10-CM | POA: Diagnosis not present

## 2024-08-12 MED ORDER — ZEPBOUND 5 MG/0.5ML ~~LOC~~ SOAJ: 5.0000 mg | SUBCUTANEOUS | 1 refills | Status: DC

## 2024-08-12 NOTE — Progress Notes (Unsigned)
 BP 136/76 (BP Location: Left Arm, Patient Position: Sitting, Cuff Size: Large)   Pulse 88   Ht 4' 11.5 (1.511 m)   Wt 169 lb 12.8 oz (77 kg)   LMP 11/08/2021   SpO2 99%   BMI 33.72 kg/m    Subjective:    Patient ID: Sherry Gonzalez, female    DOB: 1971/06/10, 53 y.o.   MRN: [id number]  HPI: Sherry Gonzalez is a 53 y.o. female  Chief Complaint  Patient presents with  . Obesity    Would like to discuss zepbound  and the fact that insurance might stop paying starting next year    OBESITY- has been a little constipated with the zepbound  Duration: chronic Previous attempts at weight loss: yes- portion control and exercise 3-5x a week Complications of obesity: depression, hypothyroidism Peak weight: 213lbs Weight loss goal: 130 Weight loss to date: 44 lbs Requesting obesity pharmacotherapy: unsure Current weight loss supplements/medications: no Previous weight loss supplements/meds: zepbound   Relevant past medical, surgical, family and social history reviewed and updated as indicated. Interim medical history since our last visit reviewed. Allergies and medications reviewed and updated.  Review of Systems  Constitutional: Negative.   Respiratory: Negative.    Cardiovascular: Negative.   Musculoskeletal: Negative.   Skin: Negative.   Psychiatric/Behavioral: Negative.      Per HPI unless specifically indicated above     Objective:    BP 136/76 (BP Location: Left Arm, Patient Position: Sitting, Cuff Size: Large)   Pulse 88   Ht 4' 11.5 (1.511 m)   Wt 169 lb 12.8 oz (77 kg)   LMP 11/08/2021   SpO2 99%   BMI 33.72 kg/m   Wt Readings from Last 3 Encounters:  08/12/24 169 lb 12.8 oz (77 kg)  07/08/24 180 lb (81.6 kg)  06/03/24 193 lb 6.4 oz (87.7 kg)    Physical Exam Vitals and nursing note reviewed.  Constitutional:      General: She is not in acute distress.    Appearance: Normal appearance. She is obese. She is not ill-appearing, toxic-appearing or diaphoretic.  HENT:      Head: Normocephalic and atraumatic.     Right Ear: External ear normal.     Left Ear: External ear normal.     Nose: Nose normal.     Mouth/Throat:     Mouth: Mucous membranes are moist.     Pharynx: Oropharynx is clear.  Eyes:     General: No scleral icterus.       Right eye: No discharge.        Left eye: No discharge.     Extraocular Movements: Extraocular movements intact.     Conjunctiva/sclera: Conjunctivae normal.     Pupils: Pupils are equal, round, and reactive to light.  Cardiovascular:     Rate and Rhythm: Normal rate and regular rhythm.     Pulses: Normal pulses.     Heart sounds: Normal heart sounds. No murmur heard.    No friction rub. No gallop.  Pulmonary:     Effort: Pulmonary effort is normal. No respiratory distress.     Breath sounds: Normal breath sounds. No stridor. No wheezing, rhonchi or rales.  Chest:     Chest wall: No tenderness.  Musculoskeletal:        General: Normal range of motion.     Cervical back: Normal range of motion and neck supple.  Skin:    General: Skin is warm and dry.     Capillary Refill: Capillary  refill takes less than 2 seconds.     Coloration: Skin is not jaundiced or pale.     Findings: No bruising, erythema, lesion or rash.  Neurological:     General: No focal deficit present.     Mental Status: She is alert and oriented to person, place, and time. Mental status is at baseline.  Psychiatric:        Mood and Affect: Mood normal.        Behavior: Behavior normal.        Thought Content: Thought content normal.        Judgment: Judgment normal.     Results for orders placed or performed in visit on 06/03/24  ABO/Rh   Collection Time: 06/03/24  9:33 AM  Result Value Ref Range   ABO Grouping AB    Rh Factor Positive       Assessment & Plan:   Problem List Items Addressed This Visit       Other   Class 1 obesity due to excess calories with body mass index (BMI) of 33.0 to 33.9 in adult - Primary   Relevant  Medications   tirzepatide  (ZEPBOUND ) 5 MG/0.5ML Pen   Other Visit Diagnoses       Needs flu shot       Relevant Orders   Flu vaccine trivalent PF, 6mos and older(Flulaval,Afluria,Fluarix,Fluzone)        Follow up plan: Return in about 6 weeks (around 09/23/2024).

## 2024-08-12 NOTE — Assessment & Plan Note (Signed)
 Doing great! Has lost 44lbs! Down 20% of her body weight. Will continue current regimen and recheck 6 weeks. Call with any concerns.

## 2024-08-14 ENCOUNTER — Ambulatory Visit: Admitting: Family Medicine

## 2024-09-04 ENCOUNTER — Encounter: Payer: Self-pay | Admitting: Family Medicine

## 2024-09-10 DIAGNOSIS — L650 Telogen effluvium: Secondary | ICD-10-CM | POA: Diagnosis not present

## 2024-09-10 DIAGNOSIS — Z7189 Other specified counseling: Secondary | ICD-10-CM | POA: Diagnosis not present

## 2024-09-10 DIAGNOSIS — L218 Other seborrheic dermatitis: Secondary | ICD-10-CM | POA: Diagnosis not present

## 2024-09-23 ENCOUNTER — Encounter: Payer: Self-pay | Admitting: Family Medicine

## 2024-09-23 ENCOUNTER — Ambulatory Visit: Admitting: Family Medicine

## 2024-09-23 VITALS — BP 128/76 | HR 81 | Temp 98.0°F | Ht 59.5 in | Wt 160.0 lb

## 2024-09-23 DIAGNOSIS — E66811 Other obesity due to excess calories: Secondary | ICD-10-CM

## 2024-09-23 DIAGNOSIS — E6609 Other obesity due to excess calories: Secondary | ICD-10-CM

## 2024-09-23 DIAGNOSIS — Z6833 Body mass index (BMI) 33.0-33.9, adult: Secondary | ICD-10-CM | POA: Diagnosis not present

## 2024-09-23 DIAGNOSIS — F410 Panic disorder [episodic paroxysmal anxiety] without agoraphobia: Secondary | ICD-10-CM | POA: Diagnosis not present

## 2024-09-23 MED ORDER — MIRTAZAPINE 15 MG PO TABS: 22.5000 mg | ORAL_TABLET | Freq: Every day | ORAL | 0 refills | Status: DC

## 2024-09-23 MED ORDER — VITAMIN D (ERGOCALCIFEROL) 1.25 MG (50000 UNIT) PO CAPS: 50000.0000 [IU] | ORAL_CAPSULE | ORAL | 0 refills | Status: AC

## 2024-09-23 MED ORDER — ZEPBOUND 5 MG/0.5ML ~~LOC~~ SOAJ: 5.0000 mg | SUBCUTANEOUS | 1 refills | Status: AC

## 2024-09-23 MED ORDER — ALPRAZOLAM 0.25 MG PO TABS: ORAL_TABLET | ORAL | 0 refills | Status: AC

## 2024-09-23 NOTE — Assessment & Plan Note (Signed)
 Congratulated patient on further 9lb weight loss for 53lbs down total. Will continue zepbound  and recheck in 6 weeks at her physical.

## 2024-09-23 NOTE — Progress Notes (Signed)
 BP 128/76   Pulse 81   Temp 98 F (36.7 C) (Oral)   Ht 4' 11.5 (1.511 m)   Wt 160 lb (72.6 kg)   LMP 11/08/2021   SpO2 98%   BMI 31.78 kg/m    Subjective:    Patient ID: Sherry Gonzalez, female    DOB: Mar 31, 1971, 53 y.o.   MRN: [id number]  HPI: Sherry Gonzalez is a 53 y.o. female  Chief Complaint  Patient presents with   Obesity   Anxiety   OBESITY- having a little hair loss, some constipation Duration: chronic Previous attempts at weight loss: yes- portion control and exercise 3-5x a week Complications of obesity: depression, hypothyroidism Peak weight: 213lbs Weight loss goal: 130 Weight loss to date: 53 lbs!!! Requesting obesity pharmacotherapy: unsure Current weight loss supplements/medications: no Previous weight loss supplements/meds: zepbound   ANXIETY/STRESS Duration: chronic Status:stable Anxious mood: yes  Excessive worrying: yes Irritability: no  Sweating: no Nausea: no Palpitations:no Hyperventilation: no Panic attacks: no Agoraphobia: no  Obscessions/compulsions: no Depressed mood: no    07/08/2024    3:20 PM 06/03/2024    8:35 AM 04/03/2024    1:24 PM 03/27/2024   11:32 AM 10/05/2023    8:21 AM  Depression screen PHQ 2/9  Decreased Interest 0 0 0 0 0  Down, Depressed, Hopeless 0 0 0 0 0  PHQ - 2 Score 0 0 0 0 0  Altered sleeping 0 0 0 0 0  Tired, decreased energy 0 0 0 0 0  Change in appetite 0 0 0 0 0  Feeling bad or failure about yourself  0 0 0 0 0  Trouble concentrating 0 0 0 0 0  Moving slowly or fidgety/restless 0 0 0 0 0  Suicidal thoughts 0 0 0 0 0  PHQ-9 Score 0 0 0 0 0  Difficult doing work/chores     Not difficult at all   Anhedonia: no Weight changes: no Insomnia: yes   Hypersomnia: no Fatigue/loss of energy: yes Feelings of worthlessness: no Feelings of guilt: no Impaired concentration/indecisiveness: no Suicidal ideations: no  Crying spells: no Recent Stressors/Life Changes: no   Relationship problems: no   Family  stress: no     Financial stress: no    Job stress: yes    Recent death/loss: no   Relevant past medical, surgical, family and social history reviewed and updated as indicated. Interim medical history since our last visit reviewed. Allergies and medications reviewed and updated.  Review of Systems  Constitutional: Negative.   Respiratory: Negative.    Cardiovascular: Negative.   Musculoskeletal: Negative.   Neurological: Negative.   Psychiatric/Behavioral: Negative.      Per HPI unless specifically indicated above     Objective:    BP 128/76   Pulse 81   Temp 98 F (36.7 C) (Oral)   Ht 4' 11.5 (1.511 m)   Wt 160 lb (72.6 kg)   LMP 11/08/2021   SpO2 98%   BMI 31.78 kg/m   Wt Readings from Last 3 Encounters:  09/23/24 160 lb (72.6 kg)  08/12/24 169 lb 12.8 oz (77 kg)  07/08/24 180 lb (81.6 kg)    Physical Exam Vitals and nursing note reviewed.  Constitutional:      General: She is not in acute distress.    Appearance: Normal appearance. She is obese. She is not ill-appearing, toxic-appearing or diaphoretic.  HENT:     Head: Normocephalic and atraumatic.     Right Ear: External ear normal.  Left Ear: External ear normal.     Nose: Nose normal.     Mouth/Throat:     Mouth: Mucous membranes are moist.     Pharynx: Oropharynx is clear.  Eyes:     General: No scleral icterus.       Right eye: No discharge.        Left eye: No discharge.     Extraocular Movements: Extraocular movements intact.     Conjunctiva/sclera: Conjunctivae normal.     Pupils: Pupils are equal, round, and reactive to light.  Cardiovascular:     Rate and Rhythm: Normal rate and regular rhythm.     Pulses: Normal pulses.     Heart sounds: Normal heart sounds. No murmur heard.    No friction rub. No gallop.  Pulmonary:     Effort: Pulmonary effort is normal. No respiratory distress.     Breath sounds: Normal breath sounds. No stridor. No wheezing, rhonchi or rales.  Chest:     Chest  wall: No tenderness.  Musculoskeletal:        General: Normal range of motion.     Cervical back: Normal range of motion and neck supple.  Skin:    General: Skin is warm and dry.     Capillary Refill: Capillary refill takes less than 2 seconds.     Coloration: Skin is not jaundiced or pale.     Findings: No bruising, erythema, lesion or rash.  Neurological:     General: No focal deficit present.     Mental Status: She is alert and oriented to person, place, and time. Mental status is at baseline.  Psychiatric:        Mood and Affect: Mood normal.        Behavior: Behavior normal.        Thought Content: Thought content normal.        Judgment: Judgment normal.     Results for orders placed or performed in visit on 06/03/24  ABO/Rh   Collection Time: 06/03/24  9:33 AM  Result Value Ref Range   ABO Grouping AB    Rh Factor Positive       Assessment & Plan:   Problem List Items Addressed This Visit       Other   Anxiety disorder - Primary   Not doing well. Will increase her remeron  to 1.5 pills and recheck in 6 weeks. Call with any concerns.       Relevant Medications   mirtazapine  (REMERON ) 15 MG tablet   ALPRAZolam  (XANAX ) 0.25 MG tablet   Class 1 obesity due to excess calories with body mass index (BMI) of 33.0 to 33.9 in adult   Congratulated patient on further 9lb weight loss for 53lbs down total. Will continue zepbound  and recheck in 6 weeks at her physical.       Relevant Medications   tirzepatide  (ZEPBOUND ) 5 MG/0.5ML Pen     Follow up plan: Return in about 6 weeks (around 11/04/2024) for physical, OK to move appt on 11/26.

## 2024-09-23 NOTE — Assessment & Plan Note (Signed)
 Not doing well. Will increase her remeron  to 1.5 pills and recheck in 6 weeks. Call with any concerns.

## 2024-10-15 ENCOUNTER — Encounter: Admitting: Family Medicine

## 2024-10-18 ENCOUNTER — Other Ambulatory Visit: Payer: Self-pay | Admitting: Family Medicine

## 2024-10-22 NOTE — Telephone Encounter (Signed)
 Requested Prescriptions  Pending Prescriptions Disp Refills   albuterol  (VENTOLIN  HFA) 108 (90 Base) MCG/ACT inhaler [Pharmacy Med Name: ALBUTEROL  HFA INH (200 PUFFS) 8.5GM] 8.5 g 0    Sig: INHALE 2 PUFFS INTO THE LUNGS EVERY 6 HOURS AS NEEDED FOR WHEEZING OR SHORTNESS OF BREATH     Pulmonology:  Beta Agonists 2 Passed - 10/22/2024 12:54 PM      Passed - Last BP in normal range    BP Readings from Last 1 Encounters:  09/23/24 128/76         Passed - Last Heart Rate in normal range    Pulse Readings from Last 1 Encounters:  09/23/24 81         Passed - Valid encounter within last 12 months    Recent Outpatient Visits           4 weeks ago Panic disorder without agoraphobia   [facility] [place], Megan P, DO   2 months ago Class 1 obesity due to excess calories with serious comorbidity and body mass index (BMI) of 33.0 to 33.9 in adult   [facility], Megan P, DO   3 months ago Class 2 obesity due to excess calories without serious comorbidity with body mass index (BMI) of 38.0 to 38.9 in adult   [facility] [place], Megan P, DO   4 months ago Panic disorder without agoraphobia   [place] [facility] [place], Megan P, DO   6 months ago Panic disorder without agoraphobia    [facility] Vicci Duwaine SQUIBB, DO       Future Appointments             In 4 months Jackquline Sawyer, MD [facility]

## 2024-11-04 ENCOUNTER — Encounter: Admitting: Family Medicine

## 2024-11-07 ENCOUNTER — Other Ambulatory Visit (HOSPITAL_COMMUNITY)
Admission: RE | Admit: 2024-11-07 | Discharge: 2024-11-07 | Disposition: A | Discharge status: Home / Self Care | Source: Ambulatory Visit | Attending: Family Medicine | Admitting: Family Medicine

## 2024-11-07 ENCOUNTER — Ambulatory Visit: Admitting: Family Medicine

## 2024-11-07 ENCOUNTER — Encounter: Payer: Self-pay | Admitting: Family Medicine

## 2024-11-07 VITALS — BP 144/88 | HR 87 | Temp 97.5°F | Ht 59.5 in | Wt 151.0 lb

## 2024-11-07 DIAGNOSIS — Z23 Encounter for immunization: Secondary | ICD-10-CM | POA: Diagnosis not present

## 2024-11-07 DIAGNOSIS — Z0000 Encounter for general adult medical examination without abnormal findings: Secondary | ICD-10-CM

## 2024-11-07 DIAGNOSIS — E039 Hypothyroidism, unspecified: Secondary | ICD-10-CM | POA: Diagnosis not present

## 2024-11-07 DIAGNOSIS — E663 Overweight: Secondary | ICD-10-CM

## 2024-11-07 DIAGNOSIS — F410 Panic disorder [episodic paroxysmal anxiety] without agoraphobia: Secondary | ICD-10-CM

## 2024-11-07 DIAGNOSIS — E559 Vitamin D deficiency, unspecified: Secondary | ICD-10-CM | POA: Diagnosis not present

## 2024-11-07 DIAGNOSIS — Z1231 Encounter for screening mammogram for malignant neoplasm of breast: Secondary | ICD-10-CM

## 2024-11-07 LAB — BAYER DCA HB A1C WAIVED: HB A1C (BAYER DCA - WAIVED): 4.7 % — ABNORMAL LOW (ref 4.8–5.6)

## 2024-11-07 MED ORDER — TIRZEPATIDE-WEIGHT MANAGEMENT 5 MG/0.5ML ~~LOC~~ SOLN: 5.0000 mg | SUBCUTANEOUS | 6 refills | Status: AC

## 2024-11-07 NOTE — Assessment & Plan Note (Signed)
 Congratulated patient on 62lb weight loss! Insurance will not cover zepbound  in the new year- will send Rx to [facility]. Call with any concerns.

## 2024-11-07 NOTE — Progress Notes (Signed)
 "  BP (!) 144/88   Pulse 87   Temp (!) 97.5 F (36.4 C) (Oral)   Ht 4' 11.5 (1.511 m)   Wt 151 lb (68.5 kg)   LMP 11/08/2021   SpO2 98%   BMI 29.99 kg/m    Subjective:    Patient ID: Sherry Gonzalez, female    DOB: 13-Mar-1971, 53 y.o.   MRN: [id number]  HPI: Sherry Gonzalez is a 53 y.o. female presenting on 11/07/2024 for comprehensive medical examination. Current medical complaints include:  ANXIETY/DEPRESSION- only took the higher dose of the remeron  for a week then noticed that her stomach was growling, but feels like it was helping so is thinking about restarting it. Duration: chronic Status: stable Anxious mood: yes  Excessive worrying: yes Irritability: no  Sweating: yes Nausea: no Palpitations:yes Hyperventilation: no Panic attacks: no Agoraphobia: no  Obscessions/compulsions: no Depressed mood: yes    07/08/2024    3:20 PM 06/03/2024    8:35 AM 04/03/2024    1:24 PM 03/27/2024   11:32 AM 10/05/2023    8:21 AM  Depression screen PHQ 2/9  Decreased Interest 0 0 0 0 0  Down, Depressed, Hopeless 0 0 0 0 0  PHQ - 2 Score 0 0 0 0 0  Altered sleeping 0 0 0 0 0  Tired, decreased energy 0 0 0 0 0  Change in appetite 0 0 0 0 0  Feeling bad or failure about yourself  0 0 0 0 0  Trouble concentrating 0 0 0 0 0  Moving slowly or fidgety/restless 0 0 0 0 0  Suicidal thoughts 0 0 0 0 0  PHQ-9 Score 0  0  0  0  0   Difficult doing work/chores     Not difficult at all     Data saved with a previous flowsheet row definition   Anhedonia: no Weight changes: yes Insomnia: no   Hypersomnia: no Fatigue/loss of energy: yes Feelings of worthlessness: yes Feelings of guilt: yes Impaired concentration/indecisiveness: no Suicidal ideations: no  Crying spells: no Recent Stressors/Life Changes: no   Relationship problems: no   Family stress: no     Financial stress: no    Job stress: no    Recent death/loss: no  OBESITY- insurance is going to stop covering her zepbound  so she'd  like to switch over to [facility] Duration: chronic Previous attempts at weight loss: yes- portion control and exercise 3-5x a week Complications of obesity: depression, hypothyroidism Peak weight: 213lbs Weight loss goal: 130 Weight loss to date: 62 lbs!!! Requesting obesity pharmacotherapy: unsure Current weight loss supplements/medications: no Previous weight loss supplements/meds: zepbound    HYPOTHYROIDISM Thyroid  control status:controlled Satisfied with current treatment? yes Medication side effects: no Medication compliance: excellent compliance Recent dose adjustment:no Fatigue: no Cold intolerance: no Heat intolerance: no Weight gain: no Weight loss: no Constipation: no Diarrhea/loose stools: no Palpitations: no Lower extremity edema: no Anxiety/depressed mood: yes   She currently lives with: alone Menopausal Symptoms: no  Depression Screen done today and results listed below:     07/08/2024    3:20 PM 06/03/2024    8:35 AM 04/03/2024    1:24 PM 03/27/2024   11:32 AM 10/05/2023    8:21 AM  Depression screen PHQ 2/9  Decreased Interest 0 0 0 0 0  Down, Depressed, Hopeless 0 0 0 0 0  PHQ - 2 Score 0 0 0 0 0  Altered sleeping 0 0 0 0 0  Tired, decreased energy 0 0  0 0 0  Change in appetite 0 0 0 0 0  Feeling bad or failure about yourself  0 0 0 0 0  Trouble concentrating 0 0 0 0 0  Moving slowly or fidgety/restless 0 0 0 0 0  Suicidal thoughts 0 0 0 0 0  PHQ-9 Score 0  0  0  0  0   Difficult doing work/chores     Not difficult at all     Data saved with a previous flowsheet row definition    Past Medical History:  Past Medical History:  Diagnosis Date   Allergy-induced asthma    Anxiety 04/03/2016   Carpal tunnel syndrome, left    left upper limbt   Carpal tunnel syndrome, right    right upper limb   Eustachian tube disorder    left ear   Hand eczema    Hypothyroidism    Mild intermittent asthma    Trigger ring finger of left hand     Surgical  History:  Past Surgical History:  Procedure Laterality Date   COLONOSCOPY WITH PROPOFOL  N/A 10/11/2020   Procedure: COLONOSCOPY WITH PROPOFOL ;  Surgeon: Jinny Carmine, MD;  Location: [facility];  Service: Endoscopy;  Laterality: N/A;   WISDOM TOOTH EXTRACTION      Medications:  Current Outpatient Medications on File Prior to Visit  Medication Sig   acetaminophen (TYLENOL) 325 MG tablet Take 325 mg by mouth as needed.   albuterol  (VENTOLIN  HFA) 108 (90 Base) MCG/ACT inhaler INHALE 2 PUFFS INTO THE LUNGS EVERY 6 HOURS AS NEEDED FOR WHEEZING OR SHORTNESS OF BREATH   ALPRAZolam  (XANAX ) 0.25 MG tablet TAKE 1/2 TABLET BY MOUTH DAILY AS NEEDED FOR ANXIETY AND 1/2 TABLET AT NIGHT AS NEEDED FOR SLEEP   budesonide -formoterol  (SYMBICORT ) 160-4.5 MCG/ACT inhaler Inhale 2 puffs into the lungs 2 (two) times daily.   ketoconazole  (NIZORAL ) 2 % cream Apply at bedtime to skin folds as needed.   levothyroxine  (SYNTHROID ) 50 MCG tablet Take 1 tablet (50 mcg total) by mouth daily before breakfast.   mirtazapine  (REMERON ) 15 MG tablet Take 1.5 tablets (22.5 mg total) by mouth at bedtime.   tirzepatide  (ZEPBOUND ) 5 MG/0.5ML Pen Inject 5 mg into the skin once a week.   Vitamin D , Ergocalciferol , (DRISDOL ) 1.25 MG (50000 UNIT) CAPS capsule Take 1 capsule (50,000 Units total) by mouth every 7 (seven) days.   No current facility-administered medications on file prior to visit.    Allergies:  Allergies[1]  Social History:  Social History   Socioeconomic History   Marital status: Single    Spouse name: Not on file   Number of children: Not on file   Years of education: Not on file   Highest education level: Master's degree (e.g., MA, MS, MEng, MEd, MSW, MBA)  Occupational History   Not on file  Tobacco Use   Smoking status: Never   Smokeless tobacco: Never  Vaping Use   Vaping status: Never Used  Substance and Sexual Activity   Alcohol use: Never   Drug use: Never   Sexual activity: Never   Other Topics Concern   Not on file  Social History Narrative   Not on file   Social Drivers of Health   Tobacco Use: Low Risk (11/07/2024)   Patient History    Smoking Tobacco Use: Never    Smokeless Tobacco Use: Never    Passive Exposure: Not on file  Financial Resource Strain: Low Risk (09/22/2024)   Overall Financial Resource Strain (CARDIA)  Difficulty of Paying Living Expenses: Not hard at all  Food Insecurity: No Food Insecurity (09/22/2024)   Epic    Worried About Programme Researcher, Broadcasting/film/video in the Last Year: Never true    Ran Out of Food in the Last Year: Never true  Transportation Needs: No Transportation Needs (09/22/2024)   Epic    Lack of Transportation (Medical): No    Lack of Transportation (Non-Medical): No  Physical Activity: Inactive (09/22/2024)   Exercise Vital Sign    Days of Exercise per Week: 0 days    Minutes of Exercise per Session: Not on file  Stress: Stress Concern Present (09/22/2024)   [employer] of Occupational Health - Occupational Stress Questionnaire    Feeling of Stress: To some extent  Social Connections: Moderately Integrated (09/22/2024)   Social Connection and Isolation Panel    Frequency of Communication with Friends and Family: More than three times a week    Frequency of Social Gatherings with Friends and Family: Once a week    Attends Religious Services: More than 4 times per year    Active Member of Clubs or Organizations: Yes    Attends Banker Meetings: 1 to 4 times per year    Marital Status: Never married  Intimate Partner Violence: Not At Risk (11/07/2024)   Epic    Fear of Current or Ex-Partner: No    Emotionally Abused: No    Physically Abused: No    Sexually Abused: No  Depression (PHQ2-9): Low Risk (07/08/2024)   Depression (PHQ2-9)    PHQ-2 Score: 0  Alcohol Screen: Low Risk (11/07/2024)   Alcohol Screen    Last Alcohol Screening Score (AUDIT): 0  Housing: Low Risk (11/07/2024)   Epic    Unable to Pay  for Housing in the Last Year: No    Number of Times Moved in the Last Year: 0    Homeless in the Last Year: No  Utilities: Not At Risk (11/07/2024)   Epic    Threatened with loss of utilities: No  Health Literacy: Adequate Health Literacy (11/07/2024)   B1300 Health Literacy    Frequency of need for help with medical instructions: Never   Tobacco Use History[2] Social History   Substance and Sexual Activity  Alcohol Use Never    Family History:  Family History  Problem Relation Age of Onset   Alcohol abuse Father    Ehlers-Danlos syndrome Mother        Mothers side of the family   Migraines Sister    Breast cancer Neg Hx     Past medical history, surgical history, medications, allergies, family history and social history reviewed with patient today and changes made to appropriate areas of the chart.   Review of Systems  Constitutional: Negative.   HENT: Negative.    Eyes: Negative.   Respiratory: Negative.    Cardiovascular: Negative.   Gastrointestinal: Negative.   Genitourinary: Negative.   Musculoskeletal: Negative.   Skin: Negative.   Neurological: Negative.   Endo/Heme/Allergies: Negative.   Psychiatric/Behavioral:  Negative for depression, hallucinations, memory loss, substance abuse and suicidal ideas. The patient is nervous/anxious. The patient does not have insomnia.    All other ROS negative except what is listed above and in the HPI.      Objective:    BP (!) 144/88   Pulse 87   Temp (!) 97.5 F (36.4 C) (Oral)   Ht 4' 11.5 (1.511 m)   Wt 151 lb (68.5 kg)  LMP 11/08/2021   SpO2 98%   BMI 29.99 kg/m   Wt Readings from Last 3 Encounters:  11/07/24 151 lb (68.5 kg)  09/23/24 160 lb (72.6 kg)  08/12/24 169 lb 12.8 oz (77 kg)    Physical Exam Vitals and nursing note reviewed. Exam conducted with a chaperone present.  Constitutional:      General: She is not in acute distress.    Appearance: Normal appearance. She is not ill-appearing,  toxic-appearing or diaphoretic.  HENT:     Head: Normocephalic and atraumatic.     Right Ear: Tympanic membrane, ear canal and external ear normal. There is no impacted cerumen.     Left Ear: Tympanic membrane, ear canal and external ear normal. There is no impacted cerumen.     Nose: Nose normal. No congestion or rhinorrhea.     Mouth/Throat:     Mouth: Mucous membranes are moist.     Pharynx: Oropharynx is clear. No oropharyngeal exudate or posterior oropharyngeal erythema.  Eyes:     General: No scleral icterus.       Right eye: No discharge.        Left eye: No discharge.     Extraocular Movements: Extraocular movements intact.     Conjunctiva/sclera: Conjunctivae normal.     Pupils: Pupils are equal, round, and reactive to light.  Neck:     Vascular: No carotid bruit.  Cardiovascular:     Rate and Rhythm: Normal rate and regular rhythm.     Pulses: Normal pulses.     Heart sounds: No murmur heard.    No friction rub. No gallop.  Pulmonary:     Effort: Pulmonary effort is normal. No respiratory distress.     Breath sounds: Normal breath sounds. No stridor. No wheezing, rhonchi or rales.  Chest:     Chest wall: No tenderness.  Abdominal:     General: Abdomen is flat. Bowel sounds are normal. There is no distension.     Palpations: Abdomen is soft. There is no mass.     Tenderness: There is no abdominal tenderness. There is no right CVA tenderness, left CVA tenderness, guarding or rebound.     Hernia: No hernia is present. There is no hernia in the left inguinal area or right inguinal area.  Genitourinary:    Labia:        Right: No rash, tenderness, lesion or injury.        Left: No rash, tenderness, lesion or injury.      Vagina: Normal.     Cervix: Normal.     Uterus: Normal.      Adnexa: Right adnexa normal and left adnexa normal.     Comments: Breast exams deferred with shared decision making Musculoskeletal:        General: No swelling, tenderness, deformity or  signs of injury.     Cervical back: Normal range of motion and neck supple. No rigidity. No muscular tenderness.     Right lower leg: No edema.     Left lower leg: No edema.  Lymphadenopathy:     Cervical: No cervical adenopathy.     Lower Body: No right inguinal adenopathy. No left inguinal adenopathy.  Skin:    General: Skin is warm and dry.     Capillary Refill: Capillary refill takes less than 2 seconds.     Coloration: Skin is not jaundiced or pale.     Findings: No bruising, erythema, lesion or rash.  Neurological:  General: No focal deficit present.     Mental Status: She is alert and oriented to person, place, and time. Mental status is at baseline.     Cranial Nerves: No cranial nerve deficit.     Sensory: No sensory deficit.     Motor: No weakness.     Coordination: Coordination normal.     Gait: Gait normal.     Deep Tendon Reflexes: Reflexes normal.  Psychiatric:        Mood and Affect: Mood normal.        Behavior: Behavior normal.        Thought Content: Thought content normal.        Judgment: Judgment normal.     Results for orders placed or performed in visit on 11/07/24  Bayer DCA Hb A1c Waived   Collection Time: 11/07/24 10:26 AM  Result Value Ref Range   HB A1C (BAYER DCA - WAIVED) 4.7 (L) 4.8 - 5.6 %      Assessment & Plan:   Problem List Items Addressed This Visit       Endocrine   Hypothyroid   Rechecking labs today. Await results. Treat as needed.       Relevant Orders   TSH     Other   Anxiety disorder   Not under great control. Will go back up on her remeron  and recheck in about 6 weeks. Call with any concerns.       Vitamin D  deficiency   Rechecking labs today. Await results. Treat as needed.       Overweight (BMI 25.0-29.9)   Congratulated patient on 62lb weight loss! Insurance will not cover zepbound  in the new year- will send Rx to [facility]. Call with any concerns.       Other Visit Diagnoses       Routine general  medical examination at a health care facility    -  Primary   Vaccines up to date/declined. Pap done. Screening labs checked today. Mammo ordered. Continue diet and exercise. Call with any concerns.   Relevant Orders   CBC with Differential/Platelet   Comprehensive metabolic panel with GFR   Lipid Panel w/o Chol/HDL Ratio   Cytology - PAP   TSH   Bayer DCA Hb A1c Waived (Completed)   VITAMIN D  25 Hydroxy (Vit-D Deficiency, Fractures)   Hepatitis B surface antibody,quantitative     Encounter for screening mammogram for malignant neoplasm of breast       Mammogram ordered today.   Relevant Orders   MM 3D SCREENING MAMMOGRAM BILATERAL BREAST        Follow up plan: Return in about 3 months (around 02/05/2025).   LABORATORY TESTING:  - Pap smear: pap done  IMMUNIZATIONS:   - Tdap: Tetanus vaccination status reviewed: Tdap vaccination indicated and given today. - Influenza: Up to date - Prevnar: Refused - COVID: Not applicable - HPV: Not applicable - Shingrix  vaccine: Up to date  SCREENING: -Mammogram: Up to date  - Colonoscopy: Up to date   PATIENT COUNSELING:   Advised to take 1 mg of folate supplement per day if capable of pregnancy.   Sexuality: Discussed sexually transmitted diseases, partner selection, use of condoms, avoidance of unintended pregnancy  and contraceptive alternatives.   Advised to avoid cigarette smoking.  I discussed with the patient that most people either abstain from alcohol or drink within safe limits (<=14/week and <=4 drinks/occasion for males, <=7/weeks and <= 3 drinks/occasion for females) and that the risk  for alcohol disorders and other health effects rises proportionally with the number of drinks per week and how often a drinker exceeds daily limits.  Discussed cessation/primary prevention of drug use and availability of treatment for abuse.   Diet: Encouraged to adjust caloric intake to maintain  or achieve ideal body weight, to reduce  intake of dietary saturated fat and total fat, to limit sodium intake by avoiding high sodium foods and not adding table salt, and to maintain adequate dietary potassium and calcium preferably from fresh fruits, vegetables, and low-fat dairy products.    stressed the importance of regular exercise  Injury prevention: Discussed safety belts, safety helmets, smoke detector, smoking near bedding or upholstery.   Dental health: Discussed importance of regular tooth brushing, flossing, and dental visits.    NEXT PREVENTATIVE PHYSICAL DUE IN 1 YEAR. Return in about 3 months (around 02/05/2025).               [1] No Known Allergies [2]  Social History Tobacco Use  Smoking Status Never  Smokeless Tobacco Never   "

## 2024-11-07 NOTE — Assessment & Plan Note (Signed)
 Not under great control. Will go back up on her remeron  and recheck in about 6 weeks. Call with any concerns.

## 2024-11-07 NOTE — Assessment & Plan Note (Signed)
 Rechecking labs today. Await results. Treat as needed.

## 2024-11-08 LAB — CBC WITH DIFFERENTIAL/PLATELET
Basophils Absolute: 0 x10E3/uL (ref 0.0–0.2)
Basos: 0 %
EOS (ABSOLUTE): 0.5 x10E3/uL — ABNORMAL HIGH (ref 0.0–0.4)
Eos: 8 %
Hematocrit: 43.6 % (ref 34.0–46.6)
Hemoglobin: 14.2 g/dL (ref 11.1–15.9)
Immature Grans (Abs): 0 x10E3/uL (ref 0.0–0.1)
Immature Granulocytes: 0 %
Lymphocytes Absolute: 1.1 x10E3/uL (ref 0.7–3.1)
Lymphs: 18 %
MCH: 31.1 pg (ref 26.6–33.0)
MCHC: 32.6 g/dL (ref 31.5–35.7)
MCV: 96 fL (ref 79–97)
Monocytes Absolute: 0.5 x10E3/uL (ref 0.1–0.9)
Monocytes: 8 %
Neutrophils Absolute: 3.8 x10E3/uL (ref 1.4–7.0)
Neutrophils: 66 %
Platelets: 283 x10E3/uL (ref 150–450)
RBC: 4.56 x10E6/uL (ref 3.77–5.28)
RDW: 12.4 % (ref 11.7–15.4)
WBC: 5.8 x10E3/uL (ref 3.4–10.8)

## 2024-11-08 LAB — TSH: TSH: 2.33 u[IU]/mL (ref 0.450–4.500)

## 2024-11-08 LAB — LIPID PANEL W/O CHOL/HDL RATIO
Cholesterol, Total: 137 mg/dL (ref 100–199)
HDL: 53 mg/dL
LDL Chol Calc (NIH): 68 mg/dL (ref 0–99)
Triglycerides: 81 mg/dL (ref 0–149)
VLDL Cholesterol Cal: 16 mg/dL (ref 5–40)

## 2024-11-08 LAB — COMPREHENSIVE METABOLIC PANEL WITH GFR
ALT: 10 IU/L (ref 0–32)
AST: 18 IU/L (ref 0–40)
Albumin: 4.5 g/dL (ref 3.8–4.9)
Alkaline Phosphatase: 57 IU/L (ref 49–135)
BUN/Creatinine Ratio: 14 (ref 9–23)
BUN: 13 mg/dL (ref 6–24)
Bilirubin Total: 0.7 mg/dL (ref 0.0–1.2)
CO2: 23 mmol/L (ref 20–29)
Calcium: 9.3 mg/dL (ref 8.7–10.2)
Chloride: 102 mmol/L (ref 96–106)
Creatinine, Ser: 0.94 mg/dL (ref 0.57–1.00)
Globulin, Total: 2.5 g/dL (ref 1.5–4.5)
Glucose: 87 mg/dL (ref 70–99)
Potassium: 3.8 mmol/L (ref 3.5–5.2)
Sodium: 138 mmol/L (ref 134–144)
Total Protein: 7 g/dL (ref 6.0–8.5)
eGFR: 73 mL/min/1.73

## 2024-11-08 LAB — HEPATITIS B SURFACE ANTIBODY, QUANTITATIVE: Hepatitis B Surf Ab Quant: <3.5 m[IU]/mL — ABNORMAL LOW

## 2024-11-08 LAB — VITAMIN D 25 HYDROXY (VIT D DEFICIENCY, FRACTURES): Vit D, 25-Hydroxy: 50.2 ng/mL (ref 30.0–100.0)

## 2024-11-10 ENCOUNTER — Ambulatory Visit: Payer: Self-pay | Admitting: Family Medicine

## 2024-11-10 MED ORDER — LEVOTHYROXINE SODIUM 50 MCG PO TABS: 50.0000 ug | ORAL_TABLET | Freq: Every day | ORAL | 3 refills | Status: AC

## 2024-11-11 LAB — CYTOLOGY - PAP
Adequacy: ABSENT
Comment: NEGATIVE
Diagnosis: NEGATIVE
High risk HPV: NEGATIVE

## 2024-12-04 ENCOUNTER — Other Ambulatory Visit: Payer: Self-pay | Admitting: Family Medicine

## 2024-12-05 NOTE — Telephone Encounter (Signed)
 Requested Prescriptions  Pending Prescriptions Disp Refills   albuterol  (VENTOLIN  HFA) 108 (90 Base) MCG/ACT inhaler [Pharmacy Med Name: ALBUTEROL  HFA INH (200 PUFFS) 8.5GM] 8.5 g 5    Sig: INHALE 2 PUFFS INTO THE LUNGS EVERY 6 HOURS AS NEEDED FOR WHEEZING OR SHORTNESS OF BREATH     Pulmonology:  Beta Agonists 2 Failed - 12/05/2024 12:04 PM      Failed - Last BP in normal range    BP Readings from Last 1 Encounters:  11/07/24 (!) 144/88         Passed - Last Heart Rate in normal range    Pulse Readings from Last 1 Encounters:  11/07/24 87         Passed - Valid encounter within last 12 months    Recent Outpatient Visits           4 weeks ago Routine general medical examination at a health care facility   [facility], [place] P, DO   2 months ago Panic disorder without agoraphobia   [place] [facility] [place], [place] P, DO   3 months ago Class 1 obesity due to excess calories with serious comorbidity and body mass index (BMI) of 33.0 to 33.9 in adult   [facility], [place] P, DO   5 months ago Class 2 obesity due to excess calories without serious comorbidity with body mass index (BMI) of 38.0 to 38.9 in adult   [facility] [place], [place] P, DO   6 months ago Panic disorder without agoraphobia    [facility] SQUIBB, DO       Future Appointments             In 3 months Jackquline Sawyer, MD [facility]

## 2024-12-08 ENCOUNTER — Ambulatory Visit
Admission: RE | Admit: 2024-12-08 | Discharge: 2024-12-08 | Disposition: A | Source: Ambulatory Visit | Attending: Family Medicine

## 2024-12-08 DIAGNOSIS — Z1231 Encounter for screening mammogram for malignant neoplasm of breast: Secondary | ICD-10-CM | POA: Diagnosis present

## 2024-12-20 ENCOUNTER — Other Ambulatory Visit: Payer: Self-pay | Admitting: Family Medicine

## 2024-12-23 ENCOUNTER — Telehealth: Payer: Self-pay

## 2024-12-23 ENCOUNTER — Ambulatory Visit

## 2024-12-23 NOTE — Telephone Encounter (Signed)
 Requested Prescriptions  Pending Prescriptions Disp Refills   mirtazapine  (REMERON ) 15 MG tablet [Pharmacy Med Name: MIRTAZAPINE  15MG  TABLETS] 135 tablet 1    Sig: TAKE 1 AND 1/2 TABLETS(22.5 MG) BY MOUTH AT BEDTIME     Psychiatry: Antidepressants - mirtazapine  Passed - 12/23/2024  7:45 AM      Passed - Valid encounter within last 6 months    Recent Outpatient Visits           1 month ago Routine general medical examination at a health care facility   [facility], [place] P, DO   3 months ago Panic disorder without agoraphobia   [facility] [place], Megan P, DO   4 months ago Class 1 obesity due to excess calories with serious comorbidity and body mass index (BMI) of 33.0 to 33.9 in adult   [facility], Megan P, DO   5 months ago Class 2 obesity due to excess calories without serious comorbidity with body mass index (BMI) of 38.0 to 38.9 in adult   [facility] [place], [place] P, DO   6 months ago Panic disorder without agoraphobia   [facility] [facility] Duwaine SQUIBB, DO       Future Appointments             In 2 months Jackquline Sawyer, MD [facility]

## 2024-12-23 NOTE — Progress Notes (Addendum)
 Patient is in office today for a nurse visit to be coached on how to give Zepbound  Injection. Patient Injection was given in the  Left lower quad. abdomen. Patient tolerated injection well. Patient self administered medication patient supply

## 2024-12-23 NOTE — Telephone Encounter (Signed)
 Copied from CRM [id number]. Topic: Clinical - Medical Advice >> Dec 23, 2024  8:12 AM Sherry Gonzalez wrote: Reason for CRM: Patient called.. rcvd her (ZEPBOUND ).. wants to know if she should come back today or tomorrow to be shown how to use it - time.. due to the time of when she is suppose to.  Pls call her back

## 2024-12-30 ENCOUNTER — Ambulatory Visit

## 2025-02-05 ENCOUNTER — Ambulatory Visit: Admitting: Family Medicine

## 2025-03-17 ENCOUNTER — Ambulatory Visit: Admitting: Dermatology
